# Patient Record
Sex: Female | Born: 1970 | Race: White | Hispanic: No | Marital: Married | Smoking: Never smoker
Health system: Southern US, Community
[De-identification: ages and names within clinical notes are randomized; demographics above are authoritative.]

## PROBLEM LIST (undated history)

## (undated) DIAGNOSIS — K589 Irritable bowel syndrome without diarrhea: Secondary | ICD-10-CM

## (undated) DIAGNOSIS — R519 Headache, unspecified: Secondary | ICD-10-CM

## (undated) DIAGNOSIS — F419 Anxiety disorder, unspecified: Secondary | ICD-10-CM

## (undated) DIAGNOSIS — I209 Angina pectoris, unspecified: Secondary | ICD-10-CM

## (undated) DIAGNOSIS — I10 Essential (primary) hypertension: Secondary | ICD-10-CM

## (undated) DIAGNOSIS — T7840XA Allergy, unspecified, initial encounter: Secondary | ICD-10-CM

## (undated) DIAGNOSIS — R51 Headache: Secondary | ICD-10-CM

## (undated) DIAGNOSIS — K648 Other hemorrhoids: Secondary | ICD-10-CM

## (undated) DIAGNOSIS — D239 Other benign neoplasm of skin, unspecified: Secondary | ICD-10-CM

## (undated) DIAGNOSIS — F909 Attention-deficit hyperactivity disorder, unspecified type: Secondary | ICD-10-CM

## (undated) DIAGNOSIS — Z8041 Family history of malignant neoplasm of ovary: Secondary | ICD-10-CM

## (undated) DIAGNOSIS — K219 Gastro-esophageal reflux disease without esophagitis: Secondary | ICD-10-CM

## (undated) HISTORY — DX: Headache, unspecified: R51.9

## (undated) HISTORY — PX: COLONOSCOPY: SHX174

## (undated) HISTORY — DX: Headache: R51

## (undated) HISTORY — DX: Other hemorrhoids: K64.8

## (undated) HISTORY — DX: Allergy, unspecified, initial encounter: T78.40XA

## (undated) HISTORY — DX: Family history of malignant neoplasm of ovary: Z80.41

## (undated) HISTORY — DX: Irritable bowel syndrome, unspecified: K58.9

## (undated) HISTORY — DX: Anxiety disorder, unspecified: F41.9

## (undated) HISTORY — PX: ABDOMINAL HYSTERECTOMY: SHX81

## (undated) HISTORY — PX: UPPER GASTROINTESTINAL ENDOSCOPY: SHX188

---

## 1998-07-16 ENCOUNTER — Other Ambulatory Visit: Admission: RE | Admit: 1998-07-16 | Discharge: 1998-07-16 | Payer: Self-pay | Admitting: Obstetrics and Gynecology

## 1998-07-16 ENCOUNTER — Ambulatory Visit (HOSPITAL_COMMUNITY): Admission: AD | Admit: 1998-07-16 | Discharge: 1998-07-16 | Payer: Self-pay | Admitting: Obstetrics and Gynecology

## 1998-07-16 ENCOUNTER — Encounter: Payer: Self-pay | Admitting: Obstetrics and Gynecology

## 1998-08-18 ENCOUNTER — Inpatient Hospital Stay (HOSPITAL_COMMUNITY): Admission: AD | Admit: 1998-08-18 | Discharge: 1998-08-18 | Payer: Self-pay | Admitting: Obstetrics and Gynecology

## 1998-08-19 ENCOUNTER — Ambulatory Visit (HOSPITAL_COMMUNITY): Admission: AD | Admit: 1998-08-19 | Discharge: 1998-08-19 | Payer: Self-pay | Admitting: *Deleted

## 1998-08-19 ENCOUNTER — Encounter: Payer: Self-pay | Admitting: *Deleted

## 2000-02-19 ENCOUNTER — Other Ambulatory Visit: Admission: RE | Admit: 2000-02-19 | Discharge: 2000-02-19 | Payer: Self-pay | Admitting: Family Medicine

## 2000-09-22 ENCOUNTER — Other Ambulatory Visit: Admission: RE | Admit: 2000-09-22 | Discharge: 2000-09-22 | Payer: Self-pay | Admitting: Gynecology

## 2001-01-19 ENCOUNTER — Inpatient Hospital Stay (HOSPITAL_COMMUNITY): Admission: RE | Admit: 2001-01-19 | Discharge: 2001-01-21 | Payer: Self-pay | Admitting: Gynecology

## 2004-05-08 ENCOUNTER — Ambulatory Visit (HOSPITAL_BASED_OUTPATIENT_CLINIC_OR_DEPARTMENT_OTHER): Admission: RE | Admit: 2004-05-08 | Discharge: 2004-05-08 | Payer: Self-pay | Admitting: Orthopedic Surgery

## 2004-05-08 HISTORY — PX: OTHER SURGICAL HISTORY: SHX169

## 2004-06-28 ENCOUNTER — Inpatient Hospital Stay: Payer: Self-pay | Admitting: General Surgery

## 2004-07-15 ENCOUNTER — Ambulatory Visit: Payer: Self-pay | Admitting: General Surgery

## 2005-03-20 ENCOUNTER — Ambulatory Visit: Payer: Self-pay | Admitting: Urology

## 2009-04-04 ENCOUNTER — Ambulatory Visit: Payer: Self-pay | Admitting: Otolaryngology

## 2009-05-29 ENCOUNTER — Ambulatory Visit: Payer: Self-pay | Admitting: Internal Medicine

## 2009-09-25 ENCOUNTER — Ambulatory Visit: Payer: Self-pay | Admitting: Internal Medicine

## 2009-09-30 ENCOUNTER — Emergency Department: Payer: Self-pay | Admitting: Emergency Medicine

## 2010-05-26 ENCOUNTER — Emergency Department: Payer: Self-pay | Admitting: Internal Medicine

## 2010-11-25 ENCOUNTER — Emergency Department: Payer: Self-pay | Admitting: Emergency Medicine

## 2012-01-01 ENCOUNTER — Encounter (HOSPITAL_BASED_OUTPATIENT_CLINIC_OR_DEPARTMENT_OTHER): Payer: Self-pay | Admitting: *Deleted

## 2012-01-06 ENCOUNTER — Ambulatory Visit (HOSPITAL_BASED_OUTPATIENT_CLINIC_OR_DEPARTMENT_OTHER): Admission: RE | Admit: 2012-01-06 | Payer: BC Managed Care – PPO | Source: Ambulatory Visit | Admitting: Gynecology

## 2012-01-06 ENCOUNTER — Encounter (HOSPITAL_BASED_OUTPATIENT_CLINIC_OR_DEPARTMENT_OTHER): Admission: RE | Payer: Self-pay | Source: Ambulatory Visit

## 2012-01-06 HISTORY — DX: Other benign neoplasm of skin, unspecified: D23.9

## 2012-01-06 SURGERY — VULVAR LESION
Anesthesia: Moderate Sedation

## 2013-06-28 ENCOUNTER — Ambulatory Visit: Payer: Self-pay | Admitting: Family Medicine

## 2014-12-18 ENCOUNTER — Other Ambulatory Visit: Payer: Self-pay

## 2014-12-18 DIAGNOSIS — Z1231 Encounter for screening mammogram for malignant neoplasm of breast: Secondary | ICD-10-CM

## 2015-01-03 ENCOUNTER — Ambulatory Visit: Payer: Self-pay | Attending: Family Medicine

## 2015-12-09 ENCOUNTER — Encounter: Payer: Self-pay | Admitting: Physician Assistant

## 2015-12-23 ENCOUNTER — Encounter: Payer: Self-pay | Admitting: Physician Assistant

## 2015-12-23 ENCOUNTER — Encounter (INDEPENDENT_AMBULATORY_CARE_PROVIDER_SITE_OTHER): Payer: Self-pay

## 2015-12-23 ENCOUNTER — Ambulatory Visit (INDEPENDENT_AMBULATORY_CARE_PROVIDER_SITE_OTHER): Payer: BLUE CROSS/BLUE SHIELD | Admitting: Physician Assistant

## 2015-12-23 VITALS — BP 106/70 | HR 76 | Ht 66.0 in | Wt 191.6 lb

## 2015-12-23 DIAGNOSIS — R1011 Right upper quadrant pain: Secondary | ICD-10-CM | POA: Diagnosis not present

## 2015-12-23 DIAGNOSIS — R1013 Epigastric pain: Secondary | ICD-10-CM | POA: Diagnosis not present

## 2015-12-23 DIAGNOSIS — K5900 Constipation, unspecified: Secondary | ICD-10-CM | POA: Diagnosis not present

## 2015-12-23 DIAGNOSIS — Z1211 Encounter for screening for malignant neoplasm of colon: Secondary | ICD-10-CM | POA: Diagnosis not present

## 2015-12-23 DIAGNOSIS — Z1212 Encounter for screening for malignant neoplasm of rectum: Secondary | ICD-10-CM

## 2015-12-23 NOTE — Progress Notes (Addendum)
Chief Complaint: Right upper quadrant abdominal pain, dyspepsia and constipation  HPI:  Mrs. Woodliff is a 45 year old Caucasian female with a past medical history of IBS and anxiety who was referred to me by Lavonne Chick, MD for a complaint of right upper quadrant abdominal pain, dyspepsia and constipation. The patient has never been seen in our clinic before.  Review of records shows a colonoscopy completed on 06/11/2010 by Dr. Phylis Bougie for screening and right lower quadrant abdominal pain. Impression at that time was of diverticulosis in the mid sigmoid colon and otherwise normal colonoscopy. Repeat was recommended in 5 years for surveillance. Patient also had an EGD at that time, independent review of report and images shows a medium-sized hiatal hernia, gastritis and a normal nasopharynx. Pathology revealed mild reactive gastropathy. Review of previous notes shows that the patient had an ultrasound on 2/12 which revealed a liver cyst but no gallstones. She also had a CT scan at that time which showed some constipation. The patient was diagnosed with irritable bowel syndrome after colonoscopy and EGD were unrevealing.  Today, the patient tells me that for the past 6-7 months she has had a dull right upper quadrant pain which is at a constant 1-2 /10, this worsens about 30 minutes to an hour after eating and will increase to an 8/10. This will last for an hour to an hour and a half and then slowly go away on its own. She has tried heating pads and other medicines during times of pain and "nothing helps". The patient believes this is increased in frequency over the past couple of months. Over this time, the patient has also been experiencing morning nausea which results in several episodes of vomiting. This has become more frequent as well, at least 4-5 days out of the week. This seems to be worse when the patient has not had a bowel movement in a few days. She does tell me that she suffered from  IBS-D in the past and currently this has changed towards constipation over this same amount of time. Patient tells me that if she has not had a bowel movement a few days she will definitely wake up "nauseated and vomit". The patient also tells me that she "always feels that there is something in her throat", though she denies dysphagia symptoms or anything ever "getting stuck". Patient does have some heartburn 1-2 days a week but denies reflux. She also describes some increase in belching over the past few months.  Patient tells me that she has tried to increase fiber in her diet using 2 doses of Metamucil a day and one dose of MiraLAX a day, she has been doing this for the past 3-4 weeks. She believes this "has helped some", but currently she has not had a bowel movement since Friday and has been doing all of these things. She also tried a colonic 3 weeks ago which made her feel somewhat better for a day and a half. She does believe her nausea and vomiting are related to constipation. Patient tells me that she was on Bentyl remotely a while ago, which helped. She did stop this 2 years ago. Patient also uses a daily probiotic, "true biotic", she has been on this for years.  Patient denies fever, chills, blood in her stool, melena, change in diet, recent travel, increase in stress, recent antibiotics, weight loss, fatigue, anorexia or use of NSAIDs.    Past Medical History:  Diagnosis Date  . Anxiety   .  Hidradenoma VULVAR  . IBS (irritable bowel syndrome)     Past Surgical History:  Procedure Laterality Date  . ABDOMINAL HYSTERECTOMY  01-19-2001  DR LOMAX   W/ LEFT SALPINGO-OOPHORECTOMY AND EXCISION ENDOMETRIOSIS  . CESAREAN SECTION    . REMOVAL OF SIDE PLATE, DISTAL FIBULA, RIGHT ANKLE  05-08-2004    Current Outpatient Prescriptions  Medication Sig Dispense Refill  . estradiol (ESTRACE) 0.5 MG tablet Take 0.5 mg by mouth daily.    . SUMAtriptan (IMITREX) 50 MG tablet Take 50 mg by mouth  every 2 (two) hours as needed for migraine. May repeat in 2 hours if headache persists or recurs.     No current facility-administered medications for this visit.     Allergies as of 12/23/2015 - Review Complete 12/23/2015  Allergen Reaction Noted  . Sulfa antibiotics Hives and Itching 12/23/2015    Family History  Problem Relation Age of Onset  . Ovarian cancer Mother   . Uterine cancer Mother   . Diabetes Father   . Kidney disease Paternal Grandmother   . Esophageal cancer Neg Hx   . Colon cancer Neg Hx   . Stomach cancer Neg Hx   . Pancreatic cancer Neg Hx   . Liver disease Neg Hx     Social History   Social History  . Marital status: Married    Spouse name: N/A  . Number of children: N/A  . Years of education: N/A   Occupational History  . Not on file.   Social History Main Topics  . Smoking status: Never Smoker  . Smokeless tobacco: Never Used  . Alcohol use No  . Drug use: No  . Sexual activity: Not on file   Other Topics Concern  . Not on file   Social History Narrative  . No narrative on file    Review of Systems:     Constitutional: No weight loss, fever, chills, weakness or fatigue HEENT: Eyes: No change in vision               Ears, Nose, Throat:  No change in hearing or congestion Skin: No rash or itching Cardiovascular: No chest pain, chest pressure or palpitations   Respiratory: No SOB or cough Gastrointestinal: See HPI and otherwise negative Genitourinary: No dysuria or change in urinary frequency Neurological: No headache, dizziness or syncope Musculoskeletal: No new muscle or joint pain Hematologic: No bleeding or bruising Psychiatric: Positive history of anxiety No history of depression     Physical Exam:  Vital signs: Ht 5\' 6"  (1.676 m)   Wt 191 lb 9.6 oz (86.9 kg)   BMI 30.93 kg/m   General:   Pleasant Caucasian female appears to be in NAD, Well developed, Well nourished, alert and cooperative Head:  Normocephalic and  atraumatic. Eyes:   PEERL, EOMI. No icterus. Conjunctiva pink. Ears:  Normal auditory acuity. Neck:  Supple Throat: Oral cavity and pharynx without inflammation, swelling or lesion. Teeth in good condition. Lungs: Respirations even and unlabored. Lungs clear to auscultation bilaterally.   No wheezes, crackles, or rhonchi.  Heart: Normal S1, S2. No MRG. Regular rate and rhythm. No peripheral edema, cyanosis or pallor.  Abdomen:  Soft, nondistended, moderate tenderness in the right upper quadrant with some involuntary guarding No rebound. Decreased bowel sounds in all 4 quadrants No appreciable masses or hepatomegaly. Rectal:  Not performed.  Msk:  Symmetrical without gross deformities. Peripheral pulses intact.  Extremities:  Without edema, no deformity or joint abnormality. Normal ROM. Neurologic:  Alert and  oriented x4;  grossly normal neurologically. CN II-XII intact.  Skin:   Dry and intact without significant lesions or rashes. Psychiatric: Oriented to person, place and time. Demonstrates good judgement and reason without abnormal affect or behaviors.  Recent labs by PCP, we do not have a record at time of patient's visit.  Assessment: 1. Right upper quadrant abdominal pain: For the past 6-7 months, worse 30 minutes to an hour after eating, nothing relieves this pain, ultrasound in 2012 unrevealing, CT around that time showed no stones, no history of HIDA scan, EGD 2012 revealed gastritis; consider gallbladder etiology versus constipation versus gastritis versus other 2. Dyspepsia: Patient with nausea and vomiting "most mornings", worse when she is constipated; question relation to constipation versus gastritis versus gallbladder etiology versus IBS/functional dyspepsia 3. Constipation: Recent change for the patient over the past 6-7 months, previously she had diarrhea, no help from 1 dose of MiraLAX qd and addition of fiber supplement; consider IBS C versus other 4. Screening colonoscopy:  Last colonoscopy records from 2012 with diverticulosis and otherwise normal, repeat was recommended in 5 years, we will proceed at this time  Plan: 1. At this time discussed that her symptoms of nausea and vomiting could be related to her recent change to constipation. Recommend she increase fiber in her diet to 25-35 g per day with the use of fiber supplement such as Metamucil, Citrucel or Benefiber. Also recommend she increase water intake to at least 6-8 8 ounce glasses of water per day. She can also increase exercise. 2. We did discuss that she should use MiraLAX at least twice daily, she can use this up to 4 times a day. She should titrate this to regular formed but soft stools. She verbalized understanding. 3. Did provide the patient with information regarding a low FODMAP diet if she would like to try this in the future. We discussed this in detail. 4. Recommend the patient switch probiotics at this time to [place]. She should continue this for at least 2 months. 5. Ordered right upper quadrant ultrasound for further evaluation of gallbladder etiology. If this is normal/negative would recommend a HIDA scan for further evaluation. 6. Ordered colonoscopy for screening purposes, per patient's last colonoscopy report she was due for repeat in 5 years. Also with change in bowel habits recently, this would be good timing. Discussed risks, benefits, limitations and alternatives and the patient agrees to proceed. This was scheduled with Dr. Carlean Purl as the patient is new to our clinic. 7. In the future if ultrasound unrevealing and colonoscopy normal, could consider OTC anti-secretory medication as the patient does have a history of gastritis via EGD in 2012. 8. Patient to follow in clinic 2-3 weeks after upcoming colonoscopy and ultrasound evaluation. This can be with myself or Dr. Carlean Purl. 9. Requested recent labs from PCP.  Thank you for your kind referral, it was a pleasure to see Mrs. Cata this  morning. Please let us know if any of any further help or assistance.  Ellouise Newer, PA-C [facility] 12/23/2015, 9:02 AM  Cc: Lavonne Chick, MD   _______________________________________________________________________________________________ Addendum: Received patient's recent labs drawn 11/11/15 including a CBC with a hemoglobin low at 10.3, MCV low at 73 and otherwise normal. CMP showed a low calcium at 8.3 and was otherwise normal. Lipid panel showed a high LDL at 131 and was otherwise normal. TSH was normal. Hemoglobin A1c was normal. JLL 12/24/15 0855

## 2015-12-23 NOTE — Patient Instructions (Addendum)
You have been scheduled for a colonoscopy. Please follow written instructions given to you at your visit today.  Please pick up your prep supplies at the pharmacy within the next 1-3 days. If you use inhalers (even only as needed), please bring them with you on the day of your procedure. Your physician has requested that you go to www.startemmi.com and enter the access code given to you at your visit today. This web site gives a general overview about your procedure. However, you should still follow specific instructions given to you by our office regarding your preparation for the procedure.  You have been scheduled for an abdominal ultrasound at [facility] (1st floor of hospital) on 12/27/15 at 11:30 am. Please arrive 15 minutes prior to your appointment for registration. Make certain not to have anything to eat or drink 6 hours prior to your appointment. Should you need to reschedule your appointment, please contact radiology at [phone]. This test typically takes about 30 minutes to perform.  We have given you a  Handout on a low FODMAP diet.   We have given you a coupon for [employer].   Please follow up with Ellouise Newer, PA-C or Dr. Carlean Purl after your colonoscopy.

## 2015-12-24 ENCOUNTER — Encounter: Payer: Self-pay | Admitting: Internal Medicine

## 2015-12-24 NOTE — Progress Notes (Signed)
Agree w/ Nancy Freeman's management 

## 2015-12-27 ENCOUNTER — Ambulatory Visit (HOSPITAL_COMMUNITY)
Admission: RE | Admit: 2015-12-27 | Discharge: 2015-12-27 | Disposition: A | Discharge status: Home / Self Care | Payer: BLUE CROSS/BLUE SHIELD | Source: Ambulatory Visit | Attending: Physician Assistant | Admitting: Physician Assistant

## 2015-12-27 DIAGNOSIS — R1011 Right upper quadrant pain: Secondary | ICD-10-CM | POA: Diagnosis present

## 2015-12-30 ENCOUNTER — Ambulatory Visit (AMBULATORY_SURGERY_CENTER): Payer: BLUE CROSS/BLUE SHIELD | Admitting: Internal Medicine

## 2015-12-30 ENCOUNTER — Encounter: Payer: Self-pay | Admitting: Internal Medicine

## 2015-12-30 VITALS — BP 132/77 | HR 63 | Temp 98.7°F | Resp 18 | Ht 66.0 in | Wt 191.0 lb

## 2015-12-30 DIAGNOSIS — Z1212 Encounter for screening for malignant neoplasm of rectum: Secondary | ICD-10-CM

## 2015-12-30 DIAGNOSIS — Z8041 Family history of malignant neoplasm of ovary: Secondary | ICD-10-CM

## 2015-12-30 DIAGNOSIS — K635 Polyp of colon: Secondary | ICD-10-CM | POA: Diagnosis not present

## 2015-12-30 DIAGNOSIS — Z1211 Encounter for screening for malignant neoplasm of colon: Secondary | ICD-10-CM | POA: Diagnosis present

## 2015-12-30 DIAGNOSIS — Z8049 Family history of malignant neoplasm of other genital organs: Secondary | ICD-10-CM

## 2015-12-30 DIAGNOSIS — D125 Benign neoplasm of sigmoid colon: Secondary | ICD-10-CM

## 2015-12-30 MED ORDER — SODIUM CHLORIDE 0.9 % IV SOLN
500.0000 mL | INTRAVENOUS | Status: DC
Start: 2015-12-30 — End: 2016-05-01

## 2015-12-30 NOTE — Progress Notes (Signed)
A/ox3, pleased with MAC, report to RN 

## 2015-12-30 NOTE — Op Note (Addendum)
[facility] Patient Name: Nancy Freeman Procedure Date: 12/30/2015 7:54 AM MRN: [id number] Endoscopist: Gatha Mayer , MD Age: 45 Referring MD:  Date of Birth: [birth year] Gender: Female Account #: [id number] Procedure:                Colonoscopy Indications:              Family history of endometrial cancer in a                            first-degree relative, Family history of ovarian                            cancer in a first-degree relative Medicines:                Propofol per Anesthesia, Monitored Anesthesia Care Procedure:                Pre-Anesthesia Assessment:                           - Prior to the procedure, a History and Physical                            was performed, and patient medications and                            allergies were reviewed. The patient's tolerance of                            previous anesthesia was also reviewed. The risks                            and benefits of the procedure and the sedation                            options and risks were discussed with the patient.                            All questions were answered, and informed consent                            was obtained. Prior Anticoagulants: The patient has                            taken no previous anticoagulant or antiplatelet                            agents. ASA Grade Assessment: II - A patient with                            mild systemic disease. After reviewing the risks                            and benefits, the patient was deemed in  satisfactory condition to undergo the procedure.                           After obtaining informed consent, the colonoscope                            was passed under direct vision. Throughout the                            procedure, the patient's blood pressure, pulse, and                            oxygen saturations were monitored continuously. The                            Model  CF-HQ190L [id number]) scope was introduced                            through the anus and advanced to the the terminal                            ileum, with identification of the appendiceal                            orifice and IC valve. The colonoscopy was performed                            without difficulty. The patient tolerated the                            procedure well. The quality of the bowel                            preparation was excellent. The bowel preparation                            used was Miralax. The terminal ileum, the                            appendiceal orifice and the rectum were                            photographed. Scope In: 8:06:13 AM Scope Out: 8:25:31 AM Scope Withdrawal Time: 0 hours 12 minutes 53 seconds  Total Procedure Duration: 0 hours 19 minutes 18 seconds  Findings:                 The perianal and digital rectal examinations were                            normal.                           Three sessile polyps were found in the sigmoid  colon and distal sigmoid colon. The polyps were 3                            to 4 mm in size. These polyps were removed with a                            cold snare. Resection and retrieval were complete.                            Verification of patient identification for the                            specimen was done. Estimated blood loss was minimal.                           The terminal ileum appeared normal.                           The exam was otherwise without abnormality on                            direct and retroflexion views. Complications:            No immediate complications. Estimated Blood Loss:     Estimated blood loss was minimal. Impression:               - Three 3 to 4 mm polyps in the sigmoid colon and                            in the distal sigmoid colon, removed with a cold                            snare. Resected and retrieved.                            - The examined portion of the ileum was normal.                           - The examination was otherwise normal on direct                            and retroflexion views. Recommendation:           - Patient has a contact number available for                            emergencies. The signs and symptoms of potential                            delayed complications were discussed with the                            patient. Return to normal activities tomorrow.  Written discharge instructions were provided to the                            patient.                           - Resume previous diet.                           - Continue present medications.                           - Await pathology results.                           - Repeat colonoscopy is recommended. The                            colonoscopy date will be determined after pathology                            results from today's exam become available for                            review.                           -                           WILL SCHEDULE HIDA SCAN WITH EJECTION FRACTION TO                            EVALUATE RUQ PAIN - OFFICE WILL ARRANGE THIS Gatha Mayer, MD 12/30/2015 8:36:48 AM This report has been signed electronically.

## 2015-12-30 NOTE — Progress Notes (Signed)
[place] Dr. Carlean Purl in to speak with pt. And aware of pt. Pain and Nausea and informed pt. And husband that he was going to have office to set up [place] Scan to check her gallbladder for her symptoms she is having,they verbalized understanding.

## 2015-12-30 NOTE — Patient Instructions (Addendum)
I found and removed 3 small polyps that have nothing to do with symptoms.  Push the fiber, use MiraLax and you will be set up for a HIDA scan (radiology test) to check for how well your gallbladder functions.  I appreciate the opportunity to care for you. Gatha Mayer, MD, FACG       YOU HAD AN ENDOSCOPIC PROCEDURE TODAY AT [facility]:   Refer to the procedure report that was given to you for any specific questions about what was found during the examination.  If the procedure report does not answer your questions, please call your gastroenterologist to clarify.  If you requested that your care partner not be given the details of your procedure findings, then the procedure report has been included in a sealed envelope for you to review at your convenience later.  YOU SHOULD EXPECT: Some feelings of bloating in the abdomen. Passage of more gas than usual.  Walking can help get rid of the air that was put into your GI tract during the procedure and reduce the bloating. If you had a lower endoscopy (such as a colonoscopy or flexible sigmoidoscopy) you may notice spotting of blood in your stool or on the toilet paper. If you underwent a bowel prep for your procedure, you may not have a normal bowel movement for a few days.  Please Note:  You might notice some irritation and congestion in your nose or some drainage.  This is from the oxygen used during your procedure.  There is no need for concern and it should clear up in a day or so.  SYMPTOMS TO REPORT IMMEDIATELY:   Following lower endoscopy (colonoscopy or flexible sigmoidoscopy):  Excessive amounts of blood in the stool  Significant tenderness or worsening of abdominal pains  Swelling of the abdomen that is new, acute  Fever of 100F or higher   For urgent or emergent issues, a gastroenterologist can be reached at any hour by calling [phone].   DIET:  We do recommend a small meal at first, but then  you may proceed to your regular diet.  Drink plenty of fluids but you should avoid alcoholic beverages for 24 hours.  ACTIVITY:  You should plan to take it easy for the rest of today and you should NOT DRIVE or use heavy machinery until tomorrow (because of the sedation medicines used during the test).    FOLLOW UP: Our staff will call the number listed on your records the next business day following your procedure to check on you and address any questions or concerns that you may have regarding the information given to you following your procedure. If we do not reach you, we will leave a message.  However, if you are feeling well and you are not experiencing any problems, there is no need to return our call.  We will assume that you have returned to your regular daily activities without incident.  If any biopsies were taken you will be contacted by phone or by letter within the next 1-3 weeks.  Please call us at [phone] if you have not heard about the biopsies in 3 weeks.    SIGNATURES/CONFIDENTIALITY: You and/or your care partner have signed paperwork which will be entered into your electronic medical record.  These signatures attest to the fact that that the information above on your After Visit Summary has been reviewed and is understood.  Full responsibility of the confidentiality of this discharge information lies  with you and/or your care-partner.    Resume medications. Information given on polyps.

## 2015-12-31 ENCOUNTER — Telehealth: Payer: Self-pay

## 2015-12-31 ENCOUNTER — Other Ambulatory Visit: Payer: Self-pay

## 2015-12-31 DIAGNOSIS — R1011 Right upper quadrant pain: Secondary | ICD-10-CM

## 2015-12-31 NOTE — Progress Notes (Signed)
Patient is scheduled HIDA at [facility] on 01/13/16 7:15 arrival for 7:30 am appt.  She is advised to be NPO after midnight.

## 2015-12-31 NOTE — Telephone Encounter (Signed)
  Follow up Call-  Call back number 12/30/2015  Post procedure Call Back phone  # [phone]   Permission to leave phone message Yes  Some recent data might be hidden    Patient was called for follow up after her procedure on 12/30/2015. No answer at the number given for follow up phone call. A message was left on the answering machine.

## 2015-12-31 NOTE — Telephone Encounter (Signed)
  Follow up Call-  Call back number 12/30/2015  Post procedure Call Back phone  # [phone]   Permission to leave phone message Yes  Some recent data might be hidden    Patient was called for follow up after her procedure on 12/30/2015. No answer at the number given for follow up phone call. A message was left on the answering machine.

## 2016-01-03 NOTE — Progress Notes (Signed)
Distal sigmoid polyps were hyperplastic so nect colonoscopy 10 yrs 2027  Place recall  Will notify by My Chart

## 2016-01-10 ENCOUNTER — Other Ambulatory Visit: Payer: Self-pay | Admitting: Family Medicine

## 2016-01-10 DIAGNOSIS — Z1231 Encounter for screening mammogram for malignant neoplasm of breast: Secondary | ICD-10-CM

## 2016-01-13 ENCOUNTER — Encounter (HOSPITAL_COMMUNITY): Payer: Self-pay | Admitting: Radiology

## 2016-01-13 ENCOUNTER — Ambulatory Visit (HOSPITAL_COMMUNITY)
Admission: RE | Admit: 2016-01-13 | Discharge: 2016-01-13 | Disposition: A | Discharge status: Home / Self Care | Payer: BLUE CROSS/BLUE SHIELD | Source: Ambulatory Visit | Attending: Internal Medicine | Admitting: Internal Medicine

## 2016-01-13 DIAGNOSIS — R1011 Right upper quadrant pain: Secondary | ICD-10-CM | POA: Insufficient documentation

## 2016-01-13 MED ORDER — TECHNETIUM TC 99M MEBROFENIN IV KIT
5.5000 | PACK | Freq: Once | INTRAVENOUS | Status: AC | PRN
  Administered 2016-01-13: 5.5 via INTRAVENOUS

## 2016-01-13 NOTE — Progress Notes (Signed)
Normal HIDA and Gallbladder EF My Chart note to patient

## 2016-02-21 ENCOUNTER — Ambulatory Visit: Payer: BLUE CROSS/BLUE SHIELD

## 2016-02-24 HISTORY — PX: HEMORRHOID BANDING: SHX5850

## 2016-02-25 ENCOUNTER — Ambulatory Visit
Admission: RE | Admit: 2016-02-25 | Discharge: 2016-02-25 | Disposition: A | Discharge status: Home / Self Care | Payer: BLUE CROSS/BLUE SHIELD | Source: Ambulatory Visit | Attending: Family Medicine | Admitting: Family Medicine

## 2016-02-25 DIAGNOSIS — Z1231 Encounter for screening mammogram for malignant neoplasm of breast: Secondary | ICD-10-CM | POA: Insufficient documentation

## 2016-04-20 ENCOUNTER — Encounter: Payer: Self-pay | Admitting: Physician Assistant

## 2016-05-01 ENCOUNTER — Encounter: Payer: Self-pay | Admitting: Internal Medicine

## 2016-05-01 ENCOUNTER — Ambulatory Visit (INDEPENDENT_AMBULATORY_CARE_PROVIDER_SITE_OTHER): Payer: BLUE CROSS/BLUE SHIELD | Admitting: Internal Medicine

## 2016-05-01 VITALS — BP 110/84 | HR 72 | Ht 66.0 in | Wt 189.4 lb

## 2016-05-01 DIAGNOSIS — K589 Irritable bowel syndrome without diarrhea: Secondary | ICD-10-CM | POA: Diagnosis not present

## 2016-05-01 DIAGNOSIS — K641 Second degree hemorrhoids: Secondary | ICD-10-CM | POA: Diagnosis not present

## 2016-05-01 NOTE — Patient Instructions (Signed)
HEMORRHOID BANDING PROCEDURE    FOLLOW-UP CARE   1. The procedure you have had should have been relatively painless since the banding of the area involved does not have nerve endings and there is no pain sensation.  The rubber band cuts off the blood supply to the hemorrhoid and the band may fall off as soon as 48 hours after the banding (the band may occasionally be seen in the toilet bowl following a bowel movement). You may notice a temporary feeling of fullness in the rectum which should respond adequately to plain Tylenol or Motrin.  2. Following the banding, avoid strenuous exercise that evening and resume full activity the next day.  A sitz bath (soaking in a warm tub) or bidet is soothing, and can be useful for cleansing the area after bowel movements.     3. To avoid constipation, take two tablespoons of natural wheat bran, natural oat bran, flax, Benefiber or any over the counter fiber supplement and increase your water intake to 7-8 glasses daily.    4. Unless you have been prescribed anorectal medication, do not put anything inside your rectum for two weeks: No suppositories, enemas, fingers, etc.  5. Occasionally, you may have more bleeding than usual after the banding procedure.  This is often from the untreated hemorrhoids rather than the treated one.  Don't be concerned if there is a tablespoon or so of blood.  If there is more blood than this, lie flat with your bottom higher than your head and apply an ice pack to the area. If the bleeding does not stop within a half an hour or if you feel faint, call our office at [phone] or go to the emergency room.  6. Problems are not common; however, if there is a substantial amount of bleeding, severe pain, chills, fever or difficulty passing urine (very rare) or other problems, you should call us at [phone] or report to the nearest emergency room.  7. Do not stay seated continuously for more than 2-3 hours for a day or two  after the procedure.  Tighten your buttock muscles 10-15 times every two hours and take 10-15 deep breaths every 1-2 hours.  Do not spend more than a few minutes on the toilet if you cannot empty your bowel; instead re-visit the toilet at a later time.    Continue FODMAP diet.   Today we are giving you a month's supply of VSL #3 , take one a day.   We will see you back for additional banding on    I appreciate the opportunity to care for you. Silvano Rusk, MD, [facility]

## 2016-05-01 NOTE — Progress Notes (Signed)
   Nancy Freeman 46 y.o. 1970/09/04 [id number]  Assessment & Plan:   Encounter Diagnoses  Name Primary?  . Prolapsed internal hemorrhoids, grade 2 Yes  . Irritable bowel syndrome    Banding begun RTC 2 weeks or so more banding VSL # 3 1/day and continue FODMAPS  I appreciate the opportunity to care for this patient.  ZM:CEYEMV, Terance Hart, MD   Subjective:   Chief Complaint: rectal bleeding  HPI Hx hemorrhoids and is having increase in rectal bleeding and some mucous dc No rectal pain Has IBS and bloating and altered bowel habits better on FODMAPS Asking about starting VSL #3 again as has used in past 12/2015 colonoscopy distal hyperplastic polyp  Medications, allergies, past medical history, past surgical history, family history and social history are reviewed and updated in the EMR.  Review of Systems As above  Objective:   Physical Exam BP 110/84   Pulse 72   Ht 5\' 6"  (1.676 m)   Wt 189 lb 6 oz (85.9 kg)   BMI 30.57 kg/m   Female staff present  Rectal - NL anoderm, nontender no mass   Anoscopy was performed with the patient in the left lateral decubitus position while a chaperone was present and revealed Gr 2 internal hemorrhoids all 3 positions w/ moderate inflammation  PROCEDURE NOTE: The patient presents with symptomatic grade 2  hemorrhoids, requesting rubber band ligation of his/her hemorrhoidal disease.  All risks, benefits and alternative forms of therapy were described and informed consent was obtained.   The anorectum was pre-medicated with 0.125% NTG and 5% lidocaine The decision was made to band the RA internal hemorrhoid, and the [place] was used to perform band ligation without complication.  Digital anorectal examination was then performed to assure proper positioning of the band, and to adjust the banded tissue as required.  The patient was discharged home without pain or other issues.  Dietary and behavioral  recommendations were given and along with follow-up instructions.     The following adjunctive treatments were recommended:  VSL #3, continue FODMAPS  The patient will return in about 2 weeks for  follow-up and possible additional banding as required. No complications were encountered and the patient tolerated the procedure well.

## 2016-05-02 ENCOUNTER — Encounter: Payer: Self-pay | Admitting: Internal Medicine

## 2016-05-19 ENCOUNTER — Encounter: Payer: Self-pay | Admitting: Internal Medicine

## 2016-05-19 ENCOUNTER — Ambulatory Visit (INDEPENDENT_AMBULATORY_CARE_PROVIDER_SITE_OTHER): Payer: BLUE CROSS/BLUE SHIELD | Admitting: Internal Medicine

## 2016-05-19 DIAGNOSIS — K641 Second degree hemorrhoids: Secondary | ICD-10-CM

## 2016-05-19 NOTE — Patient Instructions (Addendum)
HEMORRHOID BANDING PROCEDURE    FOLLOW-UP CARE   1. The procedure you have had should have been relatively painless since the banding of the area involved does not have nerve endings and there is no pain sensation.  The rubber band cuts off the blood supply to the hemorrhoid and the band may fall off as soon as 48 hours after the banding (the band may occasionally be seen in the toilet bowl following a bowel movement). You may notice a temporary feeling of fullness in the rectum which should respond adequately to plain Tylenol or Motrin.  2. Following the banding, avoid strenuous exercise that evening and resume full activity the next day.  A sitz bath (soaking in a warm tub) or bidet is soothing, and can be useful for cleansing the area after bowel movements.     3. To avoid constipation, take two tablespoons of natural wheat bran, natural oat bran, flax, Benefiber or any over the counter fiber supplement and increase your water intake to 7-8 glasses daily.    4. Unless you have been prescribed anorectal medication, do not put anything inside your rectum for two weeks: No suppositories, enemas, fingers, etc.  5. Occasionally, you may have more bleeding than usual after the banding procedure.  This is often from the untreated hemorrhoids rather than the treated one.  Don't be concerned if there is a tablespoon or so of blood.  If there is more blood than this, lie flat with your bottom higher than your head and apply an ice pack to the area. If the bleeding does not stop within a half an hour or if you feel faint, call our office at [phone] or go to the emergency room.  6. Problems are not common; however, if there is a substantial amount of bleeding, severe pain, chills, fever or difficulty passing urine (very rare) or other problems, you should call us at [phone] or report to the nearest emergency room.  7. Do not stay seated continuously for more than 2-3 hours for a day or two  after the procedure.  Tighten your buttock muscles 10-15 times every two hours and take 10-15 deep breaths every 1-2 hours.  Do not spend more than a few minutes on the toilet if you cannot empty your bowel; instead re-visit the toilet at a later time.    Give it 2 months and see how your doing.  Call us back if any more problems.     I appreciate the opportunity to care for you. Silvano Rusk, MD, [facility]

## 2016-05-19 NOTE — Assessment & Plan Note (Signed)
Right posterior and left lateral banded return as needed continue FODMAPS  VSL #3 for IBS

## 2016-05-19 NOTE — Progress Notes (Signed)
    PROCEDURE NOTE: The patient presents with symptomatic grade 2 hemorrhoids, requesting rubber band ligation of his/her hemorrhoidal disease.  All risks, benefits and alternative forms of therapy were described and informed consent was obtained.   The anorectum was pre-medicated with 0.125% nitroglycerin and 5% lidocaine topical The decision was made to band the RP and LL internal hemorrhoid, and the [place] was used to perform band ligation without complication.  Digital anorectal examination was then performed to assure proper positioning of the band, and to adjust the banded tissue as required.  The patient was discharged home without pain or other issues.  Dietary and behavioral recommendations were given and along with follow-up instructions.     The following adjunctive treatments were recommended:  Continue VSL #3 and FODMAPS diets  The patient will return prn for  follow-up and possible additional banding as required. No complications were encountered and the patient tolerated the procedure well.  I appreciate the opportunity to care for this patient. CC: Lavonne Chick, MD

## 2016-06-30 ENCOUNTER — Encounter: Payer: Self-pay | Admitting: Neurology

## 2016-06-30 ENCOUNTER — Ambulatory Visit (INDEPENDENT_AMBULATORY_CARE_PROVIDER_SITE_OTHER): Payer: BLUE CROSS/BLUE SHIELD | Admitting: Neurology

## 2016-06-30 VITALS — BP 128/80 | HR 78 | Resp 16 | Ht 66.0 in | Wt 188.0 lb

## 2016-06-30 DIAGNOSIS — G43119 Migraine with aura, intractable, without status migrainosus: Secondary | ICD-10-CM | POA: Diagnosis not present

## 2016-06-30 DIAGNOSIS — R0683 Snoring: Secondary | ICD-10-CM | POA: Diagnosis not present

## 2016-06-30 DIAGNOSIS — R400 Somnolence: Secondary | ICD-10-CM

## 2016-06-30 DIAGNOSIS — G43109 Migraine with aura, not intractable, without status migrainosus: Secondary | ICD-10-CM

## 2016-06-30 DIAGNOSIS — R419 Unspecified symptoms and signs involving cognitive functions and awareness: Secondary | ICD-10-CM | POA: Diagnosis not present

## 2016-06-30 DIAGNOSIS — R51 Headache: Secondary | ICD-10-CM

## 2016-06-30 DIAGNOSIS — E669 Obesity, unspecified: Secondary | ICD-10-CM | POA: Diagnosis not present

## 2016-06-30 DIAGNOSIS — R519 Headache, unspecified: Secondary | ICD-10-CM

## 2016-06-30 MED ORDER — ONDANSETRON 4 MG PO TBDP: 4.0000 mg | ORAL_TABLET | Freq: Three times a day (TID) | ORAL | 0 refills | Status: DC | PRN

## 2016-06-30 NOTE — Progress Notes (Signed)
Subjective:    Patient ID: Nancy Freeman is a 46 y.o. female.  HPI     Nancy Age, MD, PhD [facility] [address], [address] [place], [place] [address]  Dear Dr. Kerin Ransom,   I saw your patient, Nancy Freeman, upon your kind request in my neurologic clinic today for initial consultation of her recurrent headaches and concern for visual field defect. The patient is uaccompanied today. As you know, Nancy Freeman is a 46 year old right-handed woman with an underlying medical history of irritable bowel syndrome, anxiety, allergies, and obesity, who reports a migraine episode on 06/23/2016 associated with decrease in vision and also intermittent tunnel vision in the recent few weeks or months. She has a prior history of migraine headaches without aura. I reviewed your eye examination note from 06/24/2016. Overall eye exam was unremarkable, she was started on Imitrex as needed, which has been helpful. She currently feels at baseline with respect her physician and also headache. She has a history of being sensitive to light. This is an ongoing issue, not currently worse. Her migraine history dates back to her early 36s. In 1996 she started Imitrex injections which were very helpful. She has a strong family history of migraines in her mother who passed away about 38 years ago, middle brother who has migraines and her only daughter also has migraines. She has an older brother who has a history of A. fib. Of note, in September 2017 she started weight loss medication, Contrave, and while she has been able to lose about 22 pounds in the past 6-7 months, she has noted an increase in her migraine frequency. She has up to 2-3 migraine headaches per week at this time which is a significant increase in her migraines, she cannot pinpoint any other triggers or contributors. In the past, she has looked into food triggers and kept a diary for that. She has never been on  headache prevention as her headache frequency was not high enough. She may have had a brain scan in the distant past. She did have a head CT in 2011 which was negative. Visual auras have been new to her headache history, she has had visual field restrictions while driving, not associated with a headache but when she had visual field loss recently she did end up having a headache afterwards. Her visual field impairment lasted for approximately a day and a half she estimates. She has woken up with headaches as well. She is fearful of waking up with headaches in the middle of the night or early morning currently and has even gone to sleep after taking Aleve to prevent morning headaches. Of note, she snores, she does not always wake up rested, she has daytime sleepiness, sometimes takes a nap on weekends. She has reduced her caffeine intake and does not drink daily soda or caffeine. She is a nonsmoker and does not typically drink any alcohol. She is married and lives with her husband, they have 1 daughter. She works for [employer]. She has woken herself up with a sense of gasping or choking. She does not have night to night nocturia. Bedtime is around 10:30 and wake up time usually between 5:30 and 6:30 AM. Of note, her migraine is associated with photophobia, nausea and vomiting. She currently does not have any nausea medication available, in the past, she used Phenergan suppository. She feels that the Imitrex is effective but has taken a second pill recently as well.  Her  Past Medical History Is Significant For: Past Medical History:  Diagnosis Date  . Allergy   . Anxiety   . Headache   . Hidradenoma VULVAR  . IBS (irritable bowel syndrome)   . Internal hemorrhoid     Her Past Surgical History Is Significant For: Past Surgical History:  Procedure Laterality Date  . ABDOMINAL HYSTERECTOMY  01-19-2001  DR LOMAX   W/ LEFT SALPINGO-OOPHORECTOMY AND EXCISION ENDOMETRIOSIS  . CESAREAN SECTION    .  COLONOSCOPY    . HEMORRHOID BANDING  2018  . REMOVAL OF SIDE PLATE, DISTAL FIBULA, RIGHT ANKLE  05-08-2004  . UPPER GASTROINTESTINAL ENDOSCOPY      Her Family History Is Significant For: Family History  Problem Relation Freeman of Onset  . Ovarian cancer Mother   . Uterine cancer Mother   . Breast cancer Mother 21  . Diabetes Father   . Kidney disease Paternal Grandmother   . Breast cancer Maternal Aunt 60  . Esophageal cancer Neg Hx   . Colon cancer Neg Hx   . Stomach cancer Neg Hx   . Pancreatic cancer Neg Hx   . Liver disease Neg Hx   . Rectal cancer Neg Hx     Her Social History Is Significant For: Social History   Social History  . Marital status: Married    Spouse name: N/A  . Number of children: 1  . Years of education: BS   Occupational History  . [employer]     she recruits campers   Social History Main Topics  . Smoking status: Never Smoker  . Smokeless tobacco: Never Used  . Alcohol use No  . Drug use: No  . Sexual activity: Not Asked   Other Topics Concern  . 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 Sulfa Antibiotics Hives and Itching  :   Her Current Medications Are:  Outpatient Encounter Prescriptions as of 06/30/2016  Medication Sig  . cetirizine (ZYRTEC) 10 MG tablet Take 10 mg by mouth daily.  Marland Kitchen estradiol (ESTRACE) 0.5 MG tablet Take 0.5 mg by mouth daily.  . Multiple Vitamin (MULTIVITAMIN) capsule Take 1 capsule by mouth daily.  . Naltrexone-Bupropion HCl ER (CONTRAVE) 8-90 MG TB12 Take by mouth 2 (two) times daily.  . SUMAtriptan (IMITREX) 50 MG tablet Take 50 mg by mouth every 2 (two) hours as needed for migraine. May repeat in 2 hours if headache persists or recurs.  . [DISCONTINUED] Probiotic Product (VSL#3 PO) Take 1 capsule by mouth daily.   No facility-administered encounter medications on file as  of 06/30/2016.   :  Review of Systems:  Out of a complete 14 point review of systems, all are reviewed and negative with the exception of these symptoms as listed below:  Review of Systems  Neurological:       Patient reports that she has had migraines for years. Recently had a migraine that caused her to lose vision in L eye. States that her migraines are increasing in frequency.     Objective:  Neurologic Exam  Physical Exam Physical Examination:   Vitals:   06/30/16 1453  BP: 128/80  Pulse: 78  Resp: 16   General Examination: The patient is a very pleasant 46 y.o. female in no acute distress. She appears well-developed and well-nourished and well groomed.  HEENT: Normocephalic, atraumatic, pupils are equal, round and reactive to light and accommodation. Funduscopic exam is normal with sharp disc margins noted. Extraocular tracking is good without limitation to gaze excursion or nystagmus noted. Normal smooth pursuit is noted. Hearing is grossly intact. Face is symmetric with normal facial animation and normal facial sensation. Speech is clear with no dysarthria noted. There is no hypophonia. There is no lip, neck/head, jaw or voice tremor. Neck is supple with full range of passive and active motion. There are no carotid bruits on auscultation. Oropharynx exam reveals: mild mouth dryness, good dental hygiene and moderate airway crowding, due to smaller airway entry, tonsillar size of about 2+ bilaterally, uvula larger as well. Mallampati is class II. Neck circumference is 14-1/4 inches. Tongue is central, palate elevates symmetrically.  Chest: Clear to auscultation without wheezing, rhonchi or crackles noted.  Heart: S1+S2+0, regular and normal without murmurs, rubs or gallops noted.   Abdomen: Soft, non-tender and non-distended with normal bowel sounds appreciated on auscultation.  Extremities: There is no pitting edema in the distal lower extremities bilaterally. Pedal pulses are  intact.  Skin: Warm and dry without trophic changes noted.  Musculoskeletal: exam reveals no obvious joint deformities, tenderness or joint swelling or erythema.   Neurologically:  Mental status: The patient is awake, alert and oriented in all 4 spheres. Her immediate and remote memory, attention, language skills and fund of knowledge are appropriate. There is no evidence of aphasia, agnosia, apraxia or anomia. Speech is clear with normal prosody and enunciation. Thought process is linear. Mood is normal and affect is normal.  Cranial nerves II - XII are as described above under HEENT exam. In addition: shoulder shrug is normal with equal shoulder height noted. Motor exam: Normal bulk, strength and tone is noted. There is no drift, tremor or rebound. Romberg is negative. Reflexes are 2+ throughout. Babinski: Toes are flexor bilaterally. Fine motor skills and coordination: intact with normal finger taps, normal hand movements, normal rapid alternating patting, normal foot taps and normal foot agility.  Cerebellar testing: No dysmetria or intention tremor on finger to nose testing. Heel to shin is unremarkable bilaterally. There is no truncal or gait ataxia.  Sensory exam: intact to light touch, vibration, temperature sense in the upper and lower extremities.  Gait, station and balance: She stands easily. No veering to one side is noted. No leaning to one side is noted. Posture is Freeman-appropriate and stance is narrow based. Gait shows normal stride length and normal pace. No problems turning are noted. Tandem walk is unremarkable.             Assessment and Plan:    In summary, Nancy Freeman is a very pleasant 46 y.o.-year old female with an underlying medical history of irritable bowel syndrome, anxiety, allergies, and obesity, who presents for initial consultation of her migraine headaches. She has a long-standing history of migraine headaches since her early 87s, a strong family history of  migraines as well. She has in the recent past experience any significant increase in her headache frequency and also visual auras as well as auras without an associated headache. She has correlated the increase in her headache frequency with a new weight loss medication she started about 6 or 7 months ago. She is encouraged to talk to her primary care physician about potentially tapering off of the Contrave. She has a nonfocal exam, which is reassuring. Nevertheless, we do not have an MRI on file and given the increase in  headache frequency and change in the fact that she has had visual auras, I would like to proceed with a brain MRI without contrast. Of note, her history and physical exam are also concerning for underlying obstructive sleep apnea which could be a contributor to morning headaches and worsening migraines as well.  We talked about headache management including abortive treatment and preventative treatment quite a bit today. She is advised that we will wait out until she is able to come off of the weight loss medication. In the interim, we will do MRI testing and sleep study testing which I ordered today. Furthermore, I will prescribe Zofran generic for as needed use for nausea and vomiting. She has a prescription available for Imitrex 50 g strength for abortive treatment which has been helpful, we may increase the dose down the Road to 100 mg strength pills. For headache prevention, we may utilize topiramate down the [place]. This may help her with her weight management as well. Of note, she has noted some sluggishness in her thinking and short-term memory issues. We have to be mindful of this when we start topiramate. Nevertheless, we will wait things out a little longer at this time until she has a chance to discuss with her primary care physician the possibility of coming off of her weight loss medication. We will see her back after her testing is completed. I answered all her questions today and she  was in agreement. Thank you very much for allowing me to participate in the care of this nice patient. If I can be of any further assistance to you please do not hesitate to call me at [phone].  Sincerely,   Nancy Age, MD, PhD

## 2016-06-30 NOTE — Patient Instructions (Signed)
When we talk about headache management, we usually break it up in abortive treatment, meaning acute headache treatment versus headache prevention, which requires taking medication on a daily basis. There are many medications available for acute management as well as for headache prevention. Not everything works the same way for everybody and sometimes we go through a period of trial and her. Unfortunately, no medication is without potential side effects. Some people require only as needed medication and some patients when they suffer from frequent headaches and very debilitating headaches require both acute treatment and preventative medication.  Let's see, how things go after you taper the Contrave - talk to Dr. Carlene Coria office about it.   I will give you a prescription for nausea medication.   We may consider a medication called Topamax for headache prevention.   You can continue with the Imitrex as needed for acute migraine attacks.   Please remember, common headache triggers are: sleep deprivation, dehydration, overheating, stress, hypoglycemia or skipping meals and blood sugar fluctuations, excessive pain medications or excessive alcohol use or caffeine withdrawal. Some people have food triggers such as aged cheese, orange juice or chocolate, especially dark chocolate, or MSG (monosodium glutamate). Try to avoid these headache triggers as much possible. It may be helpful to keep a headache diary to figure out what makes your headaches worse or brings them on and what alleviates them. Some people report headache onset after exercise but studies have shown that regular exercise may actually prevent headaches from coming. If you have exercise-induced headaches, please make sure that you drink plenty of fluid before and after exercising and that you do not over do it and do not overheat.  Based on your symptoms and your exam I believe you are at risk for obstructive sleep apnea or OSA, and I think we  should proceed with a sleep study to determine whether you do or do not have OSA and how severe it is. If you have more than mild OSA, I want you to consider treatment with CPAP. Please remember, the risks and ramifications of moderate to severe obstructive sleep apnea or OSA are: Cardiovascular disease, including congestive heart failure, stroke, difficult to control hypertension, arrhythmias, and even type 2 diabetes has been linked to untreated OSA. Sleep apnea causes disruption of sleep and sleep deprivation in most cases, which, in turn, can cause recurrent headaches, problems with memory, mood, concentration, focus, and vigilance. Most people with untreated sleep apnea report excessive daytime sleepiness, which can affect their ability to drive. Please do not drive if you feel sleepy.   I will likely see you back after your sleep study to go over the test results and where to go from there. We will call you after your sleep study to advise about the results (most likely, you will hear from Beverlee Nims, my nurse) and to set up an appointment at the time, as necessary.    Our sleep lab administrative assistant, Arrie Aran will meet with you or call you to schedule your sleep study. If you don't hear back from her by next week please feel free to call her at [phone]. This is her direct line and please leave a message with your phone number to call back if you get the voicemail box. She will call back as soon as possible.

## 2016-07-07 ENCOUNTER — Telehealth: Payer: Self-pay | Admitting: Neurology

## 2016-07-07 DIAGNOSIS — E669 Obesity, unspecified: Secondary | ICD-10-CM

## 2016-07-07 DIAGNOSIS — R0683 Snoring: Secondary | ICD-10-CM

## 2016-07-07 DIAGNOSIS — G43109 Migraine with aura, not intractable, without status migrainosus: Secondary | ICD-10-CM

## 2016-07-07 DIAGNOSIS — R419 Unspecified symptoms and signs involving cognitive functions and awareness: Secondary | ICD-10-CM

## 2016-07-07 DIAGNOSIS — R51 Headache: Secondary | ICD-10-CM

## 2016-07-07 DIAGNOSIS — R519 Headache, unspecified: Secondary | ICD-10-CM

## 2016-07-07 DIAGNOSIS — R400 Somnolence: Secondary | ICD-10-CM

## 2016-07-07 NOTE — Telephone Encounter (Signed)
[employer] denied split sleep study but approved HST.  Can I get an order for HST?

## 2016-07-08 ENCOUNTER — Ambulatory Visit (INDEPENDENT_AMBULATORY_CARE_PROVIDER_SITE_OTHER): Payer: BLUE CROSS/BLUE SHIELD

## 2016-07-08 ENCOUNTER — Encounter: Payer: Self-pay | Admitting: Cardiology

## 2016-07-08 ENCOUNTER — Ambulatory Visit (INDEPENDENT_AMBULATORY_CARE_PROVIDER_SITE_OTHER): Payer: BLUE CROSS/BLUE SHIELD | Admitting: Cardiology

## 2016-07-08 VITALS — BP 110/80 | HR 62 | Ht 66.0 in | Wt 191.2 lb

## 2016-07-08 DIAGNOSIS — E669 Obesity, unspecified: Secondary | ICD-10-CM

## 2016-07-08 DIAGNOSIS — G43119 Migraine with aura, intractable, without status migrainosus: Secondary | ICD-10-CM | POA: Diagnosis not present

## 2016-07-08 DIAGNOSIS — R419 Unspecified symptoms and signs involving cognitive functions and awareness: Secondary | ICD-10-CM | POA: Diagnosis not present

## 2016-07-08 DIAGNOSIS — R0683 Snoring: Secondary | ICD-10-CM | POA: Diagnosis not present

## 2016-07-08 DIAGNOSIS — R55 Syncope and collapse: Secondary | ICD-10-CM

## 2016-07-08 DIAGNOSIS — R400 Somnolence: Secondary | ICD-10-CM

## 2016-07-08 DIAGNOSIS — R51 Headache: Secondary | ICD-10-CM

## 2016-07-08 DIAGNOSIS — I491 Atrial premature depolarization: Secondary | ICD-10-CM

## 2016-07-08 DIAGNOSIS — R002 Palpitations: Secondary | ICD-10-CM

## 2016-07-08 DIAGNOSIS — R519 Headache, unspecified: Secondary | ICD-10-CM

## 2016-07-08 DIAGNOSIS — G43109 Migraine with aura, not intractable, without status migrainosus: Secondary | ICD-10-CM

## 2016-07-08 NOTE — Progress Notes (Signed)
Cardiology Office Note:    Date:  07/08/2016   ID:  Nancy Freeman, DOB 06/03/1970, MRN [id number]  PCP:  Lavonne Chick, MD  Cardiologist:  Candee Furbish, MD   Referring MD: Lavonne Chick, MD     History of Present Illness:    Nancy Freeman is a 46 y.o. female here for evaluation of palpitations at the request of Dr. Tobin Chad.  In review of prior notes, she was having irregular heartbeat. She had a description of near syncope while driving. Blurry vision, weakness intense pressure had. Feels drained, to 3 times a week. Has lorazepam for panic attacks but this doesn't seem to help. After she lays down and goes to sleep he goes away. She had a stress test done in May 2016 in [facility] that was reassuring. This does not feel like her typical panic attacks. She also has migraines and has just recently seen neurology. In her early 85s she had several syncopal episodes in the Holter monitor noted irregular heartbeat. 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  She has recalls that her blood pressure had spiked, ears resting.  Lab work: White count 6 hemoglobin 10.3 hematocrit 33.1, platelets 229, MCV 73-possible iron deficiency anemia, creatinine 0.8, potassium 4.1, albumin 3.8 total cholesterol 193, LDL 131, HDL 44, TSH 3.1, hemoglobin A1c 5.6  Echo report from 12/22/2007 showed normal EF, normal valvular function.  I reviewed her consultation from Dr. Alycia Rossetti from 11/2007 for palpitations. She noted the palpitations mostly when lying down. No chest pain. No heart failure symptoms. Lab work was normal at that time. EKG at that time showed T-wave inversion inferiorly otherwise normal with no QT interval prolongation. Echo was normal, no LVH. She was diagnosed with PACs and conservative therapy was warranted. She placed on metoprolol PID as this may help with both migraine and PACs as well.  Beats come and go for years. She cut back on caffeine and  stimulant medication and still notices her palpitations periodically especially in the evening hours when relaxing. Occasional pounds after skipped. No continuous fast heartbeats however. Her brother has atrial fibrillation.  She also describes to me episodes of vasovagal type reaction with nausea, sweats, tunnel vision, tinnitus, near syncope as described above is well.  Past Medical History:  Diagnosis Date  . Allergy   . Anxiety   . Headache   . Hidradenoma VULVAR  . IBS (irritable bowel syndrome)   . Internal hemorrhoid     Past Surgical History:  Procedure Laterality Date  . ABDOMINAL HYSTERECTOMY  01-19-2001  DR LOMAX   W/ LEFT SALPINGO-OOPHORECTOMY AND EXCISION ENDOMETRIOSIS  . CESAREAN SECTION    . COLONOSCOPY    . HEMORRHOID BANDING  2018  . REMOVAL OF SIDE PLATE, DISTAL FIBULA, RIGHT ANKLE  05-08-2004  . UPPER GASTROINTESTINAL ENDOSCOPY      Current Medications: Current Meds  Medication Sig  . cetirizine (ZYRTEC) 10 MG tablet Take 10 mg by mouth daily.  Marland Kitchen estradiol (ESTRACE) 0.5 MG tablet Take 0.5 mg by mouth daily.  . Multiple Vitamin (MULTIVITAMIN) capsule Take 1 capsule by mouth daily.  . Naltrexone-Bupropion HCl ER (CONTRAVE) 8-90 MG TB12 Take by mouth 2 (two) times daily.  . ondansetron (ZOFRAN-ODT) 4 MG disintegrating tablet Take 1 tablet (4 mg total) by mouth every 8 (eight) hours as needed for nausea or vomiting.  . SUMAtriptan (IMITREX) 50 MG tablet Take 50 mg by mouth every 2 (two) hours as needed for migraine. May  repeat in 2 hours if headache persists or recurs.     Allergies:   Sulfa antibiotics   Social History   Social History  . Marital status: Married    Spouse name: N/A  . Number of children: 1  . Years of education: BS   Occupational History  . [employer]     she recruits campers   Social History Main Topics  . Smoking status: Never Smoker  . Smokeless tobacco: Never Used  . Alcohol use No  . Drug use: No  . Sexual activity:  Not Asked   Other Topics Concern  . 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 There is no history of Esophageal cancer, Colon cancer, Stomach cancer, Pancreatic cancer, Liver disease, or Rectal cancer.   ROS:   Please see the history of present illness.     All other systems reviewed and are negative.  EKGs/Labs/Other Studies Reviewed:    The following studies were today: Prior lab work, office work, echo  EKG:  EKG is ordered today.  The ekg ordered today demonstrates 07/08/16-sinus rhythm 62 with no other abnormalities.  Recent Labs: No results found for requested labs within last 8760 hours.   Recent Lipid Panel No results found for: CHOL, TRIG, HDL, CHOLHDL, VLDL, LDLCALC, LDLDIRECT  Physical Exam:    VS:  BP 110/80 (BP Location: Left Arm, Patient Position: Sitting, Cuff Size: Large)   Pulse 62   Ht 5\' 6"  (1.676 m)   Wt 191 lb 3.2 oz (86.7 kg)   SpO2 97%   BMI 30.86 kg/m     Wt Readings from Last 3 Encounters:  07/08/16 191 lb 3.2 oz (86.7 kg)  06/30/16 188 lb (85.3 kg)  05/19/16 192 lb 6 oz (87.3 kg)     GEN:  Well nourished, well developed in no acute distress HEENT: Normal NECK: No JVD; No carotid bruits LYMPHATICS: No lymphadenopathy CARDIAC: RRR, no murmurs, rubs, gallops RESPIRATORY:  Clear to auscultation without rales, wheezing or rhonchi  ABDOMEN: Soft, non-tender, non-distended MUSCULOSKELETAL:  No edema; No deformity  SKIN: Warm and dry NEUROLOGIC:  Alert and oriented x 3 PSYCHIATRIC:  Normal affect   ASSESSMENT:    1. Vasovagal response   2. Palpitations   3. PAC (premature atrial contraction)    PLAN:    In order of  problems listed above:  Vasovagal reaction  - Classic type symptoms such as nausea, feeling of out of body, dizziness, diaphoresis.  - Discussed measures such as laying down which he already does at times, salt liberalization as well as fluid liberalization.  - This is a benign condition but can be worrisome and could lead to harm if she falls or injures herself.  Palpitations/PACs  - Fairly long-standing history. Usually occur at night when relaxing. Fairly classic symptoms for PACs or PVCs. Prior echocardiogram was reassuring.  She will let us know if.she is having further worsening symptoms.   Medication Adjustments/Labs and Tests Ordered: Current medicines are reviewed at length with the patient today.  Concerns regarding medicines are outlined above. Labs and tests ordered and medication changes are outlined in the patient instructions below:  Patient Instructions  Medication Instructions:  Your physician recommends that you continue on your current medications as directed. Please refer to the Current Medication list given to you today.   Labwork: None Ordered   Testing/Procedures: None Ordered   Follow-Up: Your physician recommends that you schedule a follow-up appointment in: as needed with Dr. Marlou Porch   If you need a refill on your cardiac medications before your next appointment, please call your pharmacy.   Thank you for choosing [facility]! Christen Bame, RN [phone]     Vasovagal Syncope, Adult Syncope, which is commonly known as fainting or passing out, is a temporary loss of consciousness. It occurs when the blood flow to the brain is reduced. Vasovagal syncope, also called neurocardiogenic syncope, is a fainting spell that happens when blood flow to the brain is reduced because of a sudden drop in heart rate and blood pressure. Vasovagal syncope is usually harmless. However, you can get injured if you fall during a fainting spell. What are the  causes? This condition is caused by a drop in heart rate and blood pressure, usually in response to a trigger. Many things and situations can trigger an episode, including:  Pain.  Fear.  The sight of blood. This may occur during medical procedures, such as when blood is being drawn from a vein.  Common activities, such as coughing, swallowing, stretching, or going to the bathroom.  Emotional stress.  Being in a confined space.  Prolonged standing, especially in a warm environment.  Lack of sleep or rest.  Not eating for a long time.  Not drinking enough liquids.  Recent illness.  Drinking alcohol.  Taking drugs that affect blood pressure, such as marijuana, cocaine, opiates, or inhalants. What are the signs or symptoms? Before a fainting episode, you may:  Feel dizzy or light-headed.  Become pale.  Sense that you are going to faint.  Feel like the room is spinning.  Only see directly ahead (tunnel vision).  Feel sick to your stomach (nauseous).  See spots.  Slowly lose vision.  Hear ringing in your ears.  Have a headache.  Feel warm and sweaty.  Feel a sensation of pins and needles. During the fainting spell, you may twitch or make jerky movements. Fainting spells usually last no longer than a few minutes before you wake up. If you get up too quickly before your body can recover, you may faint again. How is this diagnosed? This condition is diagnosed based on your symptoms, your medical history, and a physical exam. Tests may be done to rule out other causes of fainting. Tests may include:  Blood tests.  Heart tests, such as an electrocardiogram (ECG), echocardiogram, or electrophysiology study.  A test to check your response to changes in position (tilt table test). How is this treated? Usually, treatment is not needed for this condition. Your health care provider may suggest ways to help prevent fainting episodes. These may include:  Drinking  additional fluids if you are exposed to a trigger.  Sitting or lying down if you notice signs that an episode is coming. If your fainting spells continue, your health care provider may recommend that you:  Take medicines to prevent fainting or to help reduce further episodes of fainting.  Do certain exercises.  Wear compression stockings.  Have surgery to place a pacemaker in your body (rare). Follow these instructions at home:  Learn to identify the signs that an episode is coming.  Sit or lie down at the first  sign of a fainting spell. If you sit down, put your head down between your legs. If you lie down, swing your legs up in the air to increase blood flow to the brain.  Avoid hot tubs and saunas.  Avoid standing for a long time. If you have to stand for a long time, try:  Crossing your legs.  Flexing and stretching your leg muscles.  Squatting.  Moving your legs.  Bending over.  Drink enough fluid to keep your urine clear or pale yellow.  Make changes to your diet that your health care provider recommends. You may be told to:  Avoid caffeine.  Eat more salt.  Take over-the-counter and prescription medicines only as told by your health care provider. Contact a health care provider if:  You continue to have fainting spells despite treatment.  You faint more often despite treatment.  You lose consciousness for more than a few minutes.  You faint during or after exercising or after being startled.  You have twitching or jerky movements for longer than a few seconds during a fainting spell.  You have an episode of twitching or jerky movements without fainting. Get help right away if:  A fainting spell leads to an injury or bleeding.  You have new symptoms that occur with the fainting spells, such as:  Shortness of breath.  Chest pain.  Irregular heartbeat.  You twitch or make jerky movements for more than 5 minutes.  You twitch or make jerky  movements during more than one fainting spell. This information is not intended to replace advice given to you by your health care provider. Make sure you discuss any questions you have with your health care provider. Document Released: 01/27/2012 Document Revised: 07/24/2015 Document Reviewed: 12/08/2014 Elsevier Interactive Patient Education  2017 [place].     Signed, Candee Furbish, MD  07/08/2016 2:01 PM    [facility]

## 2016-07-08 NOTE — Telephone Encounter (Signed)
Order is in.

## 2016-07-08 NOTE — Progress Notes (Signed)
Please call and advise the patient that the recent scan we did was within normal limits. We did a brain MRI wo contrast, which showed normal findings. In particular, there were no acute findings, such as a stroke, or mass or blood products. No further action is required on this test at this time. Please remind patient to keep any upcoming appointments or tests and to call us with any interim questions, concerns, problems or updates. Thanks,  Star Age, MD, PhD

## 2016-07-08 NOTE — Patient Instructions (Signed)
Medication Instructions:  Your physician recommends that you continue on your current medications as directed. Please refer to the Current Medication list given to you today.   Labwork: None Ordered   Testing/Procedures: None Ordered   Follow-Up: Your physician recommends that you schedule a follow-up appointment in: as needed with Dr. Marlou Porch   If you need a refill on your cardiac medications before your next appointment, please call your pharmacy.   Thank you for choosing [facility]! Christen Bame, RN [phone]     Vasovagal Syncope, Adult Syncope, which is commonly known as fainting or passing out, is a temporary loss of consciousness. It occurs when the blood flow to the brain is reduced. Vasovagal syncope, also called neurocardiogenic syncope, is a fainting spell that happens when blood flow to the brain is reduced because of a sudden drop in heart rate and blood pressure. Vasovagal syncope is usually harmless. However, you can get injured if you fall during a fainting spell. What are the causes? This condition is caused by a drop in heart rate and blood pressure, usually in response to a trigger. Many things and situations can trigger an episode, including:  Pain.  Fear.  The sight of blood. This may occur during medical procedures, such as when blood is being drawn from a vein.  Common activities, such as coughing, swallowing, stretching, or going to the bathroom.  Emotional stress.  Being in a confined space.  Prolonged standing, especially in a warm environment.  Lack of sleep or rest.  Not eating for a long time.  Not drinking enough liquids.  Recent illness.  Drinking alcohol.  Taking drugs that affect blood pressure, such as marijuana, cocaine, opiates, or inhalants. What are the signs or symptoms? Before a fainting episode, you may:  Feel dizzy or light-headed.  Become pale.  Sense that you are going to faint.  Feel like the room  is spinning.  Only see directly ahead (tunnel vision).  Feel sick to your stomach (nauseous).  See spots.  Slowly lose vision.  Hear ringing in your ears.  Have a headache.  Feel warm and sweaty.  Feel a sensation of pins and needles. During the fainting spell, you may twitch or make jerky movements. Fainting spells usually last no longer than a few minutes before you wake up. If you get up too quickly before your body can recover, you may faint again. How is this diagnosed? This condition is diagnosed based on your symptoms, your medical history, and a physical exam. Tests may be done to rule out other causes of fainting. Tests may include:  Blood tests.  Heart tests, such as an electrocardiogram (ECG), echocardiogram, or electrophysiology study.  A test to check your response to changes in position (tilt table test). How is this treated? Usually, treatment is not needed for this condition. Your health care provider may suggest ways to help prevent fainting episodes. These may include:  Drinking additional fluids if you are exposed to a trigger.  Sitting or lying down if you notice signs that an episode is coming. If your fainting spells continue, your health care provider may recommend that you:  Take medicines to prevent fainting or to help reduce further episodes of fainting.  Do certain exercises.  Wear compression stockings.  Have surgery to place a pacemaker in your body (rare). Follow these instructions at home:  Learn to identify the signs that an episode is coming.  Sit or lie down at the first sign of a fainting  spell. If you sit down, put your head down between your legs. If you lie down, swing your legs up in the air to increase blood flow to the brain.  Avoid hot tubs and saunas.  Avoid standing for a long time. If you have to stand for a long time, try:  Crossing your legs.  Flexing and stretching your leg muscles.  Squatting.  Moving your  legs.  Bending over.  Drink enough fluid to keep your urine clear or pale yellow.  Make changes to your diet that your health care provider recommends. You may be told to:  Avoid caffeine.  Eat more salt.  Take over-the-counter and prescription medicines only as told by your health care provider. Contact a health care provider if:  You continue to have fainting spells despite treatment.  You faint more often despite treatment.  You lose consciousness for more than a few minutes.  You faint during or after exercising or after being startled.  You have twitching or jerky movements for longer than a few seconds during a fainting spell.  You have an episode of twitching or jerky movements without fainting. Get help right away if:  A fainting spell leads to an injury or bleeding.  You have new symptoms that occur with the fainting spells, such as:  Shortness of breath.  Chest pain.  Irregular heartbeat.  You twitch or make jerky movements for more than 5 minutes.  You twitch or make jerky movements during more than one fainting spell. This information is not intended to replace advice given to you by your health care provider. Make sure you discuss any questions you have with your health care provider. Document Released: 01/27/2012 Document Revised: 07/24/2015 Document Reviewed: 12/08/2014 Elsevier Interactive Patient Education  2017 [employer].

## 2016-07-09 ENCOUNTER — Telehealth: Payer: Self-pay

## 2016-07-09 NOTE — Telephone Encounter (Signed)
-----   Message from Star Age, MD sent at 07/08/2016  4:46 PM EDT ----- Please call and advise the patient that the recent scan we did was within normal limits. We did a brain MRI wo contrast, which showed normal findings. In particular, there were no acute findings, such as a stroke, or mass or blood products. No further action is required on this test at this time. Please remind patient to keep any upcoming appointments or tests and to call us with any interim questions, concerns, problems or updates. Thanks,  Star Age, MD, PhD

## 2016-07-09 NOTE — Telephone Encounter (Signed)
I spoke to patient and she is aware of results and recommendations. She is aware that sleep lab will call soon to schedule her sleep study.

## 2016-08-03 ENCOUNTER — Other Ambulatory Visit: Payer: Self-pay | Admitting: Neurology

## 2016-08-05 ENCOUNTER — Ambulatory Visit (INDEPENDENT_AMBULATORY_CARE_PROVIDER_SITE_OTHER): Payer: BLUE CROSS/BLUE SHIELD | Admitting: Neurology

## 2016-08-05 DIAGNOSIS — G4710 Hypersomnia, unspecified: Secondary | ICD-10-CM | POA: Diagnosis not present

## 2016-08-13 NOTE — Progress Notes (Signed)
Patient referred by Dr. Kerin Ransom, her optometrist, seen by me on 06/30/16, HST on 08/06/16.   Please call and notify the patient that the recent home sleep test did not show any significant obstructive sleep apnea. Patient can follow up her PCP, or if needed for migraine FU, we can see her back. Can see Jinny Blossom or Hoyle Sauer too, if needed for her migraine FU.    Once you have spoken to patient, you can close this encounter.   Thanks,  Star Age, MD, PhD [facility])

## 2016-08-13 NOTE — Procedures (Signed)
[facility] @[facility] [place], [place] [address] NAME: Reggie Welge DOB:02-23-1971 MEDICAL RECORD [id number] DOS: 08/06/16 REFERRING PHYSICIAN: Phineas Douglas, OD STUDY PERFORMED: HST HISTORY: 46 year old right-handed woman with an underlying medical history of irritable bowel syndrome, anxiety, allergies, migraines and obesity, who reports snoring, daytime sleepiness. BMI of 30.3.   STUDY RESULTS: Total Recording Time: 7h 13 min Total Apnea/Hypopnea Index (AHI): 1.0/hour Average Oxygen Saturation: 93% Lowest Oxygen Saturation: 83% with time below 88% of 0 min. Average Heart Rate: 64 IMPRESSION: Hypersomnia, unspec. RECOMMENDATION: This home sleep test does not demonstrate any significant obstructive or central sleep disordered breathing. Other causes of the patient's symptoms, including circadian rhythm disturbances, an underlying mood disorder, medication effect and/or an underlying medical problem cannot be ruled out based on this test. Clinical correlation is recommended. The patient and her referring provider will be notified of the test results; a follow up appointment will be made as necessary.  The patient should be cautioned not to drive, work at heights, or operate dangerous or heavy equipment when tired or sleepy. Review and reiteration of good sleep hygiene measures should be pursued with any patient.   I certify that I have reviewed the raw data recording prior to the issuance of this report in accordance with the standards of the American Academy of Sleep Medicine (AASM). Star Age, MD, PhD Diplomat, ABPN (Neurology and Sleep)

## 2016-08-17 ENCOUNTER — Telehealth: Payer: Self-pay

## 2016-08-17 NOTE — Telephone Encounter (Signed)
I called pt. I advised her that Dr. Rexene Alberts reviewed her HST and it did now show any significant obstructive sleep apnea. I advised her that Dr. Rexene Alberts recommends a follow up with her PCP, or with [facility] for her migraines. Pt wants to see a provider this week to discuss her migraines and perhaps a daily preventative such as topamax. Since Dr. Rexene Alberts is out of the office, an appt was made with pt to see Jinny Blossom, NP on 08/19/2016 at 11:00am. Pt verbalized understanding of results and appt date and time. Pt had no questions at this time but was encouraged to call back if questions arise.

## 2016-08-17 NOTE — Telephone Encounter (Signed)
-----   Message from Star Age, MD sent at 08/13/2016  6:08 PM EDT ----- Patient referred by Dr. Kerin Ransom, her optometrist, seen by me on 06/30/16, HST on 08/06/16.   Please call and notify the patient that the recent home sleep test did not show any significant obstructive sleep apnea. Patient can follow up her PCP, or if needed for migraine FU, we can see her back. Can see Jinny Blossom or Hoyle Sauer too, if needed for her migraine FU.    Once you have spoken to patient, you can close this encounter.   Thanks,  Star Age, MD, PhD [facility])

## 2016-08-19 ENCOUNTER — Ambulatory Visit (INDEPENDENT_AMBULATORY_CARE_PROVIDER_SITE_OTHER): Payer: BLUE CROSS/BLUE SHIELD | Admitting: Adult Health

## 2016-08-19 ENCOUNTER — Encounter: Payer: Self-pay | Admitting: Adult Health

## 2016-08-19 VITALS — BP 134/92 | HR 55 | Ht 66.0 in | Wt 193.0 lb

## 2016-08-19 DIAGNOSIS — G43009 Migraine without aura, not intractable, without status migrainosus: Secondary | ICD-10-CM

## 2016-08-19 MED ORDER — NORTRIPTYLINE HCL 10 MG PO CAPS: 10.0000 mg | ORAL_CAPSULE | Freq: Every day | ORAL | 3 refills | Status: DC

## 2016-08-19 NOTE — Progress Notes (Signed)
PATIENT: Nancy Freeman DOB: [birth year]  REASON FOR VISIT: follow up HISTORY FROM: patient  HISTORY OF PRESENT ILLNESS: Nancy Freeman is a 46 year old female with a history of migraine headaches. She returns today for follow-up. The patient did have a sleep study that did any significant obstructive sleep apnea. The patient will like to discuss a preventative medicine for her migraines. She reports that she has approximately 3-5 headaches a month. They always occur behind the left eye. She states prior to the headache she will have "fuzzy vision" in the periphery. She reports photophobia but denies phonophobia. She states that she does have nausea for which Zofran has been beneficial. She states that when she uses Imitrex her headache typically resolves in 10 minutes. She returns today for an evaluation.  HISTORY 06/30/16: Nancy Freeman is a 46 year old right-handed woman with an underlying medical history of irritable bowel syndrome, anxiety, allergies, and obesity, who reports a migraine episode on 06/23/2016 associated with decrease in vision and also intermittent tunnel vision in the recent few weeks or months. She has a prior history of migraine headaches without aura. I reviewed your eye examination note from 06/24/2016. Overall eye exam was unremarkable, she was started on Imitrex as needed, which has been helpful. She currently feels at baseline with respect her physician and also headache. She has a history of being sensitive to light. This is an ongoing issue, not currently worse. Her migraine history dates back to her early 15s. In 1996 she started Imitrex injections which were very helpful. She has a strong family history of migraines in her mother who passed away about 67 years ago, middle brother who has migraines and her only daughter also has migraines. She has an older brother who has a history of A. fib. Of note, in September 2017 she started weight loss medication, Contrave, and  while she has been able to lose about 22 pounds in the past 6-7 months, she has noted an increase in her migraine frequency. She has up to 2-3 migraine headaches per week at this time which is a significant increase in her migraines, she cannot pinpoint any other triggers or contributors. In the past, she has looked into food triggers and kept a diary for that. She has never been on headache prevention as her headache frequency was not high enough. She may have had a brain scan in the distant past. She did have a head CT in 2011 which was negative. Visual auras have been new to her headache history, she has had visual field restrictions while driving, not associated with a headache but when she had visual field loss recently she did end up having a headache afterwards. Her visual field impairment lasted for approximately a day and a half she estimates. She has woken up with headaches as well. She is fearful of waking up with headaches in the middle of the night or early morning currently and has even gone to sleep after taking Aleve to prevent morning headaches. Of note, she snores, she does not always wake up rested, she has daytime sleepiness, sometimes takes a nap on weekends. She has reduced her caffeine intake and does not drink daily soda or caffeine. She is a nonsmoker and does not typically drink any alcohol. She is married and lives with her husband, they have 1 daughter. She works for [employer]. She has woken herself up with a sense of gasping or choking. She does not have night to night nocturia. Bedtime is  around 10:30 and wake up time usually between 5:30 and 6:30 AM. Of note, her migraine is associated with photophobia, nausea and vomiting. She currently does not have any nausea medication available, in the past, she used Phenergan suppository. She feels that the Imitrex is effective but has taken a second pill recently as well.  REVIEW OF SYSTEMS: Out of a complete 14 system review of  symptoms, the patient complains only of the following symptoms, and all other reviewed systems are negative.  Headache, bruise/bleed easily, environmental allergies, muscle cramps, runny nose  ALLERGIES: Allergies  Allergen Reactions  . Sulfa Antibiotics Hives and Itching    HOME MEDICATIONS: Outpatient Medications Prior to Visit  Medication Sig Dispense Refill  . cetirizine (ZYRTEC) 10 MG tablet Take 10 mg by mouth daily.    Marland Kitchen estradiol (ESTRACE) 0.5 MG tablet Take 0.5 mg by mouth daily.    . Multiple Vitamin (MULTIVITAMIN) capsule Take 1 capsule by mouth daily.    . ondansetron (ZOFRAN-ODT) 4 MG disintegrating tablet DISSOLVE 1 TABLET(4 MG) ON THE TONGUE EVERY 8 HOURS AS NEEDED FOR NAUSEA OR VOMITING 20 tablet 0  . SUMAtriptan (IMITREX) 50 MG tablet Take 50 mg by mouth every 2 (two) hours as needed for migraine. May repeat in 2 hours if headache persists or recurs.    . Naltrexone-Bupropion HCl ER (CONTRAVE) 8-90 MG TB12 Take by mouth 2 (two) times daily.     No facility-administered medications prior to visit.     PAST MEDICAL HISTORY: Past Medical History:  Diagnosis Date  . Allergy   . Anxiety   . Headache   . Hidradenoma VULVAR  . IBS (irritable bowel syndrome)   . Internal hemorrhoid     PAST SURGICAL HISTORY: Past Surgical History:  Procedure Laterality Date  . ABDOMINAL HYSTERECTOMY  01-19-2001  DR LOMAX   W/ LEFT SALPINGO-OOPHORECTOMY AND EXCISION ENDOMETRIOSIS  . CESAREAN SECTION    . COLONOSCOPY    . HEMORRHOID BANDING  2018  . REMOVAL OF SIDE PLATE, DISTAL FIBULA, RIGHT ANKLE  05-08-2004  . UPPER GASTROINTESTINAL ENDOSCOPY      FAMILY HISTORY: Family History  Problem Relation Age of Onset  . Ovarian cancer Mother   . Uterine cancer Mother   . Breast cancer Mother 51  . Diabetes Father   . Kidney disease Paternal Grandmother   . Breast cancer Maternal Aunt 60  . Esophageal cancer Neg Hx   . Colon cancer Neg Hx   . Stomach cancer Neg Hx   .  Pancreatic cancer Neg Hx   . Liver disease Neg Hx   . Rectal cancer Neg Hx     SOCIAL HISTORY: Social History   Social History  . Marital status: Married    Spouse name: N/A  . Number of children: 1  . Years of education: BS   Occupational History  . [employer]     she recruits campers   Social History Main Topics  . Smoking status: Never Smoker  . Smokeless tobacco: Never Used  . Alcohol use No  . Drug use: No  . Sexual activity: Not on file   Other Topics Concern  . 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  Generalized: Well developed, in no acute distress   Neurological examination  Mentation: Alert oriented to time, place, history taking. Follows all commands speech and language fluent Cranial nerve II-XII: Pupils were equal round reactive to light. Extraocular movements were full, visual field were full on confrontational test. Facial sensation and strength were normal. Uvula tongue midline. Head turning and shoulder shrug  were normal and symmetric. Motor: The motor testing reveals 5 over 5 strength of all 4 extremities. Good symmetric motor tone is noted throughout.  Sensory: Sensory testing is intact to soft touch on all 4 extremities. No evidence of extinction is noted.  Coordination: Cerebellar testing reveals good finger-nose-finger and heel-to-shin bilaterally.  Gait and station: Gait is normal. Tandem gait is normal. Romberg is negative. No drift is seen.  Reflexes: Deep tendon reflexes are symmetric and normal bilaterally.   DIAGNOSTIC DATA (LABS, IMAGING, TESTING) - I reviewed patient records, labs, notes, testing and imaging myself where available.     ASSESSMENT AND PLAN 46 y.o. year old  female  has a past medical history of Allergy; Anxiety; Headache; Hidradenoma (VULVAR); IBS (irritable bowel syndrome); and Internal hemorrhoid. here with:  1. Migraine headaches  The patient would like to start on a preventative medication. She will be started on nortriptyline 10 mg at bedtime. I have reviewed the side effects of this medication with the patient and provided her with a handout. She denies any cardiac history. She will continue using Imitrex for acute treatment of her headaches. She is advised that if her headache frequency worsens or she develops new symptoms she should let us know. She will follow-up in 3 months or sooner if needed.     Ward Givens, MSN, NP-C 08/19/2016, 11:15 AM [facility] [address], [place], [place] [address] [phone]

## 2016-08-19 NOTE — Patient Instructions (Signed)
Your Plan:  Continue Imitrex for acute treatment of migraine Start Nortriptyline 10 mg at bedtime.   Thank you for coming to see Nancy Freeman at [facility]. I hope we have been able to provide you high quality care today.  You may receive a patient satisfaction survey over the next few weeks. We would appreciate your feedback and comments so that we may continue to improve ourselves and the health of our patients.  To prevent or relieve headaches, try the following: Cool Compress. Lie down and place a cool compress on your head.  Avoid headache triggers. If certain foods or odors seem to have triggered your migraines in the past, avoid them. A headache diary might help you identify triggers.  Include physical activity in your daily routine. Try a daily walk or other moderate aerobic exercise.  Manage stress. Find healthy ways to cope with the stressors, such as delegating tasks on your to-do list.  Practice relaxation techniques. Try deep breathing, yoga, massage and visualization.  Eat regularly. Eating regularly scheduled meals and maintaining a healthy diet might help prevent headaches. Also, drink plenty of fluids.  Follow a regular sleep schedule. Sleep deprivation might contribute to headaches Consider biofeedback. With this mind-body technique, you learn to control certain bodily functions - such as muscle tension, heart rate and blood pressure - to prevent headaches or reduce headache pain.    Proceed to emergency room if you experience new or worsening symptoms or symptoms do not resolve,    if you have new neurologic symptoms or if headache is severe, or for any concerning symptom.   Provided education and documentation from American headache Society toolbox including articles on: chronic migraine medication overuse headache, chronic migraines, prevention of migraines, behavioral and other nonpharmacologic treatments for headache.   Nortriptyline capsules What is this  medicine? NORTRIPTYLINE (nor TRIP ti leen) is used to treat depression. This medicine may be used for other purposes; ask your health care provider or pharmacist if you have questions. COMMON BRAND NAME(S): Aventyl, Pamelor What should I tell my health care provider before I take this medicine? They need to know if you have any of these conditions: -an alcohol problem -bipolar disorder or schizophrenia -difficulty passing urine, prostate trouble -glaucoma -heart disease or recent heart attack -liver disease -over active thyroid -seizures -thoughts or plans of suicide or a previous suicide attempt or family history of suicide attempt -an unusual or allergic reaction to nortriptyline, other medicines, foods, dyes, or preservatives -pregnant or trying to get pregnant -breast-feeding How should I use this medicine? Take this medicine by mouth with a glass of water. Follow the directions on the prescription label. Take your doses at regular intervals. Do not take it more often than directed. Do not stop taking this medicine suddenly except upon the advice of your doctor. Stopping this medicine too quickly may cause serious side effects or your condition may worsen. A special MedGuide will be given to you by the pharmacist with each prescription and refill. Be sure to read this information carefully each time. Talk to your pediatrician regarding the use of this medicine in children. Special care may be needed. Overdosage: If you think you have taken too much of this medicine contact a poison control center or emergency room at once. NOTE: This medicine is only for you. Do not share this medicine with others. What if I miss a dose? If you miss a dose, take it as soon as you can. If it is almost time  for your next dose, take only that dose. Do not take double or extra doses. What may interact with this medicine? Do not take this medicine with any of the following medications: -arsenic  trioxide -certain medicines medicines for irregular heart beat -cisapride -halofantrine -linezolid -MAOIs like Carbex, Eldepryl, Marplan, Nardil, and Parnate -methylene blue (injected into a vein) -other medicines for mental depression -phenothiazines like perphenazine, thioridazine and chlorpromazine -pimozide -probucol -procarbazine -sparfloxacin -St. John's Wort -ziprasidone This medicine may also interact with any of the following medications: -atropine and related drugs like hyoscyamine, scopolamine, tolterodine and others -barbiturate medicines for inducing sleep or treating seizures, such as phenobarbital -cimetidine -medicines for diabetes -medicines for seizures like carbamazepine or phenytoin -reserpine -thyroid medicine This list may not describe all possible interactions. Give your health care provider a list of all the medicines, herbs, non-prescription drugs, or dietary supplements you use. Also tell them if you smoke, drink alcohol, or use illegal drugs. Some items may interact with your medicine. What should I watch for while using this medicine? Tell your doctor if your symptoms do not get better or if they get worse. Visit your doctor or health care professional for regular checks on your progress. Because it may take several weeks to see the full effects of this medicine, it is important to continue your treatment as prescribed by your doctor. Patients and their families should watch out for new or worsening thoughts of suicide or depression. Also watch out for sudden changes in feelings such as feeling anxious, agitated, panicky, irritable, hostile, aggressive, impulsive, severely restless, overly excited and hyperactive, or not being able to sleep. If this happens, especially at the beginning of treatment or after a change in dose, call your health care professional. Dennis Bast may get drowsy or dizzy. Do not drive, use machinery, or do anything that needs mental alertness  until you know how this medicine affects you. Do not stand or sit up quickly, especially if you are an older patient. This reduces the risk of dizzy or fainting spells. Alcohol may interfere with the effect of this medicine. Avoid alcoholic drinks. Do not treat yourself for coughs, colds, or allergies without asking your doctor or health care professional for advice. Some ingredients can increase possible side effects. Your mouth may get dry. Chewing sugarless gum or sucking hard candy, and drinking plenty of water may help. Contact your doctor if the problem does not go away or is severe. This medicine may cause dry eyes and blurred vision. If you wear contact lenses you may feel some discomfort. Lubricating drops may help. See your eye doctor if the problem does not go away or is severe. This medicine can cause constipation. Try to have a bowel movement at least every 2 to 3 days. If you do not have a bowel movement for 3 days, call your doctor or health care professional. This medicine can make you more sensitive to the sun. Keep out of the sun. If you cannot avoid being in the sun, wear protective clothing and use sunscreen. Do not use sun lamps or tanning beds/booths. What side effects may I notice from receiving this medicine? Side effects that you should report to your doctor or health care professional as soon as possible: -allergic reactions like skin rash, itching or hives, swelling of the face, lips, or tongue -anxious -breathing problems -changes in vision -confusion -elevated mood, decreased need for sleep, racing thoughts, impulsive behavior -eye pain -fast, irregular heartbeat -feeling faint or lightheaded, falls -feeling agitated,  angry, or irritable -fever with increased sweating -hallucination, loss of contact with reality -seizures -stiff muscles -suicidal thoughts or other mood changes -tingling, pain, or numbness in the feet or hands -trouble passing urine or change in  the amount of urine -trouble sleeping -unusually weak or tired -vomiting -yellowing of the eyes or skin Side effects that usually do not require medical attention (report to your doctor or health care professional if they continue or are bothersome): -change in sex drive or performance -change in appetite or weight -constipation -dizziness -dry mouth -nausea -tired -tremors -upset stomach This list may not describe all possible side effects. Call your doctor for medical advice about side effects. You may report side effects to FDA at 1-800-FDA-1088. Where should I keep my medicine? Keep out of the reach of children. Store at room temperature between 15 and 30 degrees C (59 and 86 degrees F). Keep container tightly closed. Throw away any unused medicine after the expiration date. NOTE: This sheet is a summary. It may not cover all possible information. If you have questions about this medicine, talk to your doctor, pharmacist, or health care provider.  2018 Elsevier/Gold Standard (2015-07-12 12:53:08)

## 2016-08-19 NOTE — Progress Notes (Signed)
I have read the note, and I agree with the clinical assessment and plan.  Shalissa Easterwood KEITH   

## 2016-09-04 ENCOUNTER — Telehealth: Payer: Self-pay | Admitting: Adult Health

## 2016-09-04 MED ORDER — NORTRIPTYLINE HCL 10 MG PO CAPS: 20.0000 mg | ORAL_CAPSULE | Freq: Every day | ORAL | 5 refills | Status: DC

## 2016-09-04 NOTE — Telephone Encounter (Signed)
LVM informing patient a new Rx has been sent to her pharmacy. Left number for any questions.

## 2016-09-04 NOTE — Telephone Encounter (Signed)
Patient called office in reference to nortriptyline (PAMELOR) 10 MG capsule patient states she has tolerated the medication fine and would like to see about increasing it to 20mg .  Please call

## 2016-09-04 NOTE — Addendum Note (Signed)
Addended by: Trudie Buckler on: 09/04/2016 10:11 AM   Modules accepted: Orders

## 2016-09-04 NOTE — Telephone Encounter (Signed)
Order sent for increase to 20 mg at bedtime.

## 2016-09-04 NOTE — Telephone Encounter (Signed)
Please let her know new prescription has been sent

## 2016-09-16 ENCOUNTER — Other Ambulatory Visit: Payer: Self-pay | Admitting: Neurology

## 2016-10-22 ENCOUNTER — Other Ambulatory Visit: Payer: Self-pay | Admitting: Neurology

## 2016-11-19 ENCOUNTER — Ambulatory Visit: Payer: BLUE CROSS/BLUE SHIELD | Admitting: Adult Health

## 2016-11-20 ENCOUNTER — Encounter: Payer: Self-pay | Admitting: Adult Health

## 2017-01-23 ENCOUNTER — Other Ambulatory Visit: Payer: Self-pay | Admitting: Adult Health

## 2017-05-11 ENCOUNTER — Encounter: Payer: Self-pay | Admitting: Emergency Medicine

## 2017-05-11 ENCOUNTER — Other Ambulatory Visit: Payer: Self-pay

## 2017-05-11 ENCOUNTER — Emergency Department: Payer: BLUE CROSS/BLUE SHIELD

## 2017-05-11 ENCOUNTER — Emergency Department
Admission: EM | Admit: 2017-05-11 | Discharge: 2017-05-11 | Disposition: A | Discharge status: Home / Self Care | Payer: BLUE CROSS/BLUE SHIELD | Attending: Student in an Organized Health Care Education/Training Program | Admitting: Student in an Organized Health Care Education/Training Program

## 2017-05-11 DIAGNOSIS — Z79899 Other long term (current) drug therapy: Secondary | ICD-10-CM | POA: Insufficient documentation

## 2017-05-11 DIAGNOSIS — R1011 Right upper quadrant pain: Secondary | ICD-10-CM

## 2017-05-11 LAB — CBC
HCT: 41.9 % (ref 35.0–47.0)
Hemoglobin: 14.1 g/dL (ref 12.0–16.0)
MCH: 29.7 pg (ref 26.0–34.0)
MCHC: 33.6 g/dL (ref 32.0–36.0)
MCV: 88.4 fL (ref 80.0–100.0)
PLATELETS: 190 10*3/uL (ref 150–440)
RBC: 4.75 MIL/uL (ref 3.80–5.20)
RDW: 13.6 % (ref 11.5–14.5)
WBC: 5.1 10*3/uL (ref 3.6–11.0)

## 2017-05-11 LAB — URINALYSIS, COMPLETE (UACMP) WITH MICROSCOPIC
BILIRUBIN URINE: NEGATIVE
Glucose, UA: NEGATIVE mg/dL
Hgb urine dipstick: NEGATIVE
Ketones, ur: NEGATIVE mg/dL
Leukocytes, UA: NEGATIVE
Nitrite: NEGATIVE
PH: 5 (ref 5.0–8.0)
Protein, ur: NEGATIVE mg/dL
SPECIFIC GRAVITY, URINE: 1.025 (ref 1.005–1.030)

## 2017-05-11 LAB — COMPREHENSIVE METABOLIC PANEL
ALBUMIN: 3.9 g/dL (ref 3.5–5.0)
ALT: 20 U/L (ref 14–54)
AST: 23 U/L (ref 15–41)
Alkaline Phosphatase: 45 U/L (ref 38–126)
Anion gap: 8 (ref 5–15)
BILIRUBIN TOTAL: 0.8 mg/dL (ref 0.3–1.2)
BUN: 13 mg/dL (ref 6–20)
CO2: 26 mmol/L (ref 22–32)
CREATININE: 1.13 mg/dL — AB (ref 0.44–1.00)
Calcium: 8.9 mg/dL (ref 8.9–10.3)
Chloride: 107 mmol/L (ref 101–111)
GFR calc Af Amer: >60 mL/min (ref >60)
GFR, EST NON AFRICAN AMERICAN: 57 mL/min — AB (ref >60)
GLUCOSE: 105 mg/dL — AB (ref 65–99)
POTASSIUM: 3.8 mmol/L (ref 3.5–5.1)
Sodium: 141 mmol/L (ref 135–145)
TOTAL PROTEIN: 7.2 g/dL (ref 6.5–8.1)

## 2017-05-11 LAB — LIPASE, BLOOD: Lipase: 40 U/L (ref 11–51)

## 2017-05-11 MED ORDER — DICYCLOMINE HCL 10 MG PO CAPS
10.0000 mg | ORAL_CAPSULE | Freq: Once | ORAL | Status: AC
  Administered 2017-05-11: 10 mg via ORAL
  Filled 2017-05-11: qty 1

## 2017-05-11 MED ORDER — HYDROCODONE-ACETAMINOPHEN 5-325 MG PO TABS
1.0000 | ORAL_TABLET | Freq: Once | ORAL | Status: AC
  Administered 2017-05-11: 1 via ORAL
  Filled 2017-05-11: qty 1

## 2017-05-11 MED ORDER — MORPHINE SULFATE (PF) 4 MG/ML IV SOLN
4.0000 mg | INTRAVENOUS | Status: DC | PRN
  Administered 2017-05-11: 4 mg via INTRAVENOUS
  Filled 2017-05-11: qty 1

## 2017-05-11 MED ORDER — HYDROCODONE-ACETAMINOPHEN 5-325 MG PO TABS: 1.0000 | ORAL_TABLET | ORAL | 0 refills | Status: DC | PRN

## 2017-05-11 MED ORDER — DICYCLOMINE HCL 10 MG PO CAPS: 10.0000 mg | ORAL_CAPSULE | Freq: Three times a day (TID) | ORAL | 0 refills | Status: DC | PRN

## 2017-05-11 MED ORDER — PROMETHAZINE HCL 12.5 MG PO TABS: 12.5000 mg | ORAL_TABLET | Freq: Four times a day (QID) | ORAL | 0 refills | Status: DC | PRN

## 2017-05-11 MED ORDER — PROMETHAZINE HCL 25 MG/ML IJ SOLN
12.5000 mg | Freq: Four times a day (QID) | INTRAMUSCULAR | Status: DC | PRN
  Administered 2017-05-11: 12.5 mg via INTRAVENOUS
  Filled 2017-05-11: qty 1

## 2017-05-11 NOTE — ED Provider Notes (Signed)
[facility] Emergency Department Provider Note    None    (approximate)  I have reviewed the triage vital signs and the nursing notes.   HISTORY  Chief Complaint Abdominal Pain    HPI Nancy Freeman is a 47 y.o. female with a history of anxiety as well as IBS presents with her husband from home with chief complaint of right upper quadrant abdominal pain that waxes and wanes and is a pressure-like sensation since yesterday evening.  There is no trauma.  Is worse with laying on the right side as well as eating.  Pain gets worse after eating roughly 30 minutes after.  Has some nausea but has not had any vomiting.  No recent fevers but does endorse chills.  No shortness of breath.  Has had pain similar in the past but not this severe.  Had negative HIDA scan 2 years ago.  Past Medical History:  Diagnosis Date  . Allergy   . Anxiety   . Headache   . Hidradenoma VULVAR  . IBS (irritable bowel syndrome)   . Internal hemorrhoid    Family History  Problem Relation Age of Onset  . Ovarian cancer Mother   . Uterine cancer Mother   . Breast cancer Mother 40  . Diabetes Father   . Kidney disease Paternal Grandmother   . Breast cancer Maternal Aunt 60  . Esophageal cancer Neg Hx   . Colon cancer Neg Hx   . Stomach cancer Neg Hx   . Pancreatic cancer Neg Hx   . Liver disease Neg Hx   . Rectal cancer Neg Hx    Past Surgical History:  Procedure Laterality Date  . ABDOMINAL HYSTERECTOMY  01-19-2001  DR LOMAX   W/ LEFT SALPINGO-OOPHORECTOMY AND EXCISION ENDOMETRIOSIS  . CESAREAN SECTION    . COLONOSCOPY    . HEMORRHOID BANDING  2018  . REMOVAL OF SIDE PLATE, DISTAL FIBULA, RIGHT ANKLE  05-08-2004  . UPPER GASTROINTESTINAL ENDOSCOPY     Patient Active Problem List   Diagnosis Date Noted  . Prolapsed internal hemorrhoids, grade 2 05/19/2016      Prior to Admission medications   Medication Sig Start Date End Date Taking? Authorizing Provider    cetirizine (ZYRTEC) 10 MG tablet Take 10 mg by mouth daily.    [provider]  dicyclomine (BENTYL) 10 MG capsule Take 1 capsule (10 mg total) by mouth 3 (three) times daily as needed for up to 14 days for spasms. 05/11/17 05/25/17  Merlyn Lot, MD  estradiol (ESTRACE) 0.5 MG tablet Take 0.5 mg by mouth daily.    [provider]  HYDROcodone-acetaminophen (NORCO) 5-325 MG tablet Take 1 tablet by mouth every 4 (four) hours as needed for moderate pain. 05/11/17   Merlyn Lot, MD  Multiple Vitamin (MULTIVITAMIN) capsule Take 1 capsule by mouth daily.    [provider]  nortriptyline (PAMELOR) 10 MG capsule Take 2 capsules (20 mg total) by mouth at bedtime. 09/04/16   Ward Givens, NP  ondansetron (ZOFRAN-ODT) 4 MG disintegrating tablet DISSOLVE 1 TABLET(4 MG) ON THE TONGUE EVERY 8 HOURS AS NEEDED FOR NAUSEA OR VOMITING 10/22/16   Star Age, MD  promethazine (PHENERGAN) 12.5 MG tablet Take 1 tablet (12.5 mg total) by mouth every 6 (six) hours as needed for nausea or vomiting. 05/11/17   Merlyn Lot, MD  SUMAtriptan (IMITREX) 50 MG tablet Take 50 mg by mouth every 2 (two) hours as needed for migraine. May repeat in 2 hours  if headache persists or recurs.    [provider]    Allergies Sulfa antibiotics    Social History Social History   Tobacco Use  . Smoking status: Never Smoker  . Smokeless tobacco: Never Used  Substance Use Topics  . Alcohol use: No  . Drug use: No    Review of Systems Patient denies headaches, rhinorrhea, blurry vision, numbness, shortness of breath, chest pain, edema, cough, abdominal pain, nausea, vomiting, diarrhea, dysuria, fevers, rashes or hallucinations unless otherwise stated above in HPI. ____________________________________________   PHYSICAL EXAM:  VITAL SIGNS: Vitals:   05/11/17 0925 05/11/17 1419  BP: 131/84 (!) 141/87  Pulse: 65 63  Resp: 18 18  Temp: 98.7 F (37.1 C)   SpO2: 100% 100%     Constitutional: Alert and oriented. Well appearing and in no acute distress. Eyes: Conjunctivae are normal.  Head: Atraumatic. Nose: No congestion/rhinnorhea. Mouth/Throat: Mucous membranes are moist.   Neck: No stridor. Painless ROM.  Cardiovascular: Normal rate, regular rhythm. Grossly normal heart sounds.  Good peripheral circulation. Respiratory: Normal respiratory effort.  No retractions. Lungs CTAB. Gastrointestinal: Soft with mild epigastric and ruq ttp. No distention. No abdominal bruits. No CVA tenderness. Genitourinary:  Musculoskeletal: No lower extremity tenderness nor edema.  No joint effusions. Neurologic:  Normal speech and language. No gross focal neurologic deficits are appreciated. No facial droop Skin:  Skin is warm, dry and intact. No rash noted. Psychiatric: Mood and affect are normal. Speech and behavior are normal.  ____________________________________________   LABS (all labs ordered are listed, but only abnormal results are displayed)  Results for orders placed or performed during the hospital encounter of 05/11/17 (from the past 24 hour(s))  Lipase, blood     Status: None   Collection Time: 05/11/17  9:26 AM  Result Value Ref Range   Lipase 40 11 - 51 U/L  Comprehensive metabolic panel     Status: Abnormal   Collection Time: 05/11/17  9:26 AM  Result Value Ref Range   Sodium 141 135 - 145 mmol/L   Potassium 3.8 3.5 - 5.1 mmol/L   Chloride 107 101 - 111 mmol/L   CO2 26 22 - 32 mmol/L   Glucose, Bld 105 (H) 65 - 99 mg/dL   BUN 13 6 - 20 mg/dL   Creatinine, Ser 1.13 (H) 0.44 - 1.00 mg/dL   Calcium 8.9 8.9 - 10.3 mg/dL   Total Protein 7.2 6.5 - 8.1 g/dL   Albumin 3.9 3.5 - 5.0 g/dL   AST 23 15 - 41 U/L   ALT 20 14 - 54 U/L   Alkaline Phosphatase 45 38 - 126 U/L   Total Bilirubin 0.8 0.3 - 1.2 mg/dL   GFR calc non Af Amer 57 (L) >60 mL/min   GFR calc Af Amer >60 >60 mL/min   Anion gap 8 5 - 15  CBC     Status: None   Collection Time: 05/11/17   9:26 AM  Result Value Ref Range   WBC 5.1 3.6 - 11.0 K/uL   RBC 4.75 3.80 - 5.20 MIL/uL   Hemoglobin 14.1 12.0 - 16.0 g/dL   HCT 41.9 35.0 - 47.0 %   MCV 88.4 80.0 - 100.0 fL   MCH 29.7 26.0 - 34.0 pg   MCHC 33.6 32.0 - 36.0 g/dL   RDW 13.6 11.5 - 14.5 %   Platelets 190 150 - 440 K/uL  Urinalysis, Complete w Microscopic     Status: Abnormal   Collection Time: 05/11/17  9:26 AM  Result Value Ref Range   Color, Urine YELLOW (A) YELLOW   APPearance HAZY (A) CLEAR   Specific Gravity, Urine 1.025 1.005 - 1.030   pH 5.0 5.0 - 8.0   Glucose, UA NEGATIVE NEGATIVE mg/dL   Hgb urine dipstick NEGATIVE NEGATIVE   Bilirubin Urine NEGATIVE NEGATIVE   Ketones, ur NEGATIVE NEGATIVE mg/dL   Protein, ur NEGATIVE NEGATIVE mg/dL   Nitrite NEGATIVE NEGATIVE   Leukocytes, UA NEGATIVE NEGATIVE   RBC / HPF 0-5 0 - 5 RBC/hpf   WBC, UA 0-5 0 - 5 WBC/hpf   Bacteria, UA RARE (A) NONE SEEN   Squamous Epithelial / LPF 0-5 (A) NONE SEEN   Mucus PRESENT    ____________________________________________  EKG My review and personal interpretation at Time: 9:24   Indication: chest pain  Rate: 70  Rhythm: sinus Axis: normal Other: no stemi, nonspecific st abn ____________________________________________  RADIOLOGY  I personally reviewed all radiographic images ordered to evaluate for the above acute complaints and reviewed radiology reports and findings.  These findings were personally discussed with the patient.  Please see medical record for radiology report.  ____________________________________________   PROCEDURES  Procedure(s) performed:  Procedures    Critical Care performed: no ____________________________________________   INITIAL IMPRESSION / ASSESSMENT AND PLAN / ED COURSE  Pertinent labs & imaging results that were available during my care of the patient were reviewed by me and considered in my medical decision making (see chart for details).  DDX: Biliary colic, IBS,  cholecystitis, pneumonia, obstruction, muscular skeletal strain, shingles  Keeli Charmaine Placido is a 47 y.o. who presents to the ED with symptoms as described above.  Blood work and radiographs as well as ultrasound ordered for her presentation.  Ultrasound shows no acute abnormality.  Most likely biliary colic.  Possible IBS.  Not clinically consistent with ACS or PE.  Not clinically consistent with dissection.  Do not feel that CT imaging clinically indicated at this time.  At this point do believe patient stable and appropriate for outpatient follow-up.     As part of my medical decision making, I reviewed the following data within the [place] notes reviewed and incorporated, Labs reviewed, notes from prior ED visits.   ____________________________________________   FINAL CLINICAL IMPRESSION(S) / ED DIAGNOSES  Final diagnoses:  RUQ abdominal pain      NEW MEDICATIONS STARTED DURING THIS VISIT:  Discharge Medication List as of 05/11/2017  1:52 PM    START taking these medications   Details  dicyclomine (BENTYL) 10 MG capsule Take 1 capsule (10 mg total) by mouth 3 (three) times daily as needed for up to 14 days for spasms., Starting Tue 05/11/2017, Until Tue 05/25/2017, Print    HYDROcodone-acetaminophen (NORCO) 5-325 MG tablet Take 1 tablet by mouth every 4 (four) hours as needed for moderate pain., Starting Tue 05/11/2017, Print    promethazine (PHENERGAN) 12.5 MG tablet Take 1 tablet (12.5 mg total) by mouth every 6 (six) hours as needed for nausea or vomiting., Starting Tue 05/11/2017, Print         Note:  This document was prepared using Dragon voice recognition software and may include unintentional dictation errors.    Merlyn Lot, MD 05/11/17 2039

## 2017-05-11 NOTE — Discharge Instructions (Signed)

## 2017-05-11 NOTE — ED Triage Notes (Signed)
Pt to ED via POV c/o RUQ abd pain for the past week. Pt states that the pain is worse since last night. Pt states that the pain radiates into her back. Pt has had nausea but no vomiting. Pt in NAD at this time.

## 2017-05-12 ENCOUNTER — Ambulatory Visit: Payer: BLUE CROSS/BLUE SHIELD | Admitting: Gastroenterology

## 2017-05-12 ENCOUNTER — Encounter: Payer: Self-pay | Admitting: Gastroenterology

## 2017-05-12 VITALS — BP 120/87 | HR 77 | Ht 66.0 in | Wt 214.0 lb

## 2017-05-12 DIAGNOSIS — R1011 Right upper quadrant pain: Secondary | ICD-10-CM

## 2017-05-12 NOTE — Progress Notes (Signed)
Jonathon Bellows MD, MRCP(U.K) [address]  [place]  [place], [place] [address]  Main: [phone]  Fax: [phone]   Gastroenterology Consultation  Referring Provider:     Lavonne Chick, MD Primary Care Physician:  Lavonne Chick, MD Primary Gastroenterologist:  Dr. Jonathon Bellows  Reason for Consultation:     Abdominal pain         HPI:   Nancy Freeman is a 47 y.o. y/o female referred for consultation & management  by Dr. Tobin Chad, Terance Hart, MD.    She is here today as she was referred from the ER for a liver cyst . She has previously been seen by [facility] Dr Carlean Purl for IBS, banding of internal hemorrhoids 04/2016 . Last colonoscopy in 2017 . She was recently seen at the ER on 05/11/17 for RUQ pain of 1 day duration . Felt to be secondary to biliary colic or IBS . Benign cyst seen on RUQ USG done yesterday which is stable compared to CT scan from 2012. In addition normal gall bladder seen with no gall stones. CMP from yesterday showed normal transaminases and bilirubin. Hb 14.1 grams.    Today has none to minimal pain.    Abdominal pain: Onset: Started a day before ER visit , it was worse when she eats- ate soup for supper, woke her up in the night  Site :RUQ  Radiation: To the bacl  Severity :7-8/10  Petra Kuba of pain: felt like she was kicked  Aggravating factors: Laying on the right side, coughing , eating did not make it worse  Relieving factors :nothing - gradually got better on its own  Weight loss: none NSAID use: Alleve- took 2 a few nights back  PPI use :tums- for heartburn  Gall bladder surgery: still intact  Frequency of bowel movements: once ot twoce a day  Change in bowel movements: none  Relief with bowel movements: yes - in general for her IBS pain- RUQ pain did not get better with a bowel movement  Gas/Bloating/Abdominal distension: yes - was more distended when she had the pain .   Powerade zero did drink more of it -  2 big bottles prior to the  onset of the pain `, chewing gum - 8-10 sticks. Mentos .       Past Medical History:  Diagnosis Date  . Allergy   . Anxiety   . Headache   . Hidradenoma VULVAR  . IBS (irritable bowel syndrome)   . Internal hemorrhoid     Past Surgical History:  Procedure Laterality Date  . ABDOMINAL HYSTERECTOMY  01-19-2001  DR LOMAX   W/ LEFT SALPINGO-OOPHORECTOMY AND EXCISION ENDOMETRIOSIS  . CESAREAN SECTION    . COLONOSCOPY    . HEMORRHOID BANDING  2018  . REMOVAL OF SIDE PLATE, DISTAL FIBULA, RIGHT ANKLE  05-08-2004  . UPPER GASTROINTESTINAL ENDOSCOPY      Prior to Admission medications   Medication Sig Start Date End Date Taking? Authorizing Provider  dicyclomine (BENTYL) 10 MG capsule Take 1 capsule (10 mg total) by mouth 3 (three) times daily as needed for up to 14 days for spasms. 05/11/17 05/25/17 Yes Merlyn Lot, MD  fluticasone [facility]) 50 MCG/ACT nasal spray Place into both nostrils daily.   Yes [provider]  Multiple Vitamin (MULTIVITAMIN) capsule Take 1 capsule by mouth daily.   Yes [provider]  promethazine (PHENERGAN) 12.5 MG tablet Take 1 tablet (12.5 mg total) by mouth every 6 (six) hours as  needed for nausea or vomiting. 05/11/17  Yes Merlyn Lot, MD  SUMAtriptan (IMITREX) 50 MG tablet Take 50 mg by mouth every 2 (two) hours as needed for migraine. May repeat in 2 hours if headache persists or recurs.   Yes [provider]  cetirizine (ZYRTEC) 10 MG tablet Take 10 mg by mouth daily.    [provider]  HYDROcodone-acetaminophen (NORCO) 5-325 MG tablet Take 1 tablet by mouth every 4 (four) hours as needed for moderate pain. Patient not taking: Reported on 05/12/2017 05/11/17   Merlyn Lot, MD    Family History  Problem Relation Age of Onset  . Ovarian cancer Mother   . Uterine cancer Mother   . Breast cancer Mother 35  . Diabetes Father   . Kidney disease Paternal Grandmother   . Breast cancer Maternal Aunt 60    . Esophageal cancer Neg Hx   . Colon cancer Neg Hx   . Stomach cancer Neg Hx   . Pancreatic cancer Neg Hx   . Liver disease Neg Hx   . Rectal cancer Neg Hx      Social History   Tobacco Use  . Smoking status: Never Smoker  . Smokeless tobacco: Never Used  Substance Use Topics  . Alcohol use: No  . Drug use: No    Allergies as of 05/12/2017 - Review Complete 05/12/2017  Allergen Reaction Noted  . Sulfa antibiotics Hives and Itching 12/23/2015    Review of Systems:    All systems reviewed and negative except where noted in HPI.   Physical Exam:  BP 120/87 (BP Location: Left Arm, Patient Position: Sitting, Cuff Size: Large)   Pulse 77   Ht 5\' 6"  (1.676 m)   Wt 214 lb (97.1 kg)   BMI 34.54 kg/m  No LMP recorded. Patient has had a hysterectomy. Psych:  Alert and cooperative. Normal mood and affect. General:   Alert,  Well-developed, well-nourished, pleasant and cooperative in NAD Head:  Normocephalic and atraumatic. Eyes:  Sclera clear, no icterus.   Conjunctiva pink. Ears:  Normal auditory acuity. Nose:  No deformity, discharge, or lesions. Mouth:  No deformity or lesions,oropharynx pink & moist. Neck:  Supple; no masses or thyromegaly. Lungs:  Respirations even and unlabored.  Clear throughout to auscultation.   No wheezes, crackles, or rhonchi. No acute distress. Heart:  Regular rate and rhythm; no murmurs, clicks, rubs, or gallops. Abdomen:  Normal bowel sounds.  No bruits.  Soft, non-tender and non-distended without masses, hepatosplenomegaly or hernias noted.  No guarding or rebound tenderness.    Msk:  Symmetrical without gross deformities. Good, equal movement & strength bilaterally. Pulses:  Normal pulses noted. Extremities:  No clubbing or edema.  No cyanosis. Neurologic:  Alert and oriented x3;  grossly normal neurologically. Skin:  Intact without significant lesions or rashes. No jaundice. Lymph Nodes:  No significant cervical adenopathy. Psych:  Alert and  cooperative. Normal mood and affect.  Imaging Studies: US Abdomen Limited Ruq  Result Date: 05/11/2017 CLINICAL DATA:  Right upper quadrant pain. EXAM: ULTRASOUND ABDOMEN LIMITED RIGHT UPPER QUADRANT COMPARISON:  CT 05/26/2010. FINDINGS: Gallbladder: No gallstones or wall thickening visualized. No sonographic Murphy sign noted by sonographer. Common bile duct: Diameter: 1 mm Liver: Stable thinly septated cyst noted the right lobe liver consistent benign cyst. Within normal limits in parenchymal echogenicity. Portal vein is patent on color Doppler imaging with normal direction of blood flow towards the liver. IMPRESSION: 1.  No acute abnormalities.  No gallstones or biliary  distention. 2. Stable thinly septated cyst right lobe of the liver consistent with benign cyst. No interim change from CT 05/26/2010. Electronically Signed   By: Marcello Moores  Register   On: 05/11/2017 13:34    Assessment and Plan:   Crystie Kelissa Merlin is a 47 y.o. y/o female has been referred for RUQ pain which seems secondary to excess gas which in turn related to large qty of artificial sweeteners in her diet . Liver cyst is benign and needs no further evaluation since stable.    Plan  1. Stop all artificial sugars, chewing gums, powerade 2. Trial of LOW FODMAP diet - if no better at next visit will consider testing for celiac disease..  Follow up PRN  Dr Jonathon Bellows MD,MRCP(U.K)

## 2017-05-19 ENCOUNTER — Other Ambulatory Visit: Payer: Self-pay | Admitting: Family Medicine

## 2017-05-19 DIAGNOSIS — Z1231 Encounter for screening mammogram for malignant neoplasm of breast: Secondary | ICD-10-CM

## 2017-06-15 ENCOUNTER — Ambulatory Visit
Admission: RE | Admit: 2017-06-15 | Discharge: 2017-06-15 | Disposition: A | Discharge status: Home / Self Care | Payer: BLUE CROSS/BLUE SHIELD | Source: Ambulatory Visit | Attending: Family Medicine | Admitting: Family Medicine

## 2017-06-15 DIAGNOSIS — Z1231 Encounter for screening mammogram for malignant neoplasm of breast: Secondary | ICD-10-CM | POA: Diagnosis present

## 2017-06-22 ENCOUNTER — Other Ambulatory Visit: Payer: Self-pay | Admitting: Family Medicine

## 2017-06-22 DIAGNOSIS — R928 Other abnormal and inconclusive findings on diagnostic imaging of breast: Secondary | ICD-10-CM

## 2017-06-22 DIAGNOSIS — N6489 Other specified disorders of breast: Secondary | ICD-10-CM

## 2017-07-06 ENCOUNTER — Ambulatory Visit
Admission: RE | Admit: 2017-07-06 | Discharge: 2017-07-06 | Disposition: A | Discharge status: Home / Self Care | Payer: BLUE CROSS/BLUE SHIELD | Source: Ambulatory Visit | Attending: Family Medicine | Admitting: Family Medicine

## 2017-07-06 DIAGNOSIS — R928 Other abnormal and inconclusive findings on diagnostic imaging of breast: Secondary | ICD-10-CM

## 2017-07-06 DIAGNOSIS — N6489 Other specified disorders of breast: Secondary | ICD-10-CM | POA: Diagnosis present

## 2017-07-06 DIAGNOSIS — Z803 Family history of malignant neoplasm of breast: Secondary | ICD-10-CM | POA: Insufficient documentation

## 2017-07-07 ENCOUNTER — Other Ambulatory Visit: Payer: Self-pay | Admitting: Family Medicine

## 2017-07-07 DIAGNOSIS — R928 Other abnormal and inconclusive findings on diagnostic imaging of breast: Secondary | ICD-10-CM

## 2017-07-07 DIAGNOSIS — N6489 Other specified disorders of breast: Secondary | ICD-10-CM

## 2017-07-29 ENCOUNTER — Emergency Department
Admission: EM | Admit: 2017-07-29 | Discharge: 2017-07-29 | Disposition: A | Discharge status: Home / Self Care | Payer: BLUE CROSS/BLUE SHIELD | Attending: Emergency Medicine | Admitting: Emergency Medicine

## 2017-07-29 ENCOUNTER — Encounter: Payer: Self-pay | Admitting: Emergency Medicine

## 2017-07-29 ENCOUNTER — Emergency Department: Payer: BLUE CROSS/BLUE SHIELD

## 2017-07-29 DIAGNOSIS — R42 Dizziness and giddiness: Secondary | ICD-10-CM | POA: Diagnosis present

## 2017-07-29 DIAGNOSIS — Z79899 Other long term (current) drug therapy: Secondary | ICD-10-CM | POA: Diagnosis not present

## 2017-07-29 DIAGNOSIS — R0789 Other chest pain: Secondary | ICD-10-CM | POA: Insufficient documentation

## 2017-07-29 LAB — CBC
HEMATOCRIT: 38.1 % (ref 35.0–47.0)
Hemoglobin: 13.5 g/dL (ref 12.0–16.0)
MCH: 30.1 pg (ref 26.0–34.0)
MCHC: 35.4 g/dL (ref 32.0–36.0)
MCV: 85.1 fL (ref 80.0–100.0)
Platelets: 189 10*3/uL (ref 150–440)
RBC: 4.48 MIL/uL (ref 3.80–5.20)
RDW: 12.9 % (ref 11.5–14.5)
WBC: 5.6 10*3/uL (ref 3.6–11.0)

## 2017-07-29 LAB — BASIC METABOLIC PANEL
ANION GAP: 12 (ref 5–15)
BUN: 9 mg/dL (ref 6–20)
CALCIUM: 8.4 mg/dL — AB (ref 8.9–10.3)
CHLORIDE: 108 mmol/L (ref 101–111)
CO2: 22 mmol/L (ref 22–32)
Creatinine, Ser: 0.9 mg/dL (ref 0.44–1.00)
GFR calc non Af Amer: >60 mL/min (ref >60)
GLUCOSE: 94 mg/dL (ref 65–99)
Potassium: 3 mmol/L — ABNORMAL LOW (ref 3.5–5.1)
Sodium: 142 mmol/L (ref 135–145)

## 2017-07-29 LAB — TROPONIN I: Troponin I: <0.03 ng/mL (ref <0.03)

## 2017-07-29 MED ORDER — POTASSIUM CHLORIDE CRYS ER 20 MEQ PO TBCR
40.0000 meq | EXTENDED_RELEASE_TABLET | Freq: Once | ORAL | Status: AC
  Administered 2017-07-29: 40 meq via ORAL
  Filled 2017-07-29: qty 2

## 2017-07-29 NOTE — ED Triage Notes (Signed)
Pt arrived via ems with complaints of left sided chest pain. Pt was given 324mg  of asa and 1 sublingual NTG which has alleviated her chest pain. PT in NAD

## 2017-07-29 NOTE — ED Provider Notes (Addendum)
[facility] Emergency Department Provider Note  ____________________________________________   I have reviewed the triage vital signs and the nursing notes. Where available I have reviewed prior notes and, if possible and indicated, outside hospital notes.    HISTORY  Chief Complaint Chest Pain    HPI Nancy Freeman is a 47 y.o. female who presents today complaining of a feeling of lightheadedness and chest discomfort.  Patient has had this off and on several times a month for years she had a negative stress test for this in 2015 she was told that this is orthostatic hypotension as well as anxiety.  Patient states that this morning she had a normal bowel feeling lightheaded and having chest discomfort.  She states that it lasted for about an hour or so.  She denies any focal numbness or weakness, no pleuritic pain.  The chest pain is a feeling like "I am wearing a bra that is too tight".  She has no personal family history of PE or DVT no recent travel not on any exogenous estrogens no leg swelling, no recent surgeries.  Patient states that this is the exact same sensation she has had multiple times but she called 911 today because it seemed to last longer than normal.  Pain is completely gone at this time as it always is she states after a brief period.  Nothing made it better at that she can think of nothing made it worse no other alleviating or aggravating symptoms, she did have nitroglycerin prior to coming in from EMS.  EMS EKG and vital signs are reassuring Does state that these events bring up panic attacks and she states she was feeling somewhat anxious  Past Medical History:  Diagnosis Date  . Allergy   . Anxiety   . Headache   . Hidradenoma VULVAR  . IBS (irritable bowel syndrome)   . Internal hemorrhoid     Patient Active Problem List   Diagnosis Date Noted  . Prolapsed internal hemorrhoids, grade 2 05/19/2016    Past Surgical History:   Procedure Laterality Date  . ABDOMINAL HYSTERECTOMY  01-19-2001  DR LOMAX   W/ LEFT SALPINGO-OOPHORECTOMY AND EXCISION ENDOMETRIOSIS  . CESAREAN SECTION    . COLONOSCOPY    . HEMORRHOID BANDING  2018  . REMOVAL OF SIDE PLATE, DISTAL FIBULA, RIGHT ANKLE  05-08-2004  . UPPER GASTROINTESTINAL ENDOSCOPY      Prior to Admission medications   Medication Sig Start Date End Date Taking? Authorizing Provider  cetirizine (ZYRTEC) 10 MG tablet Take 10 mg by mouth daily.    [provider]  dicyclomine (BENTYL) 10 MG capsule Take 1 capsule (10 mg total) by mouth 3 (three) times daily as needed for up to 14 days for spasms. 05/11/17 05/25/17  Merlyn Lot, MD  fluticasone (FLONASE) 50 MCG/ACT nasal spray Place into both nostrils daily.    [provider]  HYDROcodone-acetaminophen (NORCO) 5-325 MG tablet Take 1 tablet by mouth every 4 (four) hours as needed for moderate pain. Patient not taking: Reported on 05/12/2017 05/11/17   Merlyn Lot, MD  Multiple Vitamin (MULTIVITAMIN) capsule Take 1 capsule by mouth daily.    [provider]  promethazine (PHENERGAN) 12.5 MG tablet Take 1 tablet (12.5 mg total) by mouth every 6 (six) hours as needed for nausea or vomiting. 05/11/17   Merlyn Lot, MD  SUMAtriptan (IMITREX) 50 MG tablet Take 50 mg by mouth every 2 (two) hours as needed for migraine. May repeat in 2 hours  if headache persists or recurs.    [provider]    Allergies Sulfa antibiotics  Family History  Problem Relation Age of Onset  . Ovarian cancer Mother   . Uterine cancer Mother   . Breast cancer Mother 80  . Diabetes Father   . Kidney disease Paternal Grandmother   . Breast cancer Maternal Aunt 60  . Esophageal cancer Neg Hx   . Colon cancer Neg Hx   . Stomach cancer Neg Hx   . Pancreatic cancer Neg Hx   . Liver disease Neg Hx   . Rectal cancer Neg Hx     Social History Social History   Tobacco Use  . Smoking status: Never  Smoker  . Smokeless tobacco: Never Used  Substance Use Topics  . Alcohol use: No  . Drug use: No    Review of Systems Constitutional: No fever/chills Eyes: No visual changes. ENT: No sore throat. No stiff neck no neck pain Cardiovascular: See HPI Respiratory: Denies shortness of breath. Gastrointestinal:   no vomiting.  No diarrhea.  No constipation. Genitourinary: Negative for dysuria. Musculoskeletal: Negative lower extremity swelling Skin: Negative for rash. Neurological: Negative for severe headaches, focal weakness or numbness.   ____________________________________________   PHYSICAL EXAM:  VITAL SIGNS: ED Triage Vitals  Enc Vitals Group     BP 07/29/17 1507 (!) 158/100     Pulse Rate 07/29/17 1507 79     Resp 07/29/17 1507 15     Temp 07/29/17 1507 97.8 F (36.6 C)     Temp Source 07/29/17 1507 Oral     SpO2 07/29/17 1507 97 %     Weight 07/29/17 1508 216 lb (98 kg)     Height 07/29/17 1508 5\' 6"  (1.676 m)     Head Circumference --      Peak Flow --      Pain Score 07/29/17 1508 0     Pain Loc --      Pain Edu? --      Excl. in [place]? --     Constitutional: Alert and oriented. Well appearing and in no acute distress. Eyes: Conjunctivae are normal Head: Atraumatic HEENT: No congestion/rhinnorhea. Mucous membranes are moist.  Oropharynx non-erythematous Neck:   Nontender with no meningismus, no masses, no stridor Cardiovascular: Normal rate, regular rhythm. Grossly normal heart sounds.  Good peripheral circulation. Chest: Some minimal tenderness to palpation the pectoralis muscle when I touch area she states "that is the pain right there" also elicitable with ranging of the arm, no crepitus no flail chest no lesions noted not red not hot to touch. Respiratory: Normal respiratory effort.  No retractions. Lungs CTAB. Abdominal: Soft and nontender. No distention. No guarding no rebound Back:  There is no focal tenderness or step off.  there is no midline  tenderness there are no lesions noted. there is no CVA tenderness Musculoskeletal: No lower extremity tenderness, no upper extremity tenderness. No joint effusions, no DVT signs strong distal pulses no edema Neurologic:  Normal speech and language. No gross focal neurologic deficits are appreciated.  Skin:  Skin is warm, dry and intact. No rash noted. Psychiatric: Mood and affect are anxious. Speech and behavior are normal.  ____________________________________________   LABS (all labs ordered are listed, but only abnormal results are displayed)  Labs Reviewed  BASIC METABOLIC PANEL - Abnormal; Notable for the following components:      Result Value   Potassium 3.0 (*)    Calcium 8.4 (*)  All other components within normal limits  CBC  TROPONIN I    Pertinent labs  results that were available during my care of the patient were reviewed by me and considered in my medical decision making (see chart for details). ____________________________________________  EKG  I personally interpreted any EKGs ordered by me or triage Normal sinus rhythm rate 75 bpm no acute ST elevation or depression borderline LAD, no acute ischemic changes ____________________________________________  RADIOLOGY  Pertinent labs & imaging results that were available during my care of the patient were reviewed by me and considered in my medical decision making (see chart for details). If possible, patient and/or family made aware of any abnormal findings.  Dg Chest 2 View  Result Date: 07/29/2017 CLINICAL DATA:  Hypertensive, dizziness. EXAM: CHEST - 2 VIEW COMPARISON:  Chest radiograph Jun 27, 2014 FINDINGS: Cardiomediastinal silhouette is normal. No pleural effusions or focal consolidations. Trachea projects midline and there is no pneumothorax. Soft tissue planes and included osseous structures are non-suspicious. Early degenerative changes of the thoracic spine. IMPRESSION: Negative. Electronically Signed    By: Elon Alas M.D.   On: 07/29/2017 15:48   ____________________________________________    PROCEDURES  Procedure(s) performed: None  Procedures  Critical Care performed: None  ____________________________________________   INITIAL IMPRESSION / ASSESSMENT AND PLAN / ED COURSE  Pertinent labs & imaging results that were available during my care of the patient were reviewed by me and considered in my medical decision making (see chart for details).  Patient with very atypical chest discomfort which is chronic and recurrent, she has been seen and evaluated by cardiology and told that this is not a Cardiologic pathology.  Low suspicion for PE given the fact that she has a several times a week for several years.  EKG is reassuring, initial troponin is negative we will do 2 sets of enzymes and if those are reassuring is my hope that we can get her safely home with outpatient follow-up for this chronic issue  ----------------------------------------- 5:46 PM on 07/29/2017 -----------------------------------------  Still in no acute distress breathing comfortably for repeat troponin, agrees with management discharge if troponin negative.  Work-up is unremarkable here.  At this time, there does not appear to be clinical evidence to support the diagnosis of pulmonary embolus, dissection, myocarditis, endocarditis, pericarditis, pericardial tamponade, acute coronary syndrome, pneumothorax, pneumonia, or any other acute intrathoracic pathology that will require admission or acute intervention. Nor is there evidence of any significant intra-abdominal pathology causing this discomfort.  ----------------------------------------- 6:55 PM on 07/29/2017 -----------------------------------------  Mains pain-free unless I touch her chest wall, discussed with Dr. Laurelyn Sickle of cardiology he agrees with management, we discussed the patient's finding, and risk factors etc.  He can see the patient  tomorrow morning at 10 AM he does not feel admission is warranted and patient is very strongly not in favor being admitted as well.  This was discussed and offered.  Given chronic recurrent nature of her pain, negative work-up with 2 troponins EKG reassuring and no real risk factors for PE with no evidence of PE on exam or history, we will discharge the patient with close outpatient follow-up and return precautions given and understood.   ____________________________________________   FINAL CLINICAL IMPRESSION(S) / ED DIAGNOSES  Final diagnoses:  None      This chart was dictated using voice recognition software.  Despite best efforts to proofread,  errors can occur which can change meaning.      Schuyler Amor, MD 07/29/17 [phone]  Schuyler Amor, MD 07/29/17 1747    Schuyler Amor, MD 07/29/17 [phone]

## 2017-07-29 NOTE — Discharge Instructions (Addendum)
Return to the emergency room for any new or worrisome symptoms including chest pain, severe headache, numbness, weakness shortness of breath, change in her chronic chest pain or any other concerns.  Follow closely with cardiology tomorrow morning at 10 AM without fail.

## 2017-08-09 ENCOUNTER — Emergency Department (HOSPITAL_COMMUNITY)
Admission: EM | Admit: 2017-08-09 | Discharge: 2017-08-10 | Disposition: A | Discharge status: Home / Self Care | Payer: BLUE CROSS/BLUE SHIELD | Attending: Emergency Medicine | Admitting: Emergency Medicine

## 2017-08-09 ENCOUNTER — Encounter (HOSPITAL_COMMUNITY): Payer: Self-pay | Admitting: Emergency Medicine

## 2017-08-09 ENCOUNTER — Other Ambulatory Visit: Payer: Self-pay

## 2017-08-09 ENCOUNTER — Emergency Department (HOSPITAL_COMMUNITY): Payer: BLUE CROSS/BLUE SHIELD

## 2017-08-09 DIAGNOSIS — R2241 Localized swelling, mass and lump, right lower limb: Secondary | ICD-10-CM | POA: Insufficient documentation

## 2017-08-09 DIAGNOSIS — M79604 Pain in right leg: Secondary | ICD-10-CM | POA: Diagnosis not present

## 2017-08-09 DIAGNOSIS — Z79899 Other long term (current) drug therapy: Secondary | ICD-10-CM | POA: Insufficient documentation

## 2017-08-09 LAB — BASIC METABOLIC PANEL
Anion gap: 9 (ref 5–15)
BUN: 10 mg/dL (ref 6–20)
CALCIUM: 9 mg/dL (ref 8.9–10.3)
CO2: 26 mmol/L (ref 22–32)
CREATININE: 1.06 mg/dL — AB (ref 0.44–1.00)
Chloride: 107 mmol/L (ref 101–111)
GFR calc non Af Amer: >60 mL/min (ref >60)
GLUCOSE: 104 mg/dL — AB (ref 65–99)
Potassium: 3.3 mmol/L — ABNORMAL LOW (ref 3.5–5.1)
Sodium: 142 mmol/L (ref 135–145)

## 2017-08-09 LAB — CBC
HCT: 41.3 % (ref 36.0–46.0)
Hemoglobin: 13.5 g/dL (ref 12.0–15.0)
MCH: 28.9 pg (ref 26.0–34.0)
MCHC: 32.7 g/dL (ref 30.0–36.0)
MCV: 88.4 fL (ref 78.0–100.0)
PLATELETS: 194 10*3/uL (ref 150–400)
RBC: 4.67 MIL/uL (ref 3.87–5.11)
RDW: 12 % (ref 11.5–15.5)
WBC: 6.4 10*3/uL (ref 4.0–10.5)

## 2017-08-09 LAB — I-STAT BETA HCG BLOOD, ED (MC, WL, AP ONLY): I-stat hCG, quantitative: <5 m[IU]/mL (ref <5)

## 2017-08-09 LAB — I-STAT TROPONIN, ED: TROPONIN I, POC: 0 ng/mL (ref 0.00–0.08)

## 2017-08-09 NOTE — ED Triage Notes (Addendum)
Patient with shortness of breath and dizziness.  She states that the dizziness has been going on for the last few days, off and on.  She states that she had an echo on Friday and stress test.  Patient also scheduled for CT angio this week.  She was seen last a few weeks ago at [facility] ED for chest pain.  She is having chest pain off and on.  She states that she is having some swelling and pain when she walks on right leg.  Swelling noted to right foot and lower leg.  No pain with palpation.

## 2017-08-10 LAB — D-DIMER, QUANTITATIVE: D-Dimer, Quant: 0.38 ug/mL-FEU (ref 0.00–0.50)

## 2017-08-10 NOTE — ED Notes (Signed)
Discharge instructions discussed with Pt. Pt verbalized understanding. Pt stable and ambulatory.    

## 2017-08-10 NOTE — ED Provider Notes (Signed)
[place] EMERGENCY DEPARTMENT Provider Note   CSN: [id number] Arrival date & time: 08/09/17  2230     History   Chief Complaint Chief Complaint  Patient presents with  . Shortness of Breath  . Dizziness    HPI Nancy Freeman is a 47 y.o. female.  Presents to the ER primarily for evaluation of swelling of her right leg.  Patient reports that she was at church earlier tonight when she noticed that her right pant leg felt tight on her leg.  She denies any injury.  She is not expensing any chest pain or shortness of breath.  Patient has, however, recently had ongoing issues with chest pain and dizziness.  She is currently undergoing outpatient work-up for this.     Past Medical History:  Diagnosis Date  . Allergy   . Anxiety   . Headache   . Hidradenoma VULVAR  . IBS (irritable bowel syndrome)   . Internal hemorrhoid     Patient Active Problem List   Diagnosis Date Noted  . Prolapsed internal hemorrhoids, grade 2 05/19/2016    Past Surgical History:  Procedure Laterality Date  . ABDOMINAL HYSTERECTOMY  01-19-2001  DR LOMAX   W/ LEFT SALPINGO-OOPHORECTOMY AND EXCISION ENDOMETRIOSIS  . CESAREAN SECTION    . COLONOSCOPY    . HEMORRHOID BANDING  2018  . REMOVAL OF SIDE PLATE, DISTAL FIBULA, RIGHT ANKLE  05-08-2004  . UPPER GASTROINTESTINAL ENDOSCOPY       OB History   None      Home Medications    Prior to Admission medications   Medication Sig Start Date End Date Taking? Authorizing Provider  fluticasone (FLONASE) 50 MCG/ACT nasal spray Place into both nostrils daily.   Yes [provider]  Multiple Vitamin (MULTIVITAMIN) capsule Take 1 capsule by mouth daily.   Yes [provider]  pantoprazole (PROTONIX) 40 MG tablet Take 40 mg by mouth daily. 08/06/17  Yes [provider]  SUMAtriptan (IMITREX) 50 MG tablet Take 50 mg by mouth every 2 (two) hours as needed for migraine. May repeat in 2 hours if headache  persists or recurs.   Yes [provider]  dicyclomine (BENTYL) 10 MG capsule Take 1 capsule (10 mg total) by mouth 3 (three) times daily as needed for up to 14 days for spasms. Patient not taking: Reported on 08/10/2017 05/11/17 08/10/25  Merlyn Lot, MD  HYDROcodone-acetaminophen [facility]) 5-325 MG tablet Take 1 tablet by mouth every 4 (four) hours as needed for moderate pain. Patient not taking: Reported on 05/12/2017 05/11/17   Merlyn Lot, MD  promethazine (PHENERGAN) 12.5 MG tablet Take 1 tablet (12.5 mg total) by mouth every 6 (six) hours as needed for nausea or vomiting. Patient not taking: Reported on 08/10/2017 05/11/17   Merlyn Lot, MD    Family History Family History  Problem Relation Age of Onset  . Ovarian cancer Mother   . Uterine cancer Mother   . Breast cancer Mother 70  . Diabetes Father   . Kidney disease Paternal Grandmother   . Breast cancer Maternal Aunt 60  . Esophageal cancer Neg Hx   . Colon cancer Neg Hx   . Stomach cancer Neg Hx   . Pancreatic cancer Neg Hx   . Liver disease Neg Hx   . Rectal cancer Neg Hx     Social History Social History   Tobacco Use  . Smoking status: Never Smoker  . Smokeless tobacco: Never Used  Substance Use Topics  .  Alcohol use: No  . Drug use: No     Allergies   Sulfa antibiotics   Review of Systems Review of Systems  Cardiovascular: Positive for leg swelling.  Musculoskeletal: Positive for myalgias.  All other systems reviewed and are negative.    Physical Exam Updated Vital Signs BP 125/75   Pulse 96   Temp 98.3 F (36.8 C) (Oral)   Resp (!) 30   SpO2 96%   Physical Exam  Constitutional: She is oriented to person, place, and time. She appears well-developed and well-nourished. No distress.  HENT:  Head: Normocephalic and atraumatic.  Right Ear: Hearing normal.  Left Ear: Hearing normal.  Nose: Nose normal.  Mouth/Throat: Oropharynx is clear and moist and mucous membranes are  normal.  Eyes: Pupils are equal, round, and reactive to light. Conjunctivae and EOM are normal.  Neck: Normal range of motion. Neck supple.  Cardiovascular: Regular rhythm, S1 normal and S2 normal. Exam reveals no gallop and no friction rub.  No murmur heard. Pulmonary/Chest: Effort normal and breath sounds normal. No respiratory distress. She exhibits no tenderness.  Abdominal: Soft. Normal appearance and bowel sounds are normal. There is no hepatosplenomegaly. There is no tenderness. There is no rebound, no guarding, no tenderness at McBurney's point and negative Murphy's sign. No hernia.  Musculoskeletal: Normal range of motion.  Neurological: She is alert and oriented to person, place, and time. She has normal strength. No cranial nerve deficit or sensory deficit. Coordination normal. GCS eye subscore is 4. GCS verbal subscore is 5. GCS motor subscore is 6.  Skin: Skin is warm, dry and intact. No rash noted. No cyanosis.  Psychiatric: She has a normal mood and affect. Her speech is normal and behavior is normal. Thought content normal.  Nursing note and vitals reviewed.    ED Treatments / Results  Labs (all labs ordered are listed, but only abnormal results are displayed) Labs Reviewed  BASIC METABOLIC PANEL - Abnormal; Notable for the following components:      Result Value   Potassium 3.3 (*)    Glucose, Bld 104 (*)    Creatinine, Ser 1.06 (*)    All other components within normal limits  CBC  D-DIMER, QUANTITATIVE (NOT AT [facility])  I-STAT TROPONIN, ED  I-STAT BETA HCG BLOOD, ED ([facility], [facility], [facility] ONLY)    EKG EKG Interpretation  Date/Time:  Monday August 09 2017 22:37:51 EDT Ventricular Rate:  70 PR Interval:  148 QRS Duration: 86 QT Interval:  384 QTC Calculation: 414 R Axis:   -9 Text Interpretation:  Normal sinus rhythm Cannot rule out Anterior infarct , age undetermined Abnormal ECG When compared with ECG of 07/29/2017, No significant change was found Confirmed by Delora Fuel  ([id number]) on 08/10/2017 12:56:43 AM   Radiology Dg Chest 2 View  Result Date: 08/09/2017 CLINICAL DATA:  Dyspnea and dizziness EXAM: CHEST - 2 VIEW COMPARISON:  None. FINDINGS: The heart size and mediastinal contours are within normal limits. Both lungs are clear. The visualized skeletal structures are unremarkable. IMPRESSION: No active cardiopulmonary disease. Electronically Signed   By: Ashley Royalty M.D.   On: 08/09/2017 23:01    Procedures Procedures (including critical care time)  Medications Ordered in ED Medications - No data to display   Initial Impression / Assessment and Plan / ED Course  I have reviewed the triage vital signs and the nursing notes.  Pertinent labs & imaging results that were available during my care of the patient were reviewed by me  and considered in my medical decision making (see chart for details).     Patient reports swelling of the right leg.  Do not appreciate any significant circumferential increase in the right leg compared to the left.  There are no venous cords, Homans sign negative.  Patient has 2+ dorsalis pedal pulse.  No erythema, induration or lymphangitis noted.  No joint pain or swelling.  Work-up is entirely normal.  This included a normal d-dimer.  Examination does not suggest DVT and with normal d-dimer, does not require venous duplex.  Patient reassured, she can continue outpatient follow-up with her cardiologist for the ongoing issues of chest pain, but she has not had any chest pain today or in the ER so did not require further work-up today.  Final Clinical Impressions(s) / ED Diagnoses   Final diagnoses:  Leg pain, anterior, right    ED Discharge Orders    None       Melvinia Ashby, Gwenyth Allegra, MD 08/10/17 [phone]

## 2017-09-22 ENCOUNTER — Ambulatory Visit
Admission: RE | Admit: 2017-09-22 | Discharge: 2017-09-22 | Disposition: A | Discharge status: Home / Self Care | Payer: BLUE CROSS/BLUE SHIELD | Source: Ambulatory Visit | Attending: Family Medicine | Admitting: Family Medicine

## 2017-09-22 DIAGNOSIS — N6489 Other specified disorders of breast: Secondary | ICD-10-CM | POA: Diagnosis present

## 2017-09-22 DIAGNOSIS — R928 Other abnormal and inconclusive findings on diagnostic imaging of breast: Secondary | ICD-10-CM

## 2017-09-22 HISTORY — PX: BREAST BIOPSY: SHX20

## 2017-09-23 LAB — SURGICAL PATHOLOGY

## 2019-05-18 ENCOUNTER — Ambulatory Visit: Payer: BLUE CROSS/BLUE SHIELD

## 2019-07-05 ENCOUNTER — Other Ambulatory Visit: Payer: Self-pay | Admitting: Nurse Practitioner

## 2019-07-05 DIAGNOSIS — Z1231 Encounter for screening mammogram for malignant neoplasm of breast: Secondary | ICD-10-CM

## 2019-08-07 ENCOUNTER — Ambulatory Visit: Payer: Self-pay | Admitting: General Surgery

## 2019-08-07 NOTE — H&P (Signed)
The patient is a 49 year old female who presents with a complaint of Rectal bleeding.Patient presents to the office with continued rectal bleeding. This has been going on intermittently for approximately 3-4 years. She underwent a colonoscopy of the bowel or GI and rubber band ligation approximately 3 years ago. She continued to have bleeding. She followed up with Dr. Alan Ripper who attempted rubber band ligation once again. Despite multiple episodes of rubber band ligation, she was unable to have any long lasting control of her rectal bleeding. She states that the bleeding is intermittent in nature and does not necessarily happen with bowel movements. She denies any constipation or straining. Her bowel movements are regular. She is not on any anticoagulants.   Past Surgical History Geni Bers [place], RMA; 08/07/2019 1:45 PM) Breast Biopsy Right. Cesarean Section - 1 Colon Polyp Removal - Colonoscopy Foot Surgery Right. Hysterectomy (not due to cancer) - Partial Oral Surgery  Diagnostic Studies History Geni Bers [place], RMA; 08/07/2019 1:45 PM) Colonoscopy within last year Mammogram 1-3 years ago Pap Smear >5 years ago  Allergies Marguarite [place], RMA; 08/07/2019 1:45 PM) Sulfa Antibiotics Hives. Allergies Reconciled  Medication History [employer], RMA; 08/07/2019 1:46 PM) Fluticasone Propionate (50MCG/ACT Suspension, Nasal) Active. SUMAtriptan Succinate (50MG  Tablet, Oral) Active. Pantoprazole Sodium (20MG  Tablet DR, Oral) Active. Proctozone-HC (2.5% Cream, External) Active. Famotidine (40MG  Tablet, Oral) Active. Allegra Allergy (Oral) Specific strength unknown - Active. Vitamin D (Ergocalciferol) (1.25 MG(50000 UT) Capsule, Oral) Active. Vitamin B12 (Oral) Specific strength unknown - Active. Medications Reconciled  Social History Geni Bers [place], RMA; 08/07/2019 1:45 PM) Alcohol use Occasional alcohol use. Caffeine use Carbonated  beverages. No drug use Tobacco use Never smoker.  Family History Geni Bers [place], RMA; 08/07/2019 1:45 PM) Alcohol Abuse Brother. Arthritis Father. Bleeding disorder Mother. Cerebrovascular Accident Father. Colon Cancer Mother. Hypertension Brother, Father. Migraine Headache Brother, Daughter. Ovarian Cancer Mother. Rectal Cancer Mother.  Pregnancy / Birth History Geni Bers [place], RMA; 08/07/2019 1:45 PM) Age at menarche 14 years. Contraceptive History Oral contraceptives. Gravida 2 Irregular periods Length (months) of breastfeeding 7-12 Maternal age 43-25 Para 42  Other Problems Geni Bers [place], RMA; 08/07/2019 1:45 PM) Anxiety Disorder Bladder Problems Gastroesophageal Reflux Disease Hemorrhoids Migraine Headache Ulcerative Colitis     Review of Systems Geni Bers [place] RMA; 08/07/2019 1:45 PM) General Not Present- Appetite Loss, Chills, Fatigue, Fever, Night Sweats, Weight Gain and Weight Loss. Skin Not Present- Change in Wart/Mole, Dryness, Hives, Jaundice, New Lesions, Non-Healing Wounds, Rash and Ulcer. HEENT Present- Ringing in the Ears and Seasonal Allergies. Not Present- Earache, Hearing Loss, Hoarseness, Nose Bleed, Oral Ulcers, Sinus Pain, Sore Throat, Visual Disturbances, Wears glasses/contact lenses and Yellow Eyes. Respiratory Not Present- Bloody sputum, Chronic Cough, Difficulty Breathing, Snoring and Wheezing. Breast Not Present- Breast Mass, Breast Pain, Nipple Discharge and Skin Changes. Cardiovascular Present- Leg Cramps. Not Present- Chest Pain, Difficulty Breathing Lying Down, Palpitations, Rapid Heart Rate, Shortness of Breath and Swelling of Extremities. Gastrointestinal Present- Abdominal Pain, Bloody Stool, Hemorrhoids, Nausea, Rectal Pain and Vomiting. Not Present- Bloating, Change in Bowel Habits, Chronic diarrhea, Constipation, Difficulty Swallowing, Excessive gas, Gets full quickly at meals and  Indigestion. Female Genitourinary Present- Pelvic Pain and Urgency. Not Present- Frequency, Nocturia and Painful Urination. Musculoskeletal Present- Back Pain and Joint Pain. Not Present- Joint Stiffness, Muscle Pain, Muscle Weakness and Swelling of Extremities. Neurological Present- Headaches, Numbness and Tingling. Not Present- Decreased Memory, Fainting, Seizures, Tremor, Trouble walking and Weakness. Psychiatric Not Present- Anxiety, Bipolar, Change in Sleep Pattern, Depression, Fearful and Frequent crying. Endocrine Not Present-  Cold Intolerance, Excessive Hunger, Hair Changes, Heat Intolerance, Hot flashes and New Diabetes. Hematology Present- Easy Bruising. Not Present- Blood Thinners, Excessive bleeding, Gland problems, HIV and Persistent Infections.  Vitals Geni Bers [place] RMA; 08/07/2019 1:47 PM) 08/07/2019 1:46 PM Weight: 227.8 lb Height: 66in Body Surface Area: 2.11 m Body Mass Index: 36.77 kg/m  Temp.: 98.31F(Temporal)  Pulse: 96 (Regular)  P.OX: 96% (Room air) BP: 120/88(Sitting, Left Arm, Standard)        Physical Exam Leighton Ruff MD; 2/29/7989 1:57 PM)  General Mental Status-Alert. General Appearance-Cooperative.  Rectal Anorectal Exam External - normal external exam. Internal - normal sphincter tone. Note: Soft stool in rectal vault.   Results Leighton Ruff MD; 04/06/9415 2:04 PM) Procedures  Name Value Date Hemorrhoids Procedure Other: Procedure: Anoscopy....Marland KitchenMarland KitchenSurgeon: Marcello Moores....Marland KitchenMarland KitchenAfter the risks and benefits were explained, verbal consent was obtained for above procedure. A medical assistant chaperone was present thoroughout the entire procedure. ....Marland KitchenMarland KitchenAnesthesia: none....Marland KitchenMarland KitchenDiagnosis: rectal bleeding....Marland KitchenMarland KitchenFindings: Grade 2 right posterior internal hemorrhoid, noninflamed  Performed: 08/07/2019 2:02 PM    Assessment & Plan Leighton Ruff MD; 06/01/1446 2:04 PM)  PAINLESS RECTAL BLEEDING (K62.5) Impression:  49 year old female with intermittent rectal bleeding without constipation who presents to the office for evaluation of surgical treatment of internal hemorrhoids. She has underwent multiple episodes of rubber band ligation with no success in treating her rectal bleeding. On exam today, she does have a prolapsing grade 2 hemorrhoid in the right posterior position that most likely is the source of her bleeding. We discussed hemorrhoidectomy versus trans-hemorrhoidal dearterialization. We discussed the postoperative pain and recovery of both surgeries. She would like to proceed with trans-hemorrhoidal dearterialization. We did discuss the remote chance that she could continue to have bleeding after this procedure and would need further endoscopic evaluation for possible sources upstream.

## 2019-08-24 DIAGNOSIS — Z1371 Encounter for nonprocreative screening for genetic disease carrier status: Secondary | ICD-10-CM

## 2019-08-24 DIAGNOSIS — Z803 Family history of malignant neoplasm of breast: Secondary | ICD-10-CM

## 2019-08-24 HISTORY — DX: Family history of malignant neoplasm of breast: Z80.3

## 2019-08-24 HISTORY — DX: Encounter for nonprocreative screening for genetic disease carrier status: Z13.71

## 2019-08-24 IMAGING — CR DG CHEST 2V
2 series · 2 of 2 positions shown · non-contrast
Comparison: Chest radiograph June 27, 2014

CLINICAL DATA: Hypertensive, dizziness.

EXAM:
CHEST - 2 VIEW

[chest pa]
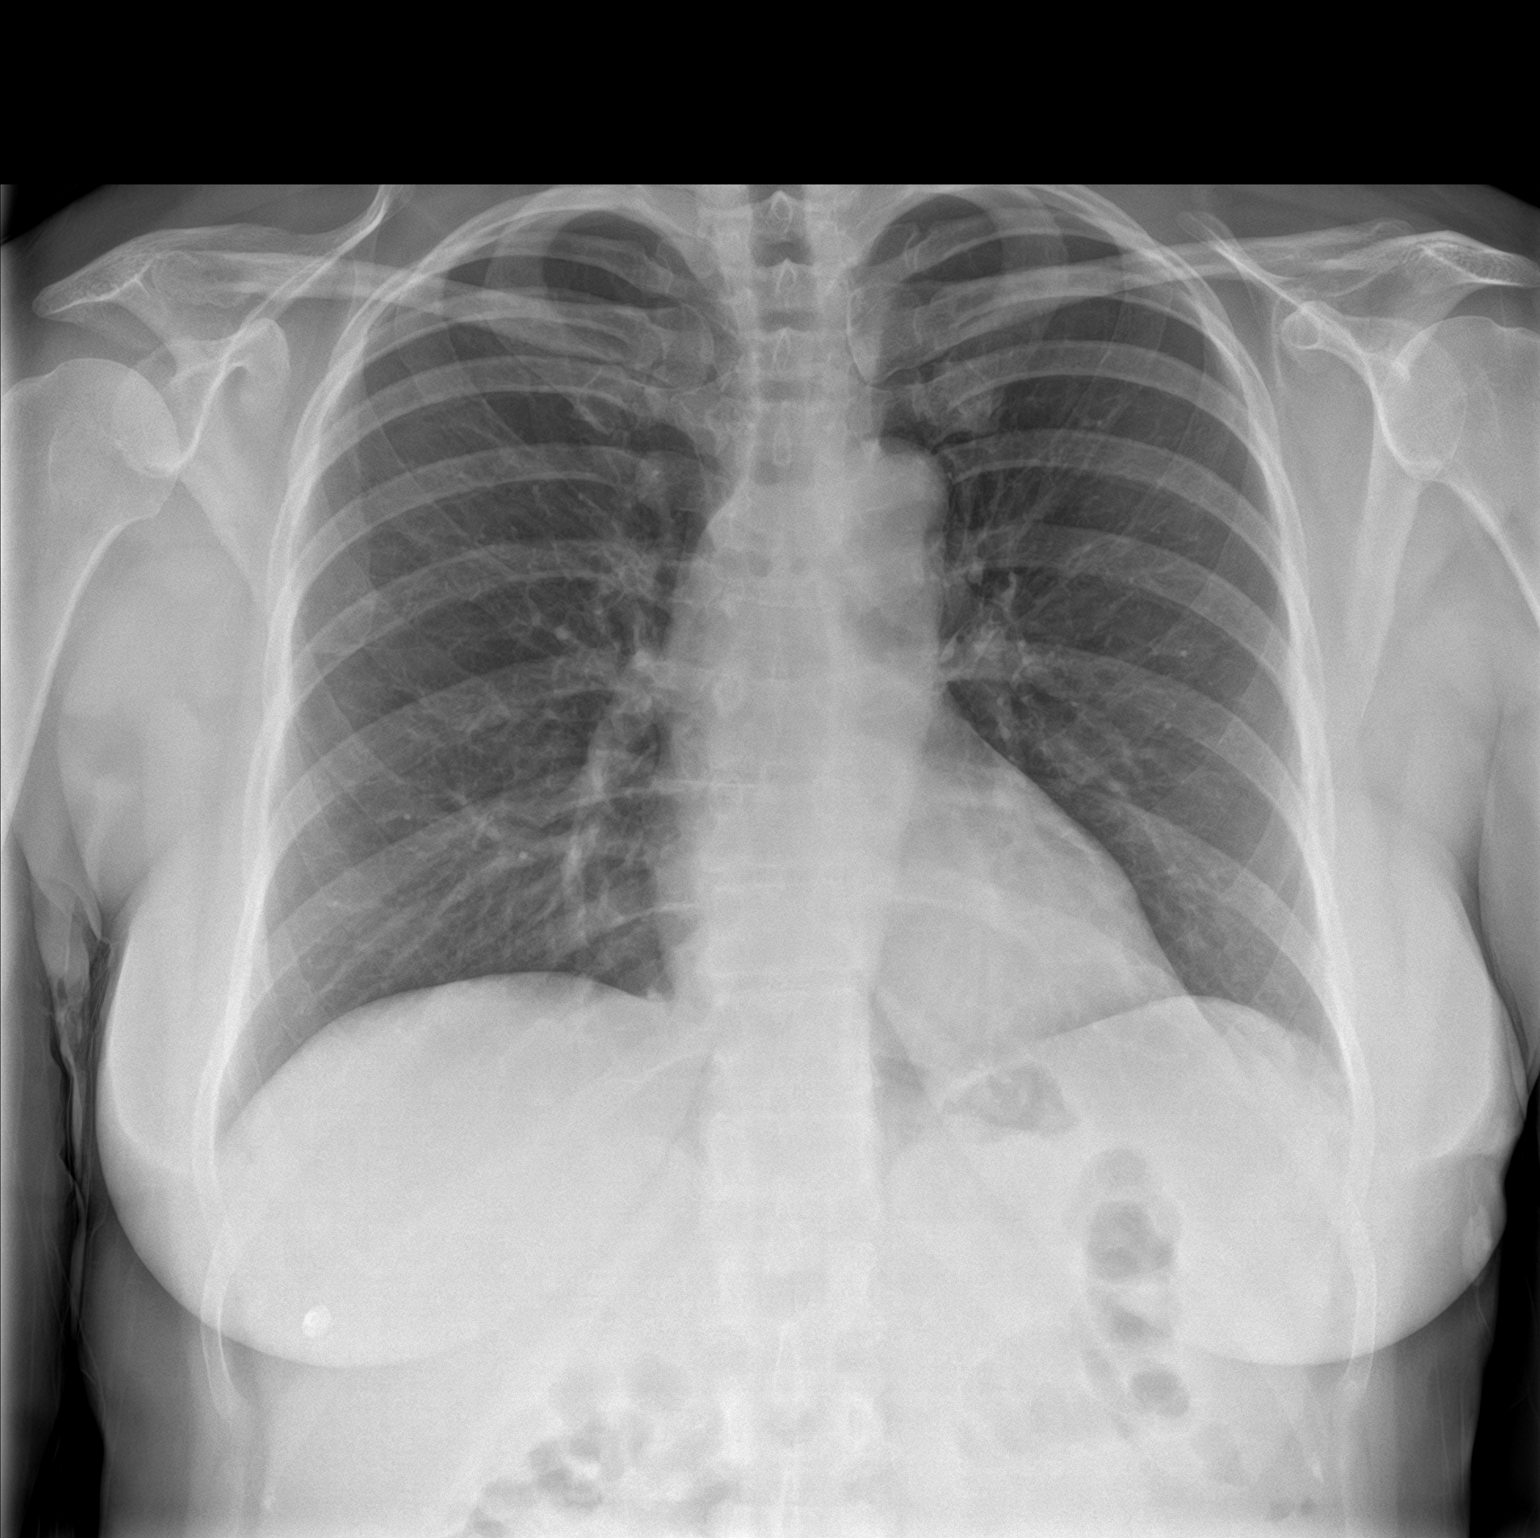

[chest lat]
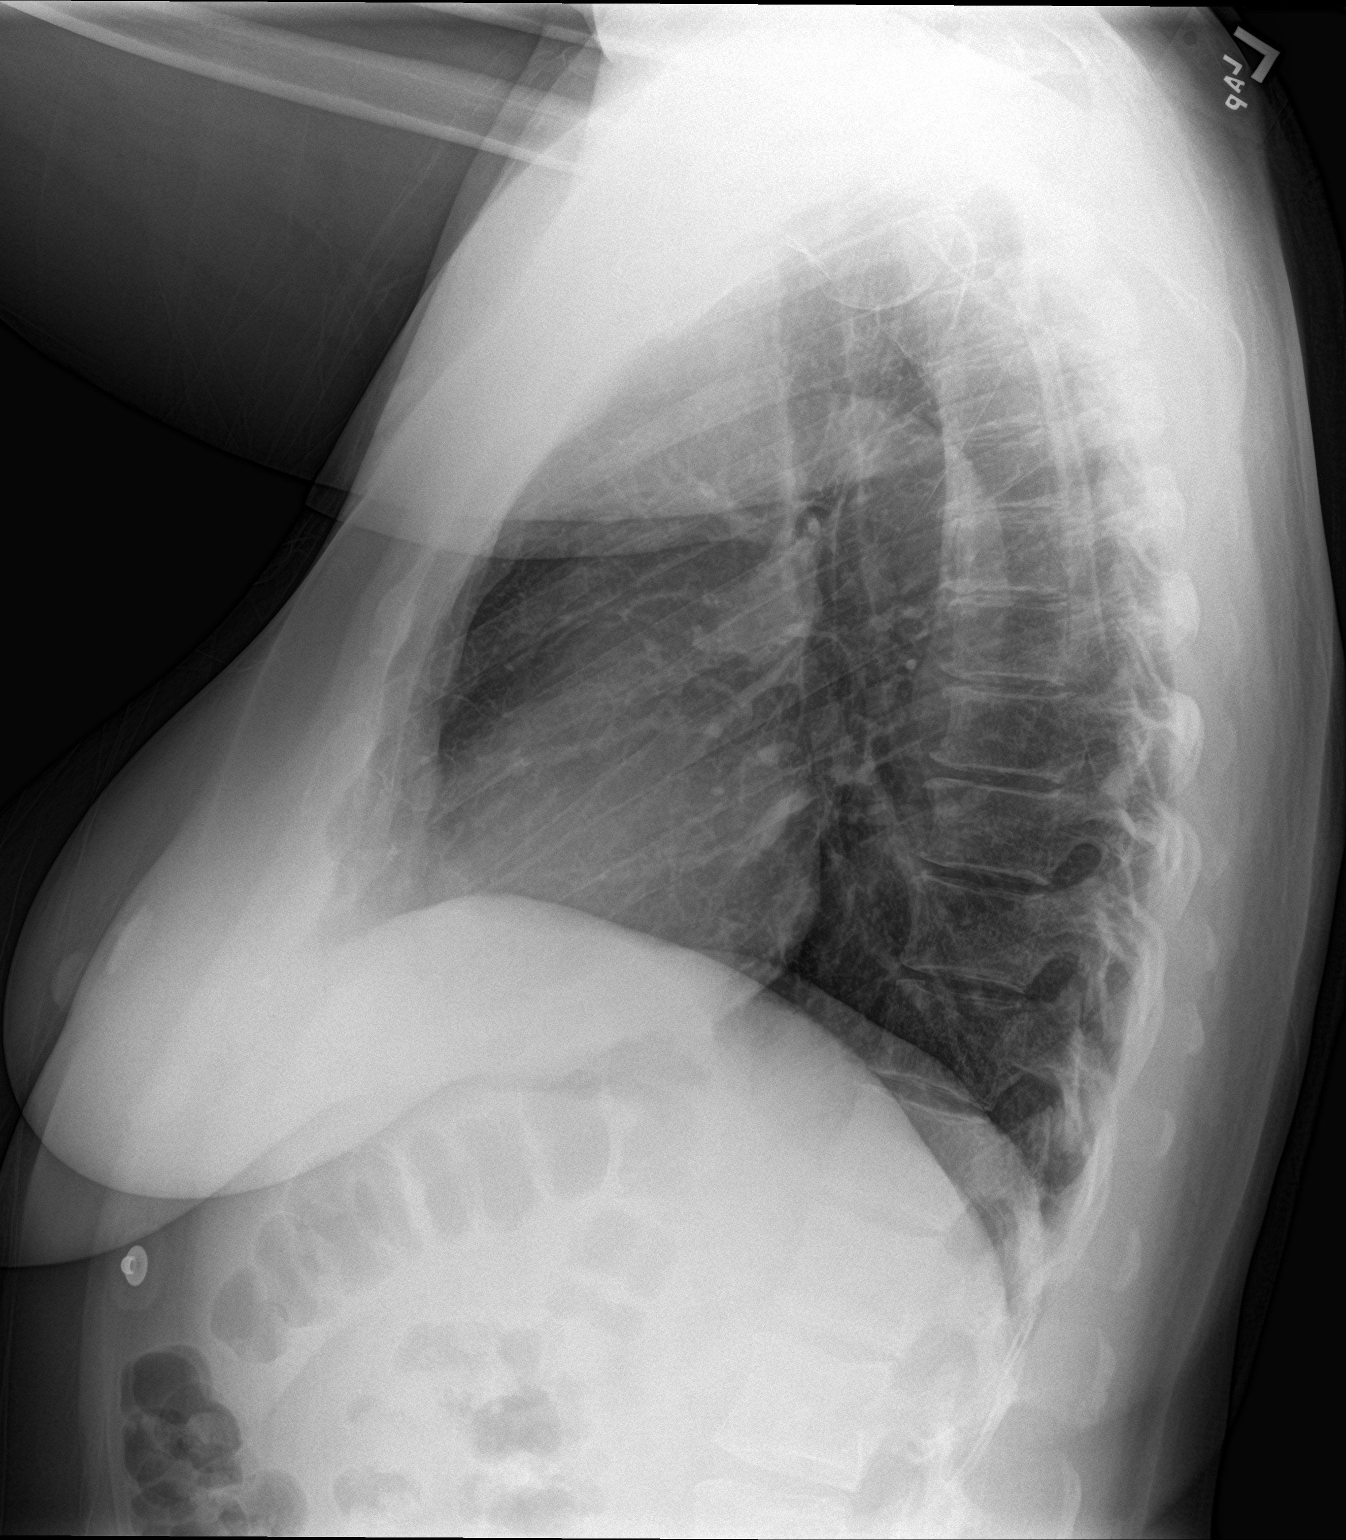

[2 of 2 positions shown; findings below may reference images not displayed]

FINDINGS: Cardiomediastinal silhouette is normal. No pleural effusions or
focal consolidations. Trachea projects midline and there is no
pneumothorax. Soft tissue planes and included osseous structures are
non-suspicious. Early degenerative changes of the thoracic spine.
IMPRESSION: Negative.

## 2019-09-05 ENCOUNTER — Emergency Department
Admission: EM | Admit: 2019-09-05 | Discharge: 2019-09-05 | Disposition: A | Discharge status: Home / Self Care | Payer: BC Managed Care – PPO | Attending: Emergency Medicine | Admitting: Emergency Medicine

## 2019-09-05 ENCOUNTER — Other Ambulatory Visit: Payer: Self-pay

## 2019-09-05 ENCOUNTER — Emergency Department: Payer: BC Managed Care – PPO

## 2019-09-05 DIAGNOSIS — R1011 Right upper quadrant pain: Secondary | ICD-10-CM | POA: Insufficient documentation

## 2019-09-05 DIAGNOSIS — Z90710 Acquired absence of both cervix and uterus: Secondary | ICD-10-CM | POA: Diagnosis not present

## 2019-09-05 DIAGNOSIS — R109 Unspecified abdominal pain: Secondary | ICD-10-CM

## 2019-09-05 LAB — URINALYSIS, COMPLETE (UACMP) WITH MICROSCOPIC
Bacteria, UA: NONE SEEN
Bilirubin Urine: NEGATIVE
Glucose, UA: NEGATIVE mg/dL
Hgb urine dipstick: NEGATIVE
Ketones, ur: NEGATIVE mg/dL
Leukocytes,Ua: NEGATIVE
Nitrite: NEGATIVE
Protein, ur: NEGATIVE mg/dL
Specific Gravity, Urine: 1.005 (ref 1.005–1.030)
WBC, UA: NONE SEEN WBC/hpf (ref 0–5)
pH: 5 (ref 5.0–8.0)

## 2019-09-05 LAB — COMPREHENSIVE METABOLIC PANEL
ALT: 13 U/L (ref 0–44)
AST: 15 U/L (ref 15–41)
Albumin: 4.1 g/dL (ref 3.5–5.0)
Alkaline Phosphatase: 58 U/L (ref 38–126)
Anion gap: 8 (ref 5–15)
BUN: 19 mg/dL (ref 6–20)
CO2: 26 mmol/L (ref 22–32)
Calcium: 9.1 mg/dL (ref 8.9–10.3)
Chloride: 105 mmol/L (ref 98–111)
Creatinine, Ser: 1.13 mg/dL — ABNORMAL HIGH (ref 0.44–1.00)
GFR calc Af Amer: >60 mL/min (ref >60)
GFR calc non Af Amer: 56 mL/min — ABNORMAL LOW (ref >60)
Glucose, Bld: 114 mg/dL — ABNORMAL HIGH (ref 70–99)
Potassium: 4 mmol/L (ref 3.5–5.1)
Sodium: 139 mmol/L (ref 135–145)
Total Bilirubin: 1 mg/dL (ref 0.3–1.2)
Total Protein: 7.3 g/dL (ref 6.5–8.1)

## 2019-09-05 LAB — CBC
HCT: 40 % (ref 36.0–46.0)
Hemoglobin: 13.6 g/dL (ref 12.0–15.0)
MCH: 28.6 pg (ref 26.0–34.0)
MCHC: 34 g/dL (ref 30.0–36.0)
MCV: 84 fL (ref 80.0–100.0)
Platelets: 218 10*3/uL (ref 150–400)
RBC: 4.76 MIL/uL (ref 3.87–5.11)
RDW: 12.3 % (ref 11.5–15.5)
WBC: 4.8 10*3/uL (ref 4.0–10.5)
nRBC: 0 % (ref 0.0–0.2)

## 2019-09-05 LAB — LIPASE, BLOOD: Lipase: 36 U/L (ref 11–51)

## 2019-09-05 MED ORDER — SODIUM CHLORIDE 0.9% FLUSH: 3.0000 mL | Freq: Once | INTRAVENOUS | Status: DC

## 2019-09-05 MED ORDER — ONDANSETRON HCL 4 MG/2ML IJ SOLN
4.0000 mg | Freq: Once | INTRAMUSCULAR | Status: AC
  Administered 2019-09-05: 4 mg via INTRAVENOUS
  Filled 2019-09-05: qty 2

## 2019-09-05 MED ORDER — MORPHINE SULFATE (PF) 4 MG/ML IV SOLN
4.0000 mg | Freq: Once | INTRAVENOUS | Status: AC
  Administered 2019-09-05: 4 mg via INTRAVENOUS
  Filled 2019-09-05: qty 1

## 2019-09-05 MED ORDER — TRAMADOL HCL 50 MG PO TABS: 50.0000 mg | ORAL_TABLET | Freq: Four times a day (QID) | ORAL | 0 refills | Status: DC | PRN

## 2019-09-05 MED ORDER — IOHEXOL 300 MG/ML  SOLN
100.0000 mL | Freq: Once | INTRAMUSCULAR | Status: AC | PRN
  Administered 2019-09-05: 100 mL via INTRAVENOUS
  Filled 2019-09-05: qty 100

## 2019-09-05 NOTE — ED Triage Notes (Signed)
Pt comes via POV from home with c/o RUQ pain that started 2 weeks ago.l pt states it has gotten worse since Wednesday. Pt states it come and goes.  Pt also states it radiates up her back. Pt states some nausea.

## 2019-09-05 NOTE — ED Provider Notes (Signed)
[facility] Emergency Department Provider Note   ____________________________________________    I have reviewed the triage vital signs and the nursing notes.   HISTORY  Chief Complaint RUQ     HPI Nancy Freeman is a 49 y.o. female with history as noted below who presents with complaints of right upper quadrant abdominal pain.  Patient reports pain has been ongoing for about 2 weeks, possibly related to p.o. intake.  Reports that she had an ultrasound with PCP which demonstrated a normal gallbladder.  Denies fevers or chills.  Denies a history of abdominal surgery besides a C-section.  No dysuria has not take anything today for this  Past Medical History:  Diagnosis Date  . Allergy   . Anxiety   . Headache   . Hidradenoma VULVAR  . IBS (irritable bowel syndrome)   . Internal hemorrhoid     Patient Active Problem List   Diagnosis Date Noted  . Prolapsed internal hemorrhoids, grade 2 05/19/2016    Past Surgical History:  Procedure Laterality Date  . ABDOMINAL HYSTERECTOMY  01-19-2001  DR LOMAX   W/ LEFT SALPINGO-OOPHORECTOMY AND EXCISION ENDOMETRIOSIS  . BREAST BIOPSY Left 09/22/2017   affirm bx, path pending  . CESAREAN SECTION    . COLONOSCOPY    . HEMORRHOID BANDING  2018  . REMOVAL OF SIDE PLATE, DISTAL FIBULA, RIGHT ANKLE  05-08-2004  . UPPER GASTROINTESTINAL ENDOSCOPY      Prior to Admission medications   Medication Sig Start Date End Date Taking? Authorizing Provider  albuterol (VENTOLIN HFA) 108 (90 Base) MCG/ACT inhaler Inhale 2 puffs into the lungs every 4 (four) hours as needed for wheezing or shortness of breath. 08/30/19   [provider]  SUMAtriptan (IMITREX) 50 MG tablet Take 50 mg by mouth every 2 (two) hours as needed for migraine. May repeat in 2 hours if headache persists or recurs.    [provider]  traMADol (ULTRAM) 50 MG tablet Take 1 tablet (50 mg total) by mouth every 6 (six) hours as  needed. 09/05/19 09/04/20  Lavonia Drafts, MD     Allergies Sulfa antibiotics  Family History  Problem Relation Age of Onset  . Ovarian cancer Mother   . Uterine cancer Mother   . Breast cancer Mother 65  . Diabetes Father   . Kidney disease Paternal Grandmother   . Breast cancer Maternal Aunt 60  . Esophageal cancer Neg Hx   . Colon cancer Neg Hx   . Stomach cancer Neg Hx   . Pancreatic cancer Neg Hx   . Liver disease Neg Hx   . Rectal cancer Neg Hx     Social History Social History   Tobacco Use  . Smoking status: Never Smoker  . Smokeless tobacco: Never Used  Vaping Use  . Vaping Use: Never used  Substance Use Topics  . Alcohol use: No  . Drug use: No    Review of Systems  Constitutional: No fever/chills Eyes: No visual changes.  ENT: No sore throat. Cardiovascular: Denies chest pain. Respiratory: Denies shortness of breath. Gastrointestinal: As above Genitourinary: Negative for dysuria. Musculoskeletal: Negative for back pain. Skin: Negative for rash. Neurological: Negative for headaches   ____________________________________________   PHYSICAL EXAM:  VITAL SIGNS: ED Triage Vitals  Enc Vitals Group     BP 09/05/19 1418 (!) 150/108     Pulse Rate 09/05/19 1418 85     Resp 09/05/19 1418 19     Temp 09/05/19 1418 98  F (36.7 C)     Temp Source 09/05/19 1843 Oral     SpO2 09/05/19 1418 95 %     Weight 09/05/19 1418 104.3 kg (230 lb)     Height 09/05/19 1418 1.676 m (5\' 6" )     Head Circumference --      Peak Flow --      Pain Score 09/05/19 1418 5     Pain Loc --      Pain Edu? --      Excl. in [place]? --     Constitutional: Alert and oriented.   Nose: No congestion/rhinnorhea. Mouth/Throat: Mucous membranes are moist.    Cardiovascular: Normal rate, regular rhythm. Grossly normal heart sounds.  Good peripheral circulation. Respiratory: Normal respiratory effort.  No retractions. Lungs CTAB. Gastrointestinal: Mild right upper quadrant  abdominal tenderness. no distention.  Mild right CVA tenderness  Musculoskeletal:   Warm and well perfused Neurologic:  Normal speech and language. No gross focal neurologic deficits are appreciated.  Skin:  Skin is warm, dry and intact. No rash noted. Psychiatric: Mood and affect are normal. Speech and behavior are normal.  ____________________________________________   LABS (all labs ordered are listed, but only abnormal results are displayed)  Labs Reviewed  COMPREHENSIVE METABOLIC PANEL - Abnormal; Notable for the following components:      Result Value   Glucose, Bld 114 (*)    Creatinine, Ser 1.13 (*)    GFR calc non Af Amer 56 (*)    All other components within normal limits  URINALYSIS, COMPLETE (UACMP) WITH MICROSCOPIC - Abnormal; Notable for the following components:   Color, Urine STRAW (*)    APPearance CLEAR (*)    All other components within normal limits  LIPASE, BLOOD  CBC   ____________________________________________  EKG  None ____________________________________________  RADIOLOGY  CT abdomen pelvis without acute abnormality ____________________________________________   PROCEDURES  Procedure(s) performed: No  Procedures   Critical Care performed: No ____________________________________________   INITIAL IMPRESSION / ASSESSMENT AND PLAN / ED COURSE  Pertinent labs & imaging results that were available during my care of the patient were reviewed by me and considered in my medical decision making (see chart for details).  Patient presents with right upper quadrant abdominal pain and some CVA tenderness on the right.  Differential includes cholelithiasis although reported normal ultrasound of PCP.  Kidney stone, pyelonephritis  Lab work is quite reassuring, normal white blood cell count, normal urinalysis, normal BMP with normal LFTs  Patient treated with IV morphine, IV Zofran and is feeling significantly better  CT abdomen pelvis  negative for acute abnormality.  Patient has outpatient GI arranged, appropriate for discharge at this time.   ____________________________________________   FINAL CLINICAL IMPRESSION(S) / ED DIAGNOSES  Final diagnoses:  Abdominal pain, unspecified abdominal location        Note:  This document was prepared using Dragon voice recognition software and may include unintentional dictation errors.   Lavonia Drafts, MD 09/05/19 [phone]

## 2019-09-05 NOTE — ED Notes (Signed)
Pt states having right side pain that started a couple weeks ago. Patient states she's had rectal bleeding for the last year. Patient also states she got a call about her results from a few days ago and that she has an enlarged liver.

## 2019-09-13 ENCOUNTER — Ambulatory Visit (INDEPENDENT_AMBULATORY_CARE_PROVIDER_SITE_OTHER): Payer: BC Managed Care – PPO | Admitting: Obstetrics and Gynecology

## 2019-09-13 ENCOUNTER — Telehealth: Payer: Self-pay | Admitting: Obstetrics and Gynecology

## 2019-09-13 ENCOUNTER — Encounter: Payer: Self-pay | Admitting: Obstetrics and Gynecology

## 2019-09-13 ENCOUNTER — Other Ambulatory Visit: Payer: Self-pay

## 2019-09-13 VITALS — BP 120/80 | Ht 66.0 in | Wt 227.0 lb

## 2019-09-13 DIAGNOSIS — Z8041 Family history of malignant neoplasm of ovary: Secondary | ICD-10-CM | POA: Diagnosis not present

## 2019-09-13 DIAGNOSIS — R102 Pelvic and perineal pain: Secondary | ICD-10-CM | POA: Diagnosis not present

## 2019-09-13 DIAGNOSIS — Z1239 Encounter for other screening for malignant neoplasm of breast: Secondary | ICD-10-CM

## 2019-09-13 DIAGNOSIS — Z01419 Encounter for gynecological examination (general) (routine) without abnormal findings: Secondary | ICD-10-CM

## 2019-09-13 NOTE — Telephone Encounter (Signed)
Called and left voicemail for patient to call back to be scheduled. 

## 2019-09-13 NOTE — Progress Notes (Signed)
Gynecology Annual Exam  PCP: Lavonne Chick, MD (Inactive)  Chief Complaint:  Chief Complaint  Patient presents with  . Gynecologic Exam    History of Present Illness:Patient is a 49 y.o. presents for annual exam. The patient has no complaints today. She has noted intermittent right pelvic pain.  No associated GI or GU symptoms.  She does have a positive family history of ovarian cancer and inquires about CA-125 screening  LMP: No LMP recorded. Patient has had a hysterectomy. She also underwent prior left oophorectomy.    The patient is sexually active. She denies dyspareunia.  The patient does perform self breast exams.  There is notable family history of breast or ovarian cancer in her family.  Mother with ovarian cancer diagnosed age 53 deceased age 76.  The patient wears seatbelts: yes.   The patient has regular exercise: not asked.    The patient denies current symptoms of depression.     Review of Systems: Review of Systems  Constitutional: Negative for chills and fever.  HENT: Negative for congestion.   Respiratory: Negative for cough and shortness of breath.   Cardiovascular: Negative for chest pain and palpitations.  Gastrointestinal: Positive for abdominal pain. Negative for constipation, diarrhea, heartburn, nausea and vomiting.  Genitourinary: Negative for dysuria, frequency and urgency.  Skin: Negative for itching and rash.  Neurological: Negative for dizziness and headaches.  Endo/Heme/Allergies: Negative for polydipsia.  Psychiatric/Behavioral: Negative for depression.    Past Medical History:  Patient Active Problem List   Diagnosis Date Noted  . Prolapsed internal hemorrhoids, grade 2 05/19/2016    All 3 positions, right anterior banded 05/01/2016 and right posterior and lateral banded 05/19/2016      Past Surgical History:  Past Surgical History:  Procedure Laterality Date  . ABDOMINAL HYSTERECTOMY  01-19-2001  DR LOMAX   W/ LEFT  SALPINGO-OOPHORECTOMY AND EXCISION ENDOMETRIOSIS  . BREAST BIOPSY Left 09/22/2017   affirm bx, path pending  . CESAREAN SECTION    . COLONOSCOPY    . HEMORRHOID BANDING  2018  . REMOVAL OF SIDE PLATE, DISTAL FIBULA, RIGHT ANKLE  05-08-2004  . UPPER GASTROINTESTINAL ENDOSCOPY      Gynecologic History:  No LMP recorded. Patient has had a hysterectomy. 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  Family History:  Family History  Problem Relation Age of Onset  . Ovarian cancer Mother   . Uterine cancer Mother   . Breast cancer Mother 1  . Diabetes Father   . Kidney disease Paternal Grandmother   . Breast cancer Maternal Aunt 60  . Esophageal cancer Neg Hx   . Colon cancer Neg Hx   . Stomach cancer Neg Hx   . Pancreatic cancer Neg Hx   . Liver disease Neg Hx   . Rectal cancer Neg Hx     Social History:  Social History   Socioeconomic History  . Marital status: Married    Spouse name: Not on file  . Number of children: 1  . Years of education: BS  . Highest education level: Not on file  Occupational History  . 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 Smoking status: Never Smoker  . Smokeless tobacco: Never Used  Vaping Use  . Vaping Use: Never used  Substance  and Sexual Activity  . Alcohol use: No  . Drug use: No  . Sexual activity: Yes  Other Topics Concern  . 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 Difficulty of Paying Living Expenses:   Food Insecurity:   . Worried About Charity fundraiser in the Last Year:   . Arboriculturist in the Last Year:   Transportation  Needs:   . Film/video editor (Medical):   Marland Kitchen Lack of Transportation (Non-Medical):   Physical Activity:   . Days of Exercise per Week:   . Minutes of Exercise per Session:   Stress:   . Feeling of Stress :   Social Connections:   . Frequency of Communication with Friends and Family:   . Frequency of Social Gatherings with Friends and Family:   . Attends Religious Services:   . Active Member of Clubs or Organizations:   . Attends Archivist Meetings:   Marland Kitchen Marital Status:   Intimate Partner Violence:   . Fear of Current or Ex-Partner:   . Emotionally Abused:   Marland Kitchen Physically Abused:   . Sexually Abused:     Allergies:  Allergies  Allergen Reactions  . Sulfa Antibiotics Hives and Itching    Medications: Prior to Admission medications   Medication Sig Start Date End Date Taking? Authorizing Provider  albuterol (VENTOLIN HFA) 108 (90 Base) MCG/ACT inhaler Inhale 2 puffs into the lungs every 4 (four) hours as needed for wheezing or shortness of breath. 08/30/19   [provider]  SUMAtriptan (IMITREX) 50 MG tablet Take 50 mg by mouth every 2 (two) hours as needed for migraine. May repeat in 2 hours if headache persists or recurs.    [provider]  traMADol (ULTRAM) 50 MG tablet Take 1 tablet (50 mg total) by mouth every 6 (six) hours as needed. 09/05/19 09/04/20  Lavonia Drafts, MD    Physical Exam Vitals: Blood pressure 120/80, height _0  (1.676 m), weight 227 lb (103 kg).   General: NAD HEENT: normocephalic, anicteric Thyroid: no enlargement, no palpable nodules Pulmonary: No increased work of breathing, CTAB Cardiovascular: RRR, distal pulses 2+ Breast: Breast symmetrical, no tenderness, no palpable nodules or masses, no skin or nipple retraction present, no nipple discharge.  No axillary or supraclavicular lymphadenopathy. Abdomen: NABS, soft, non-tender, non-distended.  Umbilicus without lesions.  No hepatomegaly, splenomegaly or masses  palpable. No evidence of hernia  Genitourinary:  External: Normal external female genitalia.  Normal urethral meatus, normal Bartholin's and Skene's glands.    Vagina: Normal vaginal mucosa, no evidence of prolapse.    Cervix: absent  Uterus: absent  Adnexa: some fullness is appreciated over the right adnexa  Rectal: deferred  Lymphatic: no evidence of inguinal lymphadenopathy Extremities: no edema, erythema, or tenderness Neurologic: Grossly intact Psychiatric: mood appropriate, affect full  Female chaperone present for pelvic and breast  portions of the physical exam     Assessment: 49 y.o.  routine annual exam, pelvic pain  Plan: Problem List Items Addressed This Visit    None    Visit Diagnoses    Encounter for gynecological examination without abnormal finding    -  Primary   Breast screening       Relevant Orders   MM 3D SCREEN BREAST BILATERAL   Female pelvic  pain       Relevant Orders   US Transvaginal Non-OB   FSH   Estradiol   Family history of ovarian cancer       Relevant Orders   Integrated BRACAnalysis ([place])      1) Mammogram - recommend yearly screening mammogram.  Mammogram Was ordered today  2) STI screening  was notoffered and therefore not obtained  3) ASCCP guidelines and rational discussed.  Patient opts for discontinue secondary to prior hysterectomy screening interval  4) Osteoporosis  - per USPTF routine screening DEXA at age 32  5) Routine healthcare maintenance including cholesterol, diabetes screening discussed managed by PCP  6) Colonoscopy - UTD had at age 49 per patient report  7) Pelvic pain - discussed no proven utility in obtaining CA-125 as not very specific and high false positive rate.   - TVUS ordered - Counseling BRCA.  I offeredMyRisk testing because of her significant family history of breast and/or ovarian cancer.  If a mutation is found, the patient would be advised of an increased risk of breast  cancer and ovarian cancer.  If a mutation is identified other family members could be tested and would have the opportunity to take advantage of approaches to prevention and early detection of breast and ovarian cancer.     8) Return in about 1 week (around 09/20/2019) for TVUS and gyn follow up.    Malachy Mood, MD [place], [facility]

## 2019-09-13 NOTE — Telephone Encounter (Signed)
-----   Message from Malachy Mood, MD sent at 09/13/2019  1:32 PM EDT ----- Regarding: TVUS and follow up Needs TVUS and follow up 1 week forgot to put in her follow up before she left today

## 2019-09-13 NOTE — Patient Instructions (Signed)
[facility] [address] [place] [address]  [facility] [place]  [address] [place] [address]  Phone: [phone]  

## 2019-09-14 LAB — ESTRADIOL: Estradiol: 21.4 pg/mL

## 2019-09-14 LAB — FOLLICLE STIMULATING HORMONE: FSH: 103 m[IU]/mL

## 2019-09-18 ENCOUNTER — Ambulatory Visit (INDEPENDENT_AMBULATORY_CARE_PROVIDER_SITE_OTHER): Payer: BC Managed Care – PPO | Admitting: Obstetrics and Gynecology

## 2019-09-18 ENCOUNTER — Ambulatory Visit (INDEPENDENT_AMBULATORY_CARE_PROVIDER_SITE_OTHER): Payer: BC Managed Care – PPO

## 2019-09-18 ENCOUNTER — Other Ambulatory Visit: Payer: Self-pay

## 2019-09-18 ENCOUNTER — Encounter: Payer: Self-pay | Admitting: Obstetrics and Gynecology

## 2019-09-18 VITALS — BP 110/60 | Ht 66.0 in | Wt 231.0 lb

## 2019-09-18 DIAGNOSIS — R102 Pelvic and perineal pain: Secondary | ICD-10-CM | POA: Diagnosis not present

## 2019-09-18 DIAGNOSIS — Z8041 Family history of malignant neoplasm of ovary: Secondary | ICD-10-CM | POA: Insufficient documentation

## 2019-09-18 NOTE — Progress Notes (Signed)
Gynecology Ultrasound Follow Up  Chief Complaint:  Chief Complaint  Patient presents with  . Follow-up    u/s     History of Present Illness: Patient is a 49 y.o. female who presents today for ultrasound evaluation of pelvic pain in the setting of prior hysterectomy, left oophorectomy, and maternal history of ovarian cancer .  Ultrasound demonstrates the following findgins Adnexa: no masses Uterus: surgically absent Additional: no free fluid  Review of Systems: Review of Systems  Constitutional: Negative.   Gastrointestinal: Positive for abdominal pain. Negative for constipation, diarrhea, nausea and vomiting.  Genitourinary: Negative.     Past Medical History:  Past Medical History:  Diagnosis Date  . Allergy   . Anxiety   . Headache   . Hidradenoma VULVAR  . IBS (irritable bowel syndrome)   . Internal hemorrhoid     Past Surgical History:  Past Surgical History:  Procedure Laterality Date  . ABDOMINAL HYSTERECTOMY  01-19-2001  DR LOMAX   W/ LEFT SALPINGO-OOPHORECTOMY AND EXCISION ENDOMETRIOSIS  . BREAST BIOPSY Left 09/22/2017   affirm bx, path pending  . CESAREAN SECTION    . COLONOSCOPY    . HEMORRHOID BANDING  2018  . REMOVAL OF SIDE PLATE, DISTAL FIBULA, RIGHT ANKLE  05-08-2004  . UPPER GASTROINTESTINAL ENDOSCOPY      Gynecologic History:  No LMP recorded. Patient has had a hysterectomy. Contraception: status post hysterectomy Last Pap: N/A s/p hysterectomy  Family History:  Family History  Problem Relation Age of Onset  . Ovarian cancer Mother   . Uterine cancer Mother   . Breast cancer Mother 90  . Diabetes Father   . Kidney disease Paternal Grandmother   . Breast cancer Maternal Aunt 60  . Esophageal cancer Neg Hx   . Colon cancer Neg Hx   . Stomach cancer Neg Hx   . Pancreatic cancer Neg Hx   . Liver disease Neg Hx   . Rectal cancer Neg Hx     Social History:  Social History   Socioeconomic History  . Marital status: Married     Spouse name: Not on file  . Number of children: 1  . Years of education: BS  . Highest education level: Not on file  Occupational History  . 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 Smoking status: Never Smoker  . Smokeless tobacco: Never Used  Vaping Use  . Vaping Use: Never used  Substance and Sexual Activity  . Alcohol use: No  . Drug use: No  . Sexual activity: Yes  Other Topics Concern  . 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 Difficulty of Paying Living Expenses:   Food Insecurity:   . Worried About Charity fundraiser in the Last Year:   . Arboriculturist in the Last Year:   Transportation Needs:   . Film/video editor (Medical):   Marland Kitchen Lack of Transportation (Non-Medical):   Physical Activity:   . Days of Exercise per Week:   . Minutes of Exercise per Session:   Stress:   . Feeling of Stress :   Social Connections:   . Frequency of Communication with Friends and Family:   . Frequency of Social Gatherings with Friends and Family:   . Attends  Religious Services:   . Active Member of Clubs or Organizations:   . Attends Archivist Meetings:   Marland Kitchen Marital Status:   Intimate Partner Violence:   . Fear of Current or Ex-Partner:   . Emotionally Abused:   Marland Kitchen Physically Abused:   . Sexually Abused:     Allergies:  Allergies  Allergen Reactions  . Sulfa Antibiotics Hives and Itching    Medications: Prior to Admission medications   Medication Sig Start Date End Date Taking? Authorizing Provider  SUMAtriptan (IMITREX) 50 MG tablet Take 50 mg by mouth every 2 (two) hours as needed for migraine. May repeat in 2 hours if headache persists or recurs.   Yes [provider]  traMADol (ULTRAM) 50 MG tablet Take 1 tablet (50 mg total) by mouth every 6 (six) hours as  needed. 09/05/19 09/04/20 Yes Lavonia Drafts, MD    Physical Exam Vitals: Blood pressure (!) 110/60, height 5\' 6"  (1.676 m), weight (!) 231 lb (104.8 kg).  General: NAD HEENT: normocephalic, anicteric Pulmonary: No increased work of breathing Extremities: no edema, erythema, or tenderness Neurologic: Grossly intact, normal gait Psychiatric: mood appropriate, affect full  US Transvaginal Non-OB  Result Date: 09/18/2019 Patient Name: Nancy Freeman DOB: Mar 02, 1970 MRN: [id number] ULTRASOUND REPORT Location: [facility] Date of Service: 09/18/2019 Indications:Pelvic Pain Findings: The uterus and cervix are absent. No ovaries are seen. Survey of the adnexa demonstrates no adnexal masses. There is no free fluid in the cul de sac. Impression: 1. Normal pelvic ultrasound. 2. Hysterectomy noted. Recommendations: 1.Clinical correlation with the patient's History and Physical Exam. Gweneth Dimitri, RT Images reviewed.  Normal GYN study without visualized pathology.  Malachy Mood, MD, [facility], [facility] 09/18/2019, 8:47 PM   CT ABDOMEN PELVIS W CONTRAST  Result Date: 09/05/2019 CLINICAL DATA:  Right upper quadrant pain for 2 weeks EXAM: CT ABDOMEN AND PELVIS WITH CONTRAST TECHNIQUE: Multidetector CT imaging of the abdomen and pelvis was performed using the standard protocol following bolus administration of intravenous contrast. CONTRAST:  120mL OMNIPAQUE IOHEXOL 300 MG/ML  SOLN COMPARISON:  05/26/2010 FINDINGS: Lower chest: No acute pleural or parenchymal lung disease. Hepatobiliary: Scattered hepatic cysts are unchanged. Gallbladder is normal. Pancreas: Unremarkable. No pancreatic ductal dilatation or surrounding inflammatory changes. Spleen: Normal in size without focal abnormality. Adrenals/Urinary Tract: Adrenal glands are unremarkable. Kidneys are normal, without renal calculi, focal lesion, or hydronephrosis. Bladder is unremarkable. Stomach/Bowel: No bowel  obstruction or ileus. Minimal descending colonic diverticulosis without diverticulitis. No wall thickening or inflammatory change. Normal appendix right lower quadrant. Vascular/Lymphatic: No significant vascular findings are present. No enlarged abdominal or pelvic lymph nodes. Reproductive: Status post hysterectomy. No adnexal masses. Other: No abdominal wall hernia or abnormality. No abdominopelvic ascites. Musculoskeletal: No acute or destructive bony lesions. Reconstructed images demonstrate no additional findings. IMPRESSION: 1. No acute intra-abdominal or intrapelvic process. 2. Minimal descending colonic diverticulosis without diverticulitis. Electronically Signed   By: Randa Ngo M.D.   On: 09/05/2019 17:47     Assessment: 49 y.o. G1P1001 presenting for follow up of pelvic pain  Plan: Problem List Items Addressed This Visit      Other   Family history of ovarian cancer    Other Visit Diagnoses    Pelvic pain    -  Primary      1)  Pelvic pain - no evidence of ovarian masses.  Patient  Is still awaiting results of MyRisk testing.  She is ultimately interested in risk reducing  oophorectomy of her remaining ovary secondary to her maternal history of ovarian cancer.  Discussed that [facility] results will be beneficial in determining need to microsection specimen. -Post for right oophorectomy pending MyRisk results  2) A total of 15 minutes were spent in face-to-face contact with the patient during this encounter with over half of that time devoted to counseling and coordination of care.  3) Return will be called with preop information.   Malachy Mood, MD, [facility], [facility] 09/18/2019, 3:43 PM

## 2019-09-27 ENCOUNTER — Encounter: Payer: Self-pay | Admitting: Obstetrics and Gynecology

## 2019-10-13 ENCOUNTER — Encounter: Payer: Self-pay | Admitting: Obstetrics and Gynecology

## 2019-10-13 DIAGNOSIS — Z1371 Encounter for nonprocreative screening for genetic disease carrier status: Secondary | ICD-10-CM | POA: Insufficient documentation

## 2020-03-08 ENCOUNTER — Other Ambulatory Visit: Payer: Self-pay

## 2020-03-08 ENCOUNTER — Ambulatory Visit (INDEPENDENT_AMBULATORY_CARE_PROVIDER_SITE_OTHER): Payer: BC Managed Care – PPO | Admitting: Obstetrics and Gynecology

## 2020-03-08 ENCOUNTER — Encounter: Payer: Self-pay | Admitting: Obstetrics and Gynecology

## 2020-03-08 VITALS — Wt 231.0 lb

## 2020-03-08 DIAGNOSIS — R1011 Right upper quadrant pain: Secondary | ICD-10-CM | POA: Diagnosis not present

## 2020-03-08 DIAGNOSIS — N3946 Mixed incontinence: Secondary | ICD-10-CM | POA: Diagnosis not present

## 2020-03-08 NOTE — Progress Notes (Signed)
Obstetrics & Gynecology Office Visit   Chief Complaint:  Chief Complaint  Patient presents with  . Bladder Prolapse    Having accidents during the day, walking, coughing, sneezing and leaking urine. Rt upper quadrant pain. RM 6    History of Present Illness:  Nancy Freeman is a 50 y.o. G40P1001 female who presents self referred for evaluation of urgency and stress incontinence.   She reports that she has had the above symptoms for several months  Urinary Leakage Symptoms:  She reports that she does have urinary incontinence.  She does wear a pad.    Stress incontinence:  She does report stress urinary leakage and reports this to be severe bother.    Urgency incontinence: She does report urine leakage with urgency and reports this to be mild bother.    Stress incontinence predominates.  She has tried at home [employer] .  Feel like the volume of urine leakage has increased over the past few months.   No prior vaginal deliveries history of C-section, also status post prior hysterectomy.    Does occasionally have to double void    Bulge Symptoms:  She denies pressure/bulge symptoms.     Bowel Symptoms:  No problems with constipation.  Up to date on colonoscopy.  Has had several banding procedure for rectal hemerrhoids and rectal bleeding.  This summer has ER visit for RUQ pain, CT scan at that time was normal, as were LFT's  .  Fecal incontinence: She denies encopresis    Defecatory dysfunction: She does not splint to accomplish a bowel movement.  She denies incomplete emptying.   Review of Systems: Review of systems negative unless noted in HPI  Past Medical History:  Patient Active Problem List   Diagnosis Date Noted  . BRCA negative 10/13/2019    MyRiad Myrisk 09/13/2019 negative. Lifetime Risk of breast cancer 14.9%   . Family history of ovarian cancer 09/18/2019  . Prolapsed internal hemorrhoids, grade 2 05/19/2016    All 3 positions, right anterior banded 05/01/2016  and right posterior and lateral banded 05/19/2016      Past Surgical History:  Past Surgical History:  Procedure Laterality Date  . ABDOMINAL HYSTERECTOMY  01-19-2001  DR LOMAX   W/ LEFT SALPINGO-OOPHORECTOMY AND EXCISION ENDOMETRIOSIS  . BREAST BIOPSY Left 09/22/2017   affirm bx, path pending  . CESAREAN SECTION    . COLONOSCOPY    . HEMORRHOID BANDING  2018  . REMOVAL OF SIDE PLATE, DISTAL FIBULA, RIGHT ANKLE  05-08-2004  . UPPER GASTROINTESTINAL ENDOSCOPY      Gynecologic History: No LMP recorded. Patient has had a hysterectomy.  Obstetric History: G1P1001  Family History:  Family History  Problem Relation Age of Onset  . Ovarian cancer Mother   . Uterine cancer Mother   . Breast cancer Mother 91  . Diabetes Father   . Kidney disease Paternal Grandmother   . Breast cancer Maternal Aunt 60  . Esophageal cancer Neg Hx   . Colon cancer Neg Hx   . Stomach cancer Neg Hx   . Pancreatic cancer Neg Hx   . Liver disease Neg Hx   . Rectal cancer Neg Hx     Social History:  Social History   Socioeconomic History  . Marital status: Married    Spouse name: Not on file  . Number of children: 1  . Years of education: BS  . Highest education level: Not on file  Occupational History  . 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 Smoking status: Never Smoker  . Smokeless tobacco: Never Used  Vaping Use  . Vaping Use: Never used  Substance and Sexual Activity  . Alcohol use: No  . Drug use: No  . Sexual activity: Yes  Other Topics Concern  . 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 Sulfa Antibiotics Hives and Itching    Medications: Prior to Admission medications   Medication Sig Start Date End Date Taking? Authorizing Provider  albuterol (VENTOLIN HFA) 108 (90 Base) MCG/ACT inhaler SMARTSIG:2 Puff(s) By Mouth Every 4-6 Hours PRN 11/29/19  Yes [provider]  famotidine (PEPCID) 40 MG tablet famotidine 40 mg tablet  TK 1 T PO BID   Yes [provider]  lisinopril (ZESTRIL) 5 MG tablet Take 5 mg by mouth daily. 12/29/19  Yes [provider]  SUMAtriptan (IMITREX) 50 MG tablet Take 50 mg by mouth every 2 (two) hours as needed for migraine. May repeat in 2 hours if headache persists or recurs.   Yes [provider]  traMADol (ULTRAM) 50 MG tablet Take 1 tablet (50 mg total) by mouth every 6 (six) hours as needed. Patient not taking: Reported on 03/08/2020 09/05/19 09/04/20  Lavonia Drafts, MD    Physical Exam Vitals: There were no vitals filed for this visit. No LMP recorded. Patient has had a hysterectomy.  General: NAD HEENT: normocephalic, anicteric Pulmonary: No increased work of breathing Genitourinary:  External: Normal external female genitalia.  Normal urethral meatus, normal  Bartholin's and Skene's glands.    Vagina: Normal vaginal mucosa, no evidence of prolapse.    Cervix: surgically absent  Uterus: surgically absent  Adnexa: ovaries non-enlarged, no adnexal masses  Rectal: deferred  Lymphatic: no evidence of inguinal lymphadenopathy Extremities: no edema, erythema, or tenderness Neurologic: Grossly intact Psychiatric: mood appropriate, affect full  PVR: N?A  Stress test:  negative supine   Female chaperone present for pelvic portions of the physical exam  Assessment: 50 y.o. G1P1001 presenting for evaluation of mixed incontinence  Plan: Problem List Items Addressed This Visit   None   Visit Diagnoses    Mixed stress and urge incontinence    -  Primary    Relevant Orders   Ambulatory referral to Physical Therapy   Right upper quadrant pain       Relevant Orders   Ambulatory referral to Gastroenterology     1) Mixed incontinence  - Pelvic PT to work on stress incontinence.  Based on response we discussed other treatment modalities including TVT - Urge incontinence discussed timed voids and bladder retraining.  Based on response antispasmotics are a consideration.  Some cased of mixed incontinence due to UVJ reflux may improve with TVT.  Interstim is another treatment modality for refractory urge incontinence. - No evidence of concomitant prolapse on exam today - avoid caffinated beverages  2) RUQ pain - ER visit in July 2021, has continued.  CT scan negative in July.  Requests GI referral  3) A total of  25 minutes were spent in face-to-face contact with the patient during this encounter with over half of that time devoted to counseling and coordination of care.  4) Return in about 3 months (around 06/06/2020) for follow up exam.   Malachy Mood, MD, [place], [place] 03/08/2020, 10:10 AM

## 2020-03-26 ENCOUNTER — Other Ambulatory Visit: Payer: Self-pay | Admitting: Obstetrics and Gynecology

## 2020-03-26 DIAGNOSIS — Z1231 Encounter for screening mammogram for malignant neoplasm of breast: Secondary | ICD-10-CM

## 2020-04-24 DIAGNOSIS — Z1231 Encounter for screening mammogram for malignant neoplasm of breast: Secondary | ICD-10-CM

## 2020-05-07 ENCOUNTER — Other Ambulatory Visit: Payer: Self-pay

## 2020-05-07 ENCOUNTER — Ambulatory Visit: Payer: BC Managed Care – PPO | Admitting: Gastroenterology

## 2020-05-07 ENCOUNTER — Encounter: Payer: Self-pay | Admitting: Gastroenterology

## 2020-05-07 VITALS — BP 106/65 | HR 60 | Ht 66.0 in | Wt 228.4 lb

## 2020-05-07 DIAGNOSIS — G8929 Other chronic pain: Secondary | ICD-10-CM

## 2020-05-07 DIAGNOSIS — R1011 Right upper quadrant pain: Secondary | ICD-10-CM | POA: Diagnosis not present

## 2020-05-07 MED ORDER — CLENPIQ 10-3.5-12 MG-GM -GM/160ML PO SOLN: 320.0000 mL | ORAL | 0 refills | Status: DC

## 2020-05-07 NOTE — Progress Notes (Signed)
Primary Care Physician: Lavonne Chick, NP (Inactive)  Primary Gastroenterologist:  Dr. Lucilla Lame  Chief Complaint  Patient presents with  . New Patient (Initial Visit)    RUQ abdominal pain    HPI: Nancy Freeman is a 50 y.o. female here with a history of right upper quadrant pain.  The patient has seen Dr. Vicente Males for this in the past.  The patient reports that the right sided abdominal pain is not made any better with eating drinking or moving her bowels. She has tried diet changing and does not relate the symptoms to be associated with anything in particular.  The patient has seen Dr. Carlean Purl in the past also.  She has had treatment of her internal hemorrhoids. She had been requested to stop artificial sweeteners and she did not notice any difference with that.  Her pain is intermittent and she states that her to most recent episodes were on New Year's and then again on February 27.  She denies any unexplained weight loss associated with the abdominal pain.  She also denies any black stools or bloody stools.  She does report some pelvic floor dysfunction and has had issues with urine and stool incontinence.  Past Medical History:  Diagnosis Date  . Allergy   . Anxiety   . BRCA negative 08/2019   MyRisk neg  . Family history of breast cancer 08/2019   IBIS=14.3%/riskscore=14.9%  . Family history of ovarian cancer   . Headache   . Hidradenoma VULVAR  . IBS (irritable bowel syndrome)   . Internal hemorrhoid     Current Outpatient Medications  Medication Sig Dispense Refill  . famotidine (PEPCID) 40 MG tablet famotidine 40 mg tablet  TK 1 T PO BID    . lisinopril (ZESTRIL) 10 MG tablet Take 10 mg by mouth daily.    . nitroGLYCERIN (NITROSTAT) 0.4 MG SL tablet SMARTSIG:1 Tablet(s) Sublingual PRN    . SUMAtriptan (IMITREX) 50 MG tablet Take 50 mg by mouth every 2 (two) hours as needed for migraine. May repeat in 2 hours if headache persists or recurs.     No current  facility-administered medications for this visit.    Allergies as of 05/07/2020 - Review Complete 05/07/2020  Allergen Reaction Noted  . Sulfa antibiotics Hives and Itching 12/23/2015    ROS:  General: Negative for anorexia, weight loss, fever, chills, fatigue, weakness. ENT: Negative for hoarseness, difficulty swallowing , nasal congestion. CV: Negative for chest pain, angina, palpitations, dyspnea on exertion, peripheral edema.  Respiratory: Negative for dyspnea at rest, dyspnea on exertion, cough, sputum, wheezing.  GI: See history of present illness. GU:  Negative for dysuria, hematuria, urinary incontinence, urinary frequency, nocturnal urination.  Endo: Negative for unusual weight change.    Physical Examination:   BP 106/65   Pulse 60   Ht 5' 6"  (1.676 m)   Wt 228 lb 6.4 oz (103.6 kg)   BMI 36.86 kg/m   General: Well-nourished, well-developed in no acute distress.  Eyes: No icterus. Conjunctivae pink. Lungs: Clear to auscultation bilaterally. Non-labored. Heart: Regular rate and rhythm, no murmurs rubs or gallops.  Abdomen: Bowel sounds are normal, Positive tenderness to one finger palpation while flexing the abdominal wall muscles and even pain in the right upper quadrant just by flexing without palpation, nondistended, no hepatosplenomegaly or masses, no abdominal bruits or hernia , no rebound or guarding.  Positive carnet sign Extremities: No lower extremity edema. No clubbing or deformities. Neuro: Alert and oriented x  3.  Grossly intact. Skin: Warm and dry, no jaundice.   Psych: Alert and cooperative, normal mood and affect.  Labs:    Imaging Studies: No results found.  Assessment and Plan:   Nancy Freeman is a 50 y.o. y/o female who comes in today with right-sided abdominal pain that has been chronic with a negative workup including a right upper quadrant ultrasound and HIDA scan.  The patient on physical exam clearly has musculoskeletal pain with  reproducing of the pain with flexion of the abdominal wall muscles by both halfway sitting up and also by doing a straight leg test with both knees Straight while lifting both legs above the exam table. The patient has been reassured that her symptoms are consistent with intestinal pain and she has been told to use anti-inflammatory medications in addition to warm compresses and massaging the area when she has the symptoms.  The patient has been explained the plan and agrees with it.     Lucilla Lame, MD. Mar[facility] Regal    Note: This dictation was prepared with Dragon dictation along with smaller phrase technology. Any transcriptional errors that result from this process are unintentional.

## 2020-05-08 ENCOUNTER — Encounter: Payer: Self-pay | Admitting: Gastroenterology

## 2020-05-08 ENCOUNTER — Other Ambulatory Visit: Payer: Self-pay

## 2020-05-08 ENCOUNTER — Other Ambulatory Visit
Admission: RE | Admit: 2020-05-08 | Discharge: 2020-05-08 | Disposition: A | Discharge status: Home / Self Care | Payer: BC Managed Care – PPO | Source: Ambulatory Visit | Attending: Gastroenterology | Admitting: Gastroenterology

## 2020-05-08 DIAGNOSIS — Z20822 Contact with and (suspected) exposure to covid-19: Secondary | ICD-10-CM | POA: Insufficient documentation

## 2020-05-08 DIAGNOSIS — Z1211 Encounter for screening for malignant neoplasm of colon: Secondary | ICD-10-CM | POA: Diagnosis not present

## 2020-05-08 DIAGNOSIS — Z833 Family history of diabetes mellitus: Secondary | ICD-10-CM | POA: Diagnosis not present

## 2020-05-08 DIAGNOSIS — Z01812 Encounter for preprocedural laboratory examination: Secondary | ICD-10-CM | POA: Insufficient documentation

## 2020-05-08 DIAGNOSIS — Z8041 Family history of malignant neoplasm of ovary: Secondary | ICD-10-CM | POA: Diagnosis not present

## 2020-05-08 DIAGNOSIS — K449 Diaphragmatic hernia without obstruction or gangrene: Secondary | ICD-10-CM | POA: Diagnosis not present

## 2020-05-08 DIAGNOSIS — Z841 Family history of disorders of kidney and ureter: Secondary | ICD-10-CM | POA: Diagnosis not present

## 2020-05-08 DIAGNOSIS — G8929 Other chronic pain: Secondary | ICD-10-CM

## 2020-05-08 DIAGNOSIS — Z8049 Family history of malignant neoplasm of other genital organs: Secondary | ICD-10-CM | POA: Diagnosis not present

## 2020-05-08 DIAGNOSIS — K635 Polyp of colon: Secondary | ICD-10-CM | POA: Diagnosis not present

## 2020-05-08 DIAGNOSIS — K640 First degree hemorrhoids: Secondary | ICD-10-CM | POA: Diagnosis not present

## 2020-05-08 DIAGNOSIS — K2100 Gastro-esophageal reflux disease with esophagitis, without bleeding: Secondary | ICD-10-CM

## 2020-05-08 DIAGNOSIS — Z882 Allergy status to sulfonamides status: Secondary | ICD-10-CM | POA: Diagnosis not present

## 2020-05-08 DIAGNOSIS — Z79899 Other long term (current) drug therapy: Secondary | ICD-10-CM | POA: Diagnosis not present

## 2020-05-08 DIAGNOSIS — Z803 Family history of malignant neoplasm of breast: Secondary | ICD-10-CM | POA: Diagnosis not present

## 2020-05-08 LAB — SARS CORONAVIRUS 2 (TAT 6-24 HRS): SARS Coronavirus 2: NEGATIVE

## 2020-05-10 ENCOUNTER — Ambulatory Visit: Payer: BC Managed Care – PPO | Admitting: Anesthesiology

## 2020-05-10 ENCOUNTER — Encounter
Admission: RE | Disposition: A | Discharge status: Home / Self Care | Payer: Self-pay | Source: Home / Self Care | Attending: Gastroenterology

## 2020-05-10 ENCOUNTER — Other Ambulatory Visit: Payer: Self-pay

## 2020-05-10 ENCOUNTER — Ambulatory Visit
Admission: RE | Admit: 2020-05-10 | Discharge: 2020-05-10 | Disposition: A | Discharge status: Home / Self Care | Payer: BC Managed Care – PPO | Attending: Gastroenterology | Admitting: Gastroenterology

## 2020-05-10 ENCOUNTER — Encounter: Payer: Self-pay | Admitting: Gastroenterology

## 2020-05-10 DIAGNOSIS — K219 Gastro-esophageal reflux disease without esophagitis: Secondary | ICD-10-CM

## 2020-05-10 DIAGNOSIS — Z860100 Personal history of colon polyps, unspecified: Secondary | ICD-10-CM

## 2020-05-10 DIAGNOSIS — K449 Diaphragmatic hernia without obstruction or gangrene: Secondary | ICD-10-CM | POA: Insufficient documentation

## 2020-05-10 DIAGNOSIS — K635 Polyp of colon: Secondary | ICD-10-CM

## 2020-05-10 DIAGNOSIS — Z79899 Other long term (current) drug therapy: Secondary | ICD-10-CM | POA: Insufficient documentation

## 2020-05-10 DIAGNOSIS — Z1211 Encounter for screening for malignant neoplasm of colon: Secondary | ICD-10-CM | POA: Insufficient documentation

## 2020-05-10 DIAGNOSIS — Z803 Family history of malignant neoplasm of breast: Secondary | ICD-10-CM | POA: Insufficient documentation

## 2020-05-10 DIAGNOSIS — Z882 Allergy status to sulfonamides status: Secondary | ICD-10-CM | POA: Insufficient documentation

## 2020-05-10 DIAGNOSIS — Z8041 Family history of malignant neoplasm of ovary: Secondary | ICD-10-CM | POA: Insufficient documentation

## 2020-05-10 DIAGNOSIS — Z841 Family history of disorders of kidney and ureter: Secondary | ICD-10-CM | POA: Insufficient documentation

## 2020-05-10 DIAGNOSIS — K640 First degree hemorrhoids: Secondary | ICD-10-CM | POA: Insufficient documentation

## 2020-05-10 DIAGNOSIS — Z8049 Family history of malignant neoplasm of other genital organs: Secondary | ICD-10-CM | POA: Insufficient documentation

## 2020-05-10 DIAGNOSIS — Z20822 Contact with and (suspected) exposure to covid-19: Secondary | ICD-10-CM | POA: Insufficient documentation

## 2020-05-10 DIAGNOSIS — Z833 Family history of diabetes mellitus: Secondary | ICD-10-CM | POA: Insufficient documentation

## 2020-05-10 HISTORY — PX: COLONOSCOPY WITH PROPOFOL: SHX5780

## 2020-05-10 HISTORY — DX: Gastro-esophageal reflux disease without esophagitis: K21.9

## 2020-05-10 HISTORY — DX: Angina pectoris, unspecified: I20.9

## 2020-05-10 HISTORY — PX: POLYPECTOMY: SHX5525

## 2020-05-10 HISTORY — DX: Essential (primary) hypertension: I10

## 2020-05-10 HISTORY — PX: ESOPHAGOGASTRODUODENOSCOPY (EGD) WITH PROPOFOL: SHX5813

## 2020-05-10 SURGERY — COLONOSCOPY WITH PROPOFOL
Anesthesia: General | Site: Throat

## 2020-05-10 MED ORDER — STERILE WATER FOR IRRIGATION IR SOLN
Status: DC | PRN
  Administered 2020-05-10: 75 mL

## 2020-05-10 MED ORDER — GLYCOPYRROLATE 0.2 MG/ML IJ SOLN
INTRAMUSCULAR | Status: DC | PRN
  Administered 2020-05-10: .1 mg via INTRAVENOUS

## 2020-05-10 MED ORDER — LACTATED RINGERS IV SOLN: INTRAVENOUS | Status: DC

## 2020-05-10 MED ORDER — ONDANSETRON HCL 4 MG/2ML IJ SOLN
INTRAMUSCULAR | Status: DC | PRN
  Administered 2020-05-10: 4 mg via INTRAVENOUS

## 2020-05-10 MED ORDER — PROPOFOL 10 MG/ML IV BOLUS
INTRAVENOUS | Status: DC | PRN
  Administered 2020-05-10: 80 mg via INTRAVENOUS
  Administered 2020-05-10 (×2): 30 mg via INTRAVENOUS
  Administered 2020-05-10: 40 mg via INTRAVENOUS
  Administered 2020-05-10 (×5): 30 mg via INTRAVENOUS

## 2020-05-10 MED ORDER — LIDOCAINE HCL (CARDIAC) PF 100 MG/5ML IV SOSY
PREFILLED_SYRINGE | INTRAVENOUS | Status: DC | PRN
  Administered 2020-05-10: 60 mg via INTRAVENOUS

## 2020-05-10 SURGICAL SUPPLY — 38 items
BALLN DILATOR 10-12 8 (BALLOONS)
BALLN DILATOR 12-15 8 (BALLOONS)
BALLN DILATOR 15-18 8 (BALLOONS)
BALLN DILATOR CRE 0-12 8 (BALLOONS)
BALLN DILATOR ESOPH 8 10 CRE (MISCELLANEOUS) IMPLANT
BALLOON DILATOR 12-15 8 (BALLOONS) IMPLANT
BALLOON DILATOR 15-18 8 (BALLOONS) IMPLANT
BALLOON DILATOR CRE 0-12 8 (BALLOONS) IMPLANT
BLOCK BITE 60FR ADLT L/F GRN (MISCELLANEOUS) ×3 IMPLANT
CLIP HMST 235XBRD CATH ROT (MISCELLANEOUS) IMPLANT
CLIP RESOLUTION 360 11X235 (MISCELLANEOUS)
ELECT REM PT RETURN 9FT ADLT (ELECTROSURGICAL)
ELECTRODE REM PT RTRN 9FT ADLT (ELECTROSURGICAL) IMPLANT
FCP ESCP3.2XJMB 240X2.8X (MISCELLANEOUS)
FORCEPS BIOP RAD 4 LRG CAP 4 (CUTTING FORCEPS) IMPLANT
FORCEPS BIOP RJ4 240 W/NDL (MISCELLANEOUS)
FORCEPS ESCP3.2XJMB 240X2.8X (MISCELLANEOUS) IMPLANT
GOWN CVR UNV OPN BCK APRN NK (MISCELLANEOUS) ×4 IMPLANT
GOWN ISOL THUMB LOOP REG UNIV (MISCELLANEOUS) ×6
INJECTOR VARIJECT VIN23 (MISCELLANEOUS) IMPLANT
KIT DEFENDO VALVE AND CONN (KITS) IMPLANT
KIT PRC NS LF DISP ENDO (KITS) ×2 IMPLANT
KIT PROCEDURE OLYMPUS (KITS) ×3
MANIFOLD NEPTUNE II (INSTRUMENTS) ×3 IMPLANT
MARKER SPOT ENDO TATTOO 5ML (MISCELLANEOUS) IMPLANT
PROBE APC STR FIRE (PROBE) IMPLANT
RETRIEVER NET PLAT FOOD (MISCELLANEOUS) IMPLANT
RETRIEVER NET ROTH 2.5X230 LF (MISCELLANEOUS) IMPLANT
SNARE COLD EXACTO (MISCELLANEOUS) ×3 IMPLANT
SNARE SHORT THROW 13M SML OVAL (MISCELLANEOUS) IMPLANT
SNARE SHORT THROW 30M LRG OVAL (MISCELLANEOUS) IMPLANT
SNARE SNG USE RND 15MM (INSTRUMENTS) IMPLANT
SPOT EX ENDOSCOPIC TATTOO (MISCELLANEOUS)
SYR INFLATION 60ML (SYRINGE) IMPLANT
TRAP ETRAP POLY (MISCELLANEOUS) ×3 IMPLANT
VARIJECT INJECTOR VIN23 (MISCELLANEOUS)
WATER STERILE IRR 250ML POUR (IV SOLUTION) ×3 IMPLANT
WIRE CRE 18-20MM 8CM F G (MISCELLANEOUS) IMPLANT

## 2020-05-10 NOTE — Anesthesia Postprocedure Evaluation (Signed)
Anesthesia Post Note  Patient: [name]  Procedure(s) Performed: COLONOSCOPY WITH PROPOFOL (N/A Rectum) ESOPHAGOGASTRODUODENOSCOPY (EGD) WITH PROPOFOL (N/A Throat) POLYPECTOMY (N/A Rectum)     Patient location during evaluation: PACU Anesthesia Type: General Level of consciousness: awake and alert Pain management: pain level controlled Vital Signs Assessment: post-procedure vital signs reviewed and stable Respiratory status: spontaneous breathing, nonlabored ventilation, respiratory function stable and patient connected to nasal cannula oxygen Cardiovascular status: blood pressure returned to baseline and stable Postop Assessment: no apparent nausea or vomiting Anesthetic complications: no   No complications documented.  Alisa Graff

## 2020-05-10 NOTE — Transfer of Care (Signed)
Immediate Anesthesia Transfer of Care Note  Patient: [name]  Procedure(s) Performed: COLONOSCOPY WITH PROPOFOL (N/A Rectum) ESOPHAGOGASTRODUODENOSCOPY (EGD) WITH PROPOFOL (N/A Throat) POLYPECTOMY (N/A Rectum)  Patient Location: PACU  Anesthesia Type: General  Level of Consciousness: awake, alert  and patient cooperative  Airway and Oxygen Therapy: Patient Spontanous Breathing and Patient connected to supplemental oxygen  Post-op Assessment: Post-op Vital signs reviewed, Patient's Cardiovascular Status Stable, Respiratory Function Stable, Patent Airway and No signs of Nausea or vomiting  Post-op Vital Signs: Reviewed and stable  Complications: No complications documented.

## 2020-05-10 NOTE — Op Note (Signed)
[facility]astroenter[facility] Patient Name: Nancy Freeman Procedure Date: 05/10/2020 11:51 AM MRN: [id number] Account #: [id number] Date of Birth: [birth year] Admit Type: Outpatient Age: 50 Room: [facility] OR ROOM 01 Gender: Female Note Status: Finalized Procedure:             Upper GI endoscopy Indications:           Suspected Barrett's esophagus Providers:             Lucilla Lame MD, MD Medicines:             Propofol per Anesthesia Complications:         No immediate complications. Procedure:             Pre-Anesthesia Assessment:                        - Prior to the procedure, a History and Physical was                         performed, and patient medications and allergies were                         reviewed. The patient's tolerance of previous                         anesthesia was also reviewed. The risks and benefits                         of the procedure and the sedation options and risks                         were discussed with the patient. All questions were                         answered, and informed consent was obtained. Prior                         Anticoagulants: The patient has taken no previous                         anticoagulant or antiplatelet agents. ASA Grade                         Assessment: II - A patient with mild systemic disease.                         After reviewing the risks and benefits, the patient                         was deemed in satisfactory condition to undergo the                         procedure.                        After obtaining informed consent, the endoscope was                         passed under direct vision. Throughout the procedure,  the patient's blood pressure, pulse, and oxygen                         saturations were monitored continuously. The was                         introduced through the mouth, and advanced to the                         second part of duodenum. The  upper GI endoscopy was                         accomplished without difficulty. The patient tolerated                         the procedure well. Findings:      A small hiatal hernia was present.      The stomach was normal.      The examined duodenum was normal. Impression:            - Small hiatal hernia.                        - Normal stomach.                        - Normal examined duodenum.                        - No specimens collected.                        - No sign of Barrett's esophagus seen. Recommendation:        - Discharge patient to home.                        - Resume previous diet.                        - Continue present medications.                        - Perform a colonoscopy today. Procedure Code(s):     --- Professional ---                        [phone], Esophagogastroduodenoscopy, flexible,                         transoral; diagnostic, including collection of                         specimen(s) by brushing or washing, when performed                         (separate procedure) Diagnosis Code(s):     --- Professional ---                        K44.9, Diaphragmatic hernia without obstruction or                         gangrene CPT copyright 2019 American  Medical Association. All rights reserved. The codes documented in this report are preliminary and upon coder review may  be revised to meet current compliance requirements. Lucilla Lame MD, MD 05/10/2020 12:05:01 PM This report has been signed electronically. Number of Addenda: 0 Note Initiated On: 05/10/2020 11:51 AM Total Procedure Duration: 0 hours 3 minutes 35 seconds  Estimated Blood Loss:  Estimated blood loss: none.      [facility]

## 2020-05-10 NOTE — Op Note (Signed)
[facility] Gastroenterology Patient Name: Nancy Freeman Procedure Date: 05/10/2020 11:47 AM MRN: [id number] Account #: [id number] Date of Birth: 1970/11/13 Admit Type: Outpatient Age: 50 Room: [facility] OR ROOM 01 Gender: Female Note Status: Finalized Procedure:             Colonoscopy Indications:           High risk colon cancer surveillance: Personal history                         of colonic polyps Providers:             Lucilla Lame MD, MD Medicines:             Propofol per Anesthesia Complications:         No immediate complications. Procedure:             Pre-Anesthesia Assessment:                        - Prior to the procedure, a History and Physical was                         performed, and patient medications and allergies were                         reviewed. The patient's tolerance of previous                         anesthesia was also reviewed. The risks and benefits                         of the procedure and the sedation options and risks                         were discussed with the patient. All questions were                         answered, and informed consent was obtained. Prior                         Anticoagulants: The patient has taken no previous                         anticoagulant or antiplatelet agents. ASA Grade                         Assessment: II - A patient with mild systemic disease.                         After reviewing the risks and benefits, the patient                         was deemed in satisfactory condition to undergo the                         procedure.                        After obtaining informed consent, the colonoscope was  passed under direct vision. Throughout the procedure,                         the patient's blood pressure, pulse, and oxygen                         saturations were monitored continuously. The was                         introduced through the anus and advanced  to the the                         cecum, identified by appendiceal orifice and ileocecal                         valve. The colonoscopy was performed without                         difficulty. The patient tolerated the procedure well.                         The quality of the bowel preparation was good. Findings:      The perianal and digital rectal examinations were normal.      A 4 mm polyp was found in the sigmoid colon. The polyp was sessile. The       polyp was removed with a cold snare. Resection and retrieval were       complete.      Non-bleeding internal hemorrhoids were found during retroflexion. The       hemorrhoids were Grade I (internal hemorrhoids that do not prolapse). Impression:            - One 4 mm polyp in the sigmoid colon, removed with a                         cold snare. Resected and retrieved.                        - Non-bleeding internal hemorrhoids. Recommendation:        - Discharge patient to home.                        - Resume previous diet.                        - Continue present medications.                        - Await pathology results.                        - Repeat colonoscopy in 7 years if polyp adenoma and                         10 years if hyperplastic Procedure Code(s):     --- Professional ---                        [phone], Colonoscopy, flexible; with removal of  tumor(s), polyp(s), or other lesion(s) by snare                         technique Diagnosis Code(s):     --- Professional ---                        Z86.010, Personal history of colonic polyps                        K63.5, Polyp of colon CPT copyright 2019 American Medical Association. All rights reserved. The codes documented in this report are preliminary and upon coder review may  be revised to meet current compliance requirements. Lucilla Lame MD, MD 05/10/2020 12:25:25 PM This report has been signed electronically. Number of Addenda: 0 Note  Initiated On: 05/10/2020 11:47 AM Scope Withdrawal Time: 0 hours 9 minutes 0 seconds  Total Procedure Duration: 0 hours 16 minutes 9 seconds  Estimated Blood Loss:  Estimated blood loss: none.      [facility]

## 2020-05-10 NOTE — Anesthesia Preprocedure Evaluation (Signed)
Anesthesia Evaluation  Patient identified by MRN, date of birth, ID band Patient awake    Reviewed: Allergy & Precautions, H&P , NPO status , Patient's Chart, lab work & pertinent test results, reviewed documented beta blocker date and time   Airway Mallampati: II  TM Distance: >3 FB Neck ROM: full    Dental no notable dental hx.    Pulmonary neg pulmonary ROS,    Pulmonary exam normal breath sounds clear to auscultation       Cardiovascular Exercise Tolerance: Good hypertension, + angina (normal TTE and myocardial perfusion scan 04/24/20)  Rhythm:regular Rate:Normal     Neuro/Psych  Headaches, Anxiety    GI/Hepatic Neg liver ROS, GERD  ,IBS   Endo/Other  negative endocrine ROS  Renal/GU negative Renal ROS  negative genitourinary   Musculoskeletal   Abdominal   Peds  Hematology negative hematology ROS (+)   Anesthesia Other Findings   Reproductive/Obstetrics negative OB ROS                             Anesthesia Physical Anesthesia Plan  ASA: II  Anesthesia Plan: General   Post-op Pain Management:    Induction:   PONV Risk Score and Plan: 3 and Propofol infusion, TIVA and Treatment may vary due to age or medical condition  Airway Management Planned:   Additional Equipment:   Intra-op Plan:   Post-operative Plan:   Informed Consent: I have reviewed the patients History and Physical, chart, labs and discussed the procedure including the risks, benefits and alternatives for the proposed anesthesia with the patient or authorized representative who has indicated his/her understanding and acceptance.     Dental Advisory Given  Plan Discussed with: CRNA  Anesthesia Plan Comments:         Anesthesia Quick Evaluation

## 2020-05-10 NOTE — Progress Notes (Signed)
No risk at this time. 

## 2020-05-10 NOTE — Anesthesia Procedure Notes (Signed)
Procedure Name: General with mask airway Date/Time: 05/10/2020 11:54 AM Performed by: Dionne Bucy, CRNA Pre-anesthesia Checklist: Patient identified, Emergency Drugs available, Suction available, Patient being monitored and Timeout performed Patient Re-evaluated:Patient Re-evaluated prior to induction Oxygen Delivery Method: Nasal cannula Placement Confirmation: positive ETCO2

## 2020-05-10 NOTE — H&P (Signed)
Lucilla Lame, MD [address] [place], [place] [address] Phone:[phone] Fax : [phone]  Primary Care Physician:  Rutherford Limerick, PA Primary Gastroenterologist:  Dr. Allen Norris  Pre-Procedure History & Physical: HPI:  Nancy Freeman is a 50 y.o. female is here for an endoscopy and colonoscopy.   Past Medical History:  Diagnosis Date  . Allergy   . Anginal pain ([place])    Normal TTE and myocardial perfusion scan 04/24/20  . Anxiety   . BRCA negative 08/2019   MyRisk neg  . Family history of breast cancer 08/2019   IBIS=14.3%/riskscore=14.9%  . Family history of ovarian cancer   . GERD (gastroesophageal reflux disease)   . Headache    migraines  1-2x/month  . Hidradenoma VULVAR  . Hypertension   . IBS (irritable bowel syndrome)   . Internal hemorrhoid     Past Surgical History:  Procedure Laterality Date  . ABDOMINAL HYSTERECTOMY  01-19-2001  DR LOMAX   W/ LEFT SALPINGO-OOPHORECTOMY AND EXCISION ENDOMETRIOSIS  . BREAST BIOPSY Left 09/22/2017   affirm bx, path pending  . CESAREAN SECTION    . COLONOSCOPY    . HEMORRHOID BANDING  2018  . REMOVAL OF SIDE PLATE, DISTAL FIBULA, RIGHT ANKLE  05-08-2004  . UPPER GASTROINTESTINAL ENDOSCOPY      Prior to Admission medications   Medication Sig Start Date End Date Taking? Authorizing Provider  famotidine (PEPCID) 40 MG tablet famotidine 40 mg tablet  TK 1 T PO BID   Yes [provider]  lisinopril (ZESTRIL) 10 MG tablet Take 10 mg by mouth daily. 04/01/20  Yes [provider]  melatonin 3 MG TABS tablet Take 3 mg by mouth at bedtime.   Yes [provider]  nitroGLYCERIN (NITROSTAT) 0.4 MG SL tablet SMARTSIG:1 Tablet(s) Sublingual PRN 04/18/20  Yes [provider]  SUMAtriptan (IMITREX) 50 MG tablet Take 50 mg by mouth every 2 (two) hours as needed for migraine. May repeat in 2 hours if headache persists or recurs.   Yes [provider]  Sod Picosulfate-Mag Ox-Cit  Acd (CLENPIQ) 10-3.5-12 MG-GM -GM/160ML SOLN Take 320 mLs by mouth as directed. 05/07/20   Lucilla Lame, MD    Allergies as of 05/08/2020 - Review Complete 05/08/2020  Allergen Reaction Noted  . Sulfa antibiotics Hives and Itching 12/23/2015    Family History  Problem Relation Age of Onset  . Ovarian cancer Mother   . Uterine cancer Mother   . Breast cancer Mother 66  . Diabetes Father   . Kidney disease Paternal Grandmother   . Breast cancer Maternal Aunt 60  . Esophageal cancer Neg Hx   . Colon cancer Neg Hx   . Stomach cancer Neg Hx   . Pancreatic cancer Neg Hx   . Liver disease Neg Hx   . Rectal cancer Neg Hx     Social History   Socioeconomic History  . Marital status: Married    Spouse name: Not on file  . Number of children: 1  . Years of education: BS  . Highest education level: Not on file  Occupational History  . 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 Smoking status: Never Smoker  . Smokeless tobacco: Never Used  Vaping Use  . Vaping Use: Never used  Substance and Sexual Activity  . Alcohol use: No  . Drug use: No  . Sexual activity: Yes  Other Topics Concern  . 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 Neck:  Supple; no masses or thyromegaly. Lungs:  Clear  throughout to auscultation.    Heart:  Regular rate and rhythm. Abdomen:  Soft, nontender and nondistended. Normal bowel sounds, without guarding, and without rebound.   Neurologic:  Alert and  oriented x4;  grossly normal neurologically.  Impression/Plan: Nancy Freeman is here for an endoscopy and colonoscopy to be performed for barrett's and history of colon polyps with last colonoscopy 12/2015  Risks, benefits, limitations, and alternatives regarding  endoscopy and colonoscopy have been reviewed with the patient.  Questions have been answered.  All parties agreeable.   Lucilla Lame, MD  05/10/2020, 11:22 AM

## 2020-05-13 ENCOUNTER — Encounter: Payer: Self-pay | Admitting: Gastroenterology

## 2020-05-14 LAB — SURGICAL PATHOLOGY

## 2020-05-15 ENCOUNTER — Encounter: Payer: Self-pay | Admitting: Gastroenterology

## 2020-06-11 ENCOUNTER — Ambulatory Visit
Admission: RE | Admit: 2020-06-11 | Discharge: 2020-06-11 | Disposition: A | Discharge status: Home / Self Care | Payer: BC Managed Care – PPO | Source: Ambulatory Visit | Attending: Obstetrics and Gynecology | Admitting: Obstetrics and Gynecology

## 2020-06-11 ENCOUNTER — Other Ambulatory Visit: Payer: Self-pay

## 2020-06-11 DIAGNOSIS — Z1231 Encounter for screening mammogram for malignant neoplasm of breast: Secondary | ICD-10-CM

## 2020-06-12 ENCOUNTER — Other Ambulatory Visit: Payer: Self-pay | Admitting: Obstetrics and Gynecology

## 2020-06-12 DIAGNOSIS — R928 Other abnormal and inconclusive findings on diagnostic imaging of breast: Secondary | ICD-10-CM

## 2020-06-17 ENCOUNTER — Ambulatory Visit: Payer: BC Managed Care – PPO | Admitting: Obstetrics and Gynecology

## 2020-07-03 ENCOUNTER — Ambulatory Visit
Admission: RE | Admit: 2020-07-03 | Discharge: 2020-07-03 | Disposition: A | Discharge status: Home / Self Care | Payer: BC Managed Care – PPO | Source: Ambulatory Visit | Attending: Obstetrics and Gynecology | Admitting: Obstetrics and Gynecology

## 2020-07-03 ENCOUNTER — Other Ambulatory Visit: Payer: Self-pay

## 2020-07-03 DIAGNOSIS — R928 Other abnormal and inconclusive findings on diagnostic imaging of breast: Secondary | ICD-10-CM

## 2021-05-21 ENCOUNTER — Other Ambulatory Visit: Payer: Self-pay

## 2021-05-21 DIAGNOSIS — Z1231 Encounter for screening mammogram for malignant neoplasm of breast: Secondary | ICD-10-CM

## 2021-05-30 DIAGNOSIS — Z1231 Encounter for screening mammogram for malignant neoplasm of breast: Secondary | ICD-10-CM

## 2021-06-13 ENCOUNTER — Ambulatory Visit
Admission: RE | Admit: 2021-06-13 | Discharge: 2021-06-13 | Disposition: A | Discharge status: Home / Self Care | Payer: BC Managed Care – PPO | Source: Ambulatory Visit

## 2021-06-13 DIAGNOSIS — Z1231 Encounter for screening mammogram for malignant neoplasm of breast: Secondary | ICD-10-CM

## 2021-07-30 ENCOUNTER — Ambulatory Visit (INDEPENDENT_AMBULATORY_CARE_PROVIDER_SITE_OTHER): Payer: BC Managed Care – PPO

## 2021-07-30 ENCOUNTER — Encounter: Payer: Self-pay | Admitting: Podiatry

## 2021-07-30 ENCOUNTER — Ambulatory Visit (INDEPENDENT_AMBULATORY_CARE_PROVIDER_SITE_OTHER): Payer: BC Managed Care – PPO | Admitting: Podiatry

## 2021-07-30 DIAGNOSIS — M7672 Peroneal tendinitis, left leg: Secondary | ICD-10-CM | POA: Diagnosis not present

## 2021-07-30 DIAGNOSIS — M7750 Other enthesopathy of unspecified foot: Secondary | ICD-10-CM | POA: Diagnosis not present

## 2021-07-30 DIAGNOSIS — M7752 Other enthesopathy of left foot: Secondary | ICD-10-CM | POA: Diagnosis not present

## 2021-07-30 MED ORDER — DICLOFENAC SODIUM 75 MG PO TBEC: 75.0000 mg | DELAYED_RELEASE_TABLET | Freq: Two times a day (BID) | ORAL | 2 refills | Status: DC

## 2021-07-30 MED ORDER — TRIAMCINOLONE ACETONIDE 10 MG/ML IJ SUSP
10.0000 mg | Freq: Once | INTRAMUSCULAR | Status: AC
  Administered 2021-07-30: 10 mg

## 2021-07-30 NOTE — Progress Notes (Signed)
Subjective:   Patient ID: Nancy Freeman, female   DOB: 51 y.o.   MRN: [id number]   HPI Patient states she developed a lot of pain on the outside of her left foot stating its been present for a fairly long time but is worsened recently.  She tries to be active this makes it hard has mild obesity does not smoke likes to be active if possible   Review of Systems  All other systems reviewed and are negative.      Objective:  Physical Exam Vitals and nursing note reviewed.  Constitutional:      Appearance: She is well-developed.  Pulmonary:     Effort: Pulmonary effort is normal.  Musculoskeletal:        General: Normal range of motion.  Skin:    General: Skin is warm.  Neurological:     Mental Status: She is alert.    Neurovascular status intact muscle strength adequate range of motion within normal limits with inflammation pain of the base of the fifth metatarsal left with fluid buildup around the area no indication of tendon dysfunction or muscle strength loss.  Good digital perfusion well oriented x3     Assessment:  Inflammatory peroneal tendinitis left that has been present for a fairly long time no indication to tear the tendon currently     Plan:  H&P reviewed condition I did recommend careful injection explaining chances for rupture and I did sterile prep and the sheath I injected the sheath of the peroneal tendon 3 mg Dexasone Kenalog 5 mg Xylocaine I applied fascial brace to lift up the lateral side of the foot along with aggressive ice and discussed possible orthotics to offload the site of the foot if symptoms return or possible casting.  Reappoint to recheck  X-rays were negative for signs that there is a fracture or any bony issue with this problem

## 2021-08-13 ENCOUNTER — Encounter: Payer: Self-pay | Admitting: Podiatry

## 2021-08-13 ENCOUNTER — Ambulatory Visit (INDEPENDENT_AMBULATORY_CARE_PROVIDER_SITE_OTHER): Payer: BC Managed Care – PPO | Admitting: Podiatry

## 2021-08-13 DIAGNOSIS — M7672 Peroneal tendinitis, left leg: Secondary | ICD-10-CM

## 2021-08-13 NOTE — Progress Notes (Signed)
Subjective:   Patient ID: Nancy Freeman, female   DOB: 51 y.o.   MRN: [id number]   HPI Patient states doing much better with the left foot with minimal discomfort   ROS      Objective:  Physical Exam  Neurovascular status intact significant improvement left lateral foot with inflammation pain improved to about 80%     Assessment:  Doing well post treatment for peroneal inflammation left     Plan:  Reviewed condition recommended continued ice therapy shoe gear modifications and brace therapy and if symptoms were to recur may require orthotics or other treatment with possible immobilization possible MRI

## 2021-12-04 ENCOUNTER — Ambulatory Visit (INDEPENDENT_AMBULATORY_CARE_PROVIDER_SITE_OTHER): Payer: BC Managed Care – PPO

## 2021-12-04 ENCOUNTER — Ambulatory Visit (INDEPENDENT_AMBULATORY_CARE_PROVIDER_SITE_OTHER): Payer: BC Managed Care – PPO | Admitting: Sports Medicine

## 2021-12-04 VITALS — BP 130/78 | HR 74 | Ht 66.0 in | Wt 191.0 lb

## 2021-12-04 DIAGNOSIS — M898X1 Other specified disorders of bone, shoulder: Secondary | ICD-10-CM

## 2021-12-04 DIAGNOSIS — M25511 Pain in right shoulder: Secondary | ICD-10-CM | POA: Diagnosis not present

## 2021-12-04 MED ORDER — MELOXICAM 15 MG PO TABS: 15.0000 mg | ORAL_TABLET | Freq: Every day | ORAL | 0 refills | Status: DC

## 2021-12-04 NOTE — Progress Notes (Signed)
Nancy Freeman D.Nancy Freeman Phone: [phone]   Assessment and Plan:     1. Pain of right clavicle 2. Pain in sternoclavicular joint, right -Chronic with exacerbation, initial sports medicine visit - >3 months of pain along clavicle and [place] joint since piece of furniture fell and hit patient in anterior chest wall in 08/2021 - I believe patient suffered a sprain of right [place] joint or a contusion directly to [facility] joint during event or furniture fell onto right-sided chest wall and this is led to patient's continued - Recommend avoiding sleeping on right shoulder to decrease compression forces on right [place] joint - Start meloxicam 15 mg daily x2 weeks.  If still having pain after 2 weeks, complete 3rd-week of meloxicam. May use remaining meloxicam as needed once daily for pain control.  Do not to use additional NSAIDs while taking meloxicam.  May use Tylenol [phone] mg 2 to 3 times a day for breakthrough pain. - X-ray obtained in clinic.  My interpretation: No dislocation seen at [facility] joint or [place] joint.  No acute fracture.  Other orders - meloxicam (MOBIC) 15 MG tablet; Take 1 tablet (15 mg total) by mouth daily.    Pertinent previous records reviewed include none   Follow Up: 3 to 4 weeks for reevaluation.  Could consider ultrasound versus immobilization versus CSI if no improvement or worsening of symptoms   Subjective:   I, Nancy Freeman, am serving as a Education administrator for Doctor Nancy Freeman  Chief Complaint: right clavicle pain   HPI:   12/04/21 Patient is a 51 year old female complaining of right clavicle pain. Patient states that July 4th she was carrying furniture up steps and it fell back on her and the pain hasnt gone away since then, no pain when moving arm, discomfort through the neck and down the scap and around to the shoulder , aggravates her more when she uses her upper body strength or after a night of  sleep where she has slept on that side, numbness tingling over her head and down to her hand , no meds for the discomfort, she will use a heating pad and that will help d will allow her to function or go to sleep, no clicking in that collar bone when she moves her arm ,    Relevant Historical Information: GERD  Additional pertinent review of systems negative.   Current Outpatient Medications:    meloxicam (MOBIC) 15 MG tablet, Take 1 tablet (15 mg total) by mouth daily., Disp: 30 tablet, Rfl: 0   diclofenac (VOLTAREN) 75 MG EC tablet, Take 1 tablet (75 mg total) by mouth 2 (two) times daily., Disp: 50 tablet, Rfl: 2   famotidine (PEPCID) 40 MG tablet, famotidine 40 mg tablet  TK 1 T PO BID, Disp: , Rfl:    hydrochlorothiazide (HYDRODIURIL) 12.5 MG tablet, Take 12.5 mg by mouth daily., Disp: , Rfl:    lisinopril (ZESTRIL) 10 MG tablet, Take 10 mg by mouth daily., Disp: , Rfl:    melatonin 3 MG TABS tablet, Take 3 mg by mouth at bedtime., Disp: , Rfl:    nitroGLYCERIN (NITROSTAT) 0.4 MG SL tablet, SMARTSIG:1 Tablet(s) Sublingual PRN, Disp: , Rfl:    Sod Picosulfate-Mag Ox-Cit Acd (CLENPIQ) 10-3.5-12 MG-GM -GM/160ML SOLN, Take 320 mLs by mouth as directed., Disp: 320 mL, Rfl: 0   SUMAtriptan (IMITREX) 50 MG tablet, Take 50 mg by mouth every 2 (two) hours as needed  for migraine. May repeat in 2 hours if headache persists or recurs., Disp: , Rfl:    VICTOZA 18 MG/3ML SOPN, Inject into the skin., Disp: , Rfl:    Objective:     Vitals:   12/04/21 1515  BP: 130/78  Pulse: 74  SpO2: 97%  Weight: 191 lb (86.6 kg)  Height: '5\' 6"'$  (1.676 m)      Body mass index is 30.83 kg/m.    Physical Exam:    Gen: Appears well, nad, nontoxic and pleasant Neuro:sensation intact, strength is 5/5 with df/pf/inv/ev, muscle tone wnl Skin: no suspicious lesion or defmority Psych: A&O, appropriate mood and affect  Right shoulder: no deformity, swelling or muscle wasting No scapular winging FF 180, abd  180, int 0, ext 90 TTP right [place] NTTP over the [place], clavicle, ac, coracoid, biceps groove, humerus, deltoid, trapezius, cervical spine Neg neer, hawkings, empty can, subscap liftoff, speeds, obriens, crossarm Neg ant drawer, sulcus sign, apprehension Negative Spurling's test bilat FROM of neck  Tenderness at [facility] joint when lying on right side  Electronically signed by:  Nancy Freeman D.N[facility] 3:56 PM 12/04/21

## 2021-12-04 NOTE — Patient Instructions (Addendum)
Good to see you  - Start meloxicam 15 mg daily x2 weeks.  If still having pain after 2 weeks, complete 3rd-week of meloxicam. May use remaining meloxicam as needed once daily for pain control.  Do not to use additional NSAIDs while taking meloxicam.  May use Tylenol [phone] mg 2 to 3 times a day for breakthrough pain. Try not sleep on right shoulder  3-4 week follow up

## 2021-12-23 NOTE — Progress Notes (Unsigned)
    Benito Mccreedy D.[place] Phone: [phone]   Assessment and Plan:     There are no diagnoses linked to this encounter.  ***   Pertinent previous records reviewed include ***   Follow Up: ***     Subjective:   I, Olumide Dolinger, am serving as a Education administrator for Doctor Glennon Mac   Chief Complaint: right clavicle pain    HPI:    12/04/21 Patient is a 51 year old female complaining of right clavicle pain. Patient states that July 4th she was carrying furniture up steps and it fell back on her and the pain hasnt gone away since then, no pain when moving arm, discomfort through the neck and down the scap and around to the shoulder , aggravates her more when she uses her upper body strength or after a night of sleep where she has slept on that side, numbness tingling over her head and down to her hand , no meds for the discomfort, she will use a heating pad and that will help d will allow her to function or go to sleep, no clicking in that collar bone when she moves her arm ,    12/24/2021 Patient states    Relevant Historical Information: GERD  Additional pertinent review of systems negative.   Current Outpatient Medications:    diclofenac (VOLTAREN) 75 MG EC tablet, Take 1 tablet (75 mg total) by mouth 2 (two) times daily., Disp: 50 tablet, Rfl: 2   famotidine (PEPCID) 40 MG tablet, famotidine 40 mg tablet  TK 1 T PO BID, Disp: , Rfl:    hydrochlorothiazide (HYDRODIURIL) 12.5 MG tablet, Take 12.5 mg by mouth daily., Disp: , Rfl:    lisinopril (ZESTRIL) 10 MG tablet, Take 10 mg by mouth daily., Disp: , Rfl:    melatonin 3 MG TABS tablet, Take 3 mg by mouth at bedtime., Disp: , Rfl:    meloxicam (MOBIC) 15 MG tablet, Take 1 tablet (15 mg total) by mouth daily., Disp: 30 tablet, Rfl: 0   nitroGLYCERIN (NITROSTAT) 0.4 MG SL tablet, SMARTSIG:1 Tablet(s) Sublingual PRN, Disp: , Rfl:    Sod Picosulfate-Mag  Ox-Cit Acd (CLENPIQ) 10-3.5-12 MG-GM -GM/160ML SOLN, Take 320 mLs by mouth as directed., Disp: 320 mL, Rfl: 0   SUMAtriptan (IMITREX) 50 MG tablet, Take 50 mg by mouth every 2 (two) hours as needed for migraine. May repeat in 2 hours if headache persists or recurs., Disp: , Rfl:    VICTOZA 18 MG/3ML SOPN, Inject into the skin., Disp: , Rfl:    Objective:     There were no vitals filed for this visit.    There is no height or weight on file to calculate BMI.    Physical Exam:    ***   Electronically signed by:  Benito Mccreedy D.[facility] 12:40 PM 12/23/21

## 2021-12-24 ENCOUNTER — Ambulatory Visit (INDEPENDENT_AMBULATORY_CARE_PROVIDER_SITE_OTHER): Payer: BC Managed Care – PPO | Admitting: Sports Medicine

## 2021-12-24 VITALS — BP 120/82 | HR 71 | Ht 66.0 in | Wt 194.0 lb

## 2021-12-24 DIAGNOSIS — M25511 Pain in right shoulder: Secondary | ICD-10-CM

## 2021-12-24 DIAGNOSIS — M898X1 Other specified disorders of bone, shoulder: Secondary | ICD-10-CM | POA: Diagnosis not present

## 2021-12-24 NOTE — Patient Instructions (Addendum)
Good to see you  Complete course of meloxicam  Immobilize shoulder and arm in sling Shoulder HEP once a day to prevent stiffness 2 week follow up

## 2022-01-06 NOTE — Progress Notes (Unsigned)
    Nancy Freeman D.[place] Phone: [phone]   Assessment and Plan:     There are no diagnoses linked to this encounter.  ***   Pertinent previous records reviewed include ***   Follow Up: ***     Subjective:   I, Nancy Freeman, am serving as a Education administrator for Doctor Glennon Mac   Chief Complaint: right clavicle pain    HPI:    12/04/21 Patient is a 51 year old female complaining of right clavicle pain. Patient states that July 4th she was carrying furniture up steps and it fell back on her and the pain hasnt gone away since then, no pain when moving arm, discomfort through the neck and down the scap and around to the shoulder , aggravates her more when she uses her upper body strength or after a night of sleep where she has slept on that side, numbness tingling over her head and down to her hand , no meds for the discomfort, she will use a heating pad and that will help d will allow her to function or go to sleep, no clicking in that collar bone when she moves her arm ,    12/24/2021 Patient states that the meloxicam has helped, still has some discomfort and can hear clicking now , not a painful clicking     33/35/4562 Patient states  Relevant Historical Information: GERD  Additional pertinent review of systems negative.   Current Outpatient Medications:    diclofenac (VOLTAREN) 75 MG EC tablet, Take 1 tablet (75 mg total) by mouth 2 (two) times daily., Disp: 50 tablet, Rfl: 2   famotidine (PEPCID) 40 MG tablet, famotidine 40 mg tablet  TK 1 T PO BID, Disp: , Rfl:    hydrochlorothiazide (HYDRODIURIL) 12.5 MG tablet, Take 12.5 mg by mouth daily., Disp: , Rfl:    lisinopril (ZESTRIL) 10 MG tablet, Take 10 mg by mouth daily., Disp: , Rfl:    melatonin 3 MG TABS tablet, Take 3 mg by mouth at bedtime., Disp: , Rfl:    meloxicam (MOBIC) 15 MG tablet, Take 1 tablet (15 mg total) by mouth daily., Disp:  30 tablet, Rfl: 0   nitroGLYCERIN (NITROSTAT) 0.4 MG SL tablet, SMARTSIG:1 Tablet(s) Sublingual PRN, Disp: , Rfl:    Sod Picosulfate-Mag Ox-Cit Acd (CLENPIQ) 10-3.5-12 MG-GM -GM/160ML SOLN, Take 320 mLs by mouth as directed., Disp: 320 mL, Rfl: 0   SUMAtriptan (IMITREX) 50 MG tablet, Take 50 mg by mouth every 2 (two) hours as needed for migraine. May repeat in 2 hours if headache persists or recurs., Disp: , Rfl:    VICTOZA 18 MG/3ML SOPN, Inject into the skin., Disp: , Rfl:    Objective:     There were no vitals filed for this visit.    There is no height or weight on file to calculate BMI.    Physical Exam:    ***   Electronically signed by:  Nancy Freeman D.[facility] 3:18 PM 01/06/22

## 2022-01-07 ENCOUNTER — Ambulatory Visit (INDEPENDENT_AMBULATORY_CARE_PROVIDER_SITE_OTHER): Payer: BC Managed Care – PPO | Admitting: Sports Medicine

## 2022-01-07 VITALS — HR 59 | Ht 66.0 in | Wt 196.0 lb

## 2022-01-07 DIAGNOSIS — M25511 Pain in right shoulder: Secondary | ICD-10-CM

## 2022-01-07 DIAGNOSIS — M898X1 Other specified disorders of bone, shoulder: Secondary | ICD-10-CM | POA: Diagnosis not present

## 2022-01-07 NOTE — Patient Instructions (Addendum)
Good to see you  MRI referral  Discontinue meloxicam and sling Follow up 3 days after MRI to discuss results

## 2022-01-13 ENCOUNTER — Ambulatory Visit: Payer: BC Managed Care – PPO | Admitting: Sports Medicine

## 2022-01-19 ENCOUNTER — Ambulatory Visit (INDEPENDENT_AMBULATORY_CARE_PROVIDER_SITE_OTHER): Payer: BC Managed Care – PPO | Admitting: Sports Medicine

## 2022-01-19 ENCOUNTER — Ambulatory Visit (INDEPENDENT_AMBULATORY_CARE_PROVIDER_SITE_OTHER): Payer: BC Managed Care – PPO

## 2022-01-19 DIAGNOSIS — M25511 Pain in right shoulder: Secondary | ICD-10-CM

## 2022-01-19 DIAGNOSIS — G8929 Other chronic pain: Secondary | ICD-10-CM | POA: Diagnosis not present

## 2022-01-19 DIAGNOSIS — M898X1 Other specified disorders of bone, shoulder: Secondary | ICD-10-CM

## 2022-01-19 MED ORDER — TRIAMCINOLONE ACETONIDE 40 MG/ML IJ SUSP
40.0000 mg | Freq: Once | INTRAMUSCULAR | Status: AC
  Administered 2022-01-19: 40 mg via INTRAMUSCULAR

## 2022-01-19 NOTE — Progress Notes (Signed)
    Procedures performed today:    Procedure: Real-time Ultrasound Guided gadolinium contrast injection of right glenohumeral joint Device: Samsung HS60  Verbal informed consent obtained.  Time-out conducted.  Noted no overlying erythema, induration, or other signs of local infection.  Skin prepped in a sterile fashion.  Local anesthesia: Topical Ethyl chloride.  With sterile technique and under real time ultrasound guidance: Mild effusion noted, 22-gauge spinal needle advanced into the glenohumeral joint from a posterior approach, injected 1 cc kenalog 40, 2 cc lidocaine, 2 cc bupivacaine, syringe switched and 0.1 cc Gilliam injected, syringe again switched and 10 cc sterile saline used to fully distend the joint. Joint visualized and capsule seen distending confirming intra-articular placement of contrast material and medication. Completed without difficulty  Advised to call if fevers/chills, erythema, induration, drainage, or persistent bleeding.  Images permanently stored in PACS Impression: Technically successful ultrasound guided gadolinium contrast injection for MR arthrography.  Please see separate MR arthrogram report.  Independent interpretation of notes and tests performed by another provider:   None.  Brief History, Exam, Impression, and Recommendations:    Chronic right shoulder pain Impingement type symptoms and signs, injection performed for MR arthrography, further management per primary treating provider. I did print her out some cuff conditioning exercises to go home with.    ____________________________________________ Gwen Her. Dianah Field, M.D., ABFM., CAQSM., AME. Primary Care and Sports Medicine [facility]  Adjunct Professor of [employer] [employer]  Risk manager

## 2022-01-19 NOTE — Addendum Note (Signed)
Addended by: Tarri Glenn A on: 01/19/2022 11:17 AM   Modules accepted: Orders

## 2022-01-19 NOTE — Assessment & Plan Note (Signed)
Impingement type symptoms and signs, injection performed for MR arthrography, further management per primary treating provider. I did print her out some cuff conditioning exercises to go home with.

## 2022-01-21 ENCOUNTER — Ambulatory Visit: Payer: BC Managed Care – PPO | Admitting: Sports Medicine

## 2022-01-21 NOTE — Progress Notes (Unsigned)
    Nancy Freeman Phone: [phone]   Assessment and Plan:     There are no diagnoses linked to this encounter.  ***   Pertinent previous records reviewed include ***   Follow Up: ***     Subjective:   I, Nancy Freeman, am serving as a Education administrator for Nancy Freeman   Chief Complaint: right clavicle pain    HPI:    12/04/21 Patient is a 51 year old female complaining of right clavicle pain. Patient states that July 4th she was carrying furniture up steps and it fell back on her and the pain hasnt gone away since then, no pain when moving arm, discomfort through the neck and down the scap and around to the shoulder , aggravates her more when she uses her upper body strength or after a night of sleep where she has slept on that side, numbness tingling over her head and down to her hand , no meds for the discomfort, she will use a heating pad and that will help d will allow her to function or go to sleep, no clicking in that collar bone when she moves her arm ,    12/24/2021 Patient states that the meloxicam has helped, still has some discomfort and can hear clicking now , not a painful clicking     09/38/1829 Patient states the sling sucked made it more uncomfortable, pain still the same    01/22/2022 Patient states    Relevant Historical Information: GERD  Additional pertinent review of systems negative.   Current Outpatient Medications:    diclofenac (VOLTAREN) 75 MG EC tablet, Take 1 tablet (75 mg total) by mouth 2 (two) times daily., Disp: 50 tablet, Rfl: 2   famotidine (PEPCID) 40 MG tablet, famotidine 40 mg tablet  TK 1 T PO BID, Disp: , Rfl:    hydrochlorothiazide (HYDRODIURIL) 12.5 MG tablet, Take 12.5 mg by mouth daily., Disp: , Rfl:    lisinopril (ZESTRIL) 10 MG tablet, Take 10 mg by mouth daily., Disp: , Rfl:    melatonin 3 MG TABS tablet, Take 3 mg by mouth at bedtime.,  Disp: , Rfl:    meloxicam (MOBIC) 15 MG tablet, Take 1 tablet (15 mg total) by mouth daily., Disp: 30 tablet, Rfl: 0   nitroGLYCERIN (NITROSTAT) 0.4 MG SL tablet, SMARTSIG:1 Tablet(s) Sublingual PRN, Disp: , Rfl:    Sod Picosulfate-Mag Ox-Cit Acd (CLENPIQ) 10-3.5-12 MG-GM -GM/160ML SOLN, Take 320 mLs by mouth as directed., Disp: 320 mL, Rfl: 0   SUMAtriptan (IMITREX) 50 MG tablet, Take 50 mg by mouth every 2 (two) hours as needed for migraine. May repeat in 2 hours if headache persists or recurs., Disp: , Rfl:    VICTOZA 18 MG/3ML SOPN, Inject into the skin., Disp: , Rfl:    Objective:     There were no vitals filed for this visit.    There is no height or weight on file to calculate BMI.    Physical Exam:    ***   Electronically signed by:  Nancy Mccreedy D.[facility] 8:28 AM 01/21/22

## 2022-01-22 ENCOUNTER — Ambulatory Visit (INDEPENDENT_AMBULATORY_CARE_PROVIDER_SITE_OTHER): Payer: BC Managed Care – PPO | Admitting: Sports Medicine

## 2022-01-22 VITALS — BP 110/80 | HR 56 | Ht 66.0 in | Wt 196.0 lb

## 2022-01-22 DIAGNOSIS — S43431A Superior glenoid labrum lesion of right shoulder, initial encounter: Secondary | ICD-10-CM | POA: Diagnosis not present

## 2022-01-22 DIAGNOSIS — M25511 Pain in right shoulder: Secondary | ICD-10-CM

## 2022-01-22 DIAGNOSIS — M898X1 Other specified disorders of bone, shoulder: Secondary | ICD-10-CM | POA: Diagnosis not present

## 2022-01-22 DIAGNOSIS — G8929 Other chronic pain: Secondary | ICD-10-CM | POA: Diagnosis not present

## 2022-01-22 NOTE — Patient Instructions (Addendum)
Good to see you  Orthopedic surgery referral  Follow up as needed

## 2022-01-28 ENCOUNTER — Encounter: Payer: Self-pay | Admitting: Orthopedic Surgery

## 2022-01-28 ENCOUNTER — Ambulatory Visit: Payer: Self-pay

## 2022-01-28 ENCOUNTER — Ambulatory Visit (INDEPENDENT_AMBULATORY_CARE_PROVIDER_SITE_OTHER): Payer: BC Managed Care – PPO | Admitting: Orthopedic Surgery

## 2022-01-28 DIAGNOSIS — S43431A Superior glenoid labrum lesion of right shoulder, initial encounter: Secondary | ICD-10-CM | POA: Diagnosis not present

## 2022-01-28 DIAGNOSIS — G8929 Other chronic pain: Secondary | ICD-10-CM | POA: Diagnosis not present

## 2022-01-28 DIAGNOSIS — M25511 Pain in right shoulder: Secondary | ICD-10-CM

## 2022-01-28 DIAGNOSIS — M19011 Primary osteoarthritis, right shoulder: Secondary | ICD-10-CM | POA: Diagnosis not present

## 2022-01-30 ENCOUNTER — Encounter: Payer: Self-pay | Admitting: Orthopedic Surgery

## 2022-01-30 NOTE — Progress Notes (Unsigned)
Office Visit Note   Patient: Nancy Freeman           Date of Birth: 1970/05/03           MRN: [id number] Visit Date: 01/28/2022 Requested by: Glennon Mac, [place],  [place] [id number] PCP: Rutherford Limerick, PA  Subjective: Chief Complaint  Patient presents with   Right Shoulder - Pain    HPI: Nancy Freeman is a 51 y.o. female who presents to the office reporting right shoulder pain since July.  She had an injury while she was moving furniture.  This was primarily a posteriorly directed force on her right shoulder with her arm forward flexed over 90 degrees.  She has tried an injection which did not help.  She has also tried home exercise program.  Also used a sling for several weeks.  Took meloxicam as well.  Pain does wake her from sleep at night at times.  She reports new popping and clicking in the right shoulder.  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  She has had an MRI scan which is reviewed.  She does have a displaced tear of the posterior and superior labrum with moderate degenerative AC joint arthropathy and minimal glenohumeral spurring.  Rotator cuff intact..                ROS: All systems reviewed are negative as they relate to the chief complaint within the history of present illness.  Patient denies fevers or chills.  Assessment & Plan: Visit Diagnoses:  1. Chronic right shoulder pain   2. Labral tear of shoulder, right, initial encounter     Plan: Impression is right shoulder pain with posterior laxity on exam and AC joint tenderness on exam.  This is consistent with her MRI findings.  She has failed conservative management.  Symptoms are debilitating and severe.  Does not look like she has any frozen shoulder or rotator cuff pathology.  Plan at this time would be right shoulder arthroscopy with posterior labral repair and possible biceps tenodesis and distal clavicle excision.  At the time of this dictation Nancy Freeman did describe relief from the [facility]  joint injection which has recurred slightly due to lifting a box.  This positive response in addition to the edema present on MRI scan would indicate that that is at least a partial pain generator in her current constellation of symptoms and we will plan to address it with distal clavicle excision at the time of surgery.  Risk and benefits of the surgery are discussed including not limited to infection or vessel damage incomplete restoration of function as well as the relatively prolonged rehabilitative effort required.  Patient understands risk benefits and wishes to proceed.  All questions answered   Follow-Up Instructions: No follow-ups on file.   Orders:  Orders Placed This Encounter  Procedures   US Guided Needle Placement - No Linked Charges   No orders of the defined types were placed in this encounter.     Procedures: Medium Joint Inj: R acromioclavicular on 01/28/2022 7:28 AM Indications: pain and diagnostic evaluation Details: 27 G 1.5 in needle, ultrasound-guided superior approach Medications: 13.33 mg methylPREDNISolone acetate 40 MG/ML; 0.66 mL bupivacaine 0.25 %; 3 mL lidocaine 1 % Outcome: tolerated well, no immediate complications Procedure, treatment alternatives, risks and benefits explained, specific risks discussed. Consent was given by the patient. Immediately prior to procedure a time out was called to verify the correct patient, procedure, equipment, support  staff and site/side marked as required. Patient was prepped and draped in the usual sterile fashion.      Clinical Data: No additional findings.  Objective: Vital Signs: There were no vitals taken for this visit.  Physical Exam:  Constitutional: Patient appears well-developed HEENT:  Head: Normocephalic Eyes:EOM are normal Neck: Normal range of motion Cardiovascular: Normal rate Pulmonary/chest: Effort normal Neurologic: Patient is alert Skin: Skin is warm Psychiatric: Patient has normal mood and  affect  Ortho Exam: Ortho exam demonstrates range of motion on the right of 65/100/170.  She does have increased posterior laxity of 2+ out of 3 with anterior laxity slightly less than 1 out of 3 with less than a centimeter sulcus sign.  O'Brien's testing is positive on the right negative on the left.  Speeds testing positive on the right negative on the left.  Patient does have tenderness to palpation of the [facility] joint along with tenderness to crossarm adduction on the right-hand side.  Positive apprehension and relocation testing for posterior instability on the right as well which is not present on the left  Specialty Comments:  No specialty comments available.  Imaging: No results found.   PMFS History: Patient Active Problem List   Diagnosis Date Noted   Chronic right shoulder pain 01/19/2022   Personal history of colonic polyps    Polyp of sigmoid colon    Gastroesophageal reflux disease without esophagitis    BRCA negative 10/13/2019   Family history of ovarian cancer 09/18/2019   Prolapsed internal hemorrhoids, grade 2 05/19/2016   Past Medical History:  Diagnosis Date   Allergy    Anginal pain ([place])    Normal TTE and myocardial perfusion scan 04/24/20   Anxiety    BRCA negative 08/2019   MyRisk neg   Family history of breast cancer 08/2019   IBIS=14.3%/riskscore=14.9%   Family history of ovarian cancer    GERD (gastroesophageal reflux disease)    Headache    migraines  1-2x/month   Hidradenoma VULVAR   Hypertension    IBS (irritable bowel syndrome)    Internal hemorrhoid     Family History  Problem Relation Age of Onset   Ovarian cancer Mother    Uterine cancer Mother    Breast cancer Mother 108   Diabetes Father    Kidney disease Paternal Grandmother    Breast cancer Maternal Aunt 38   Esophageal cancer Neg Hx    Colon cancer Neg Hx    Stomach cancer Neg Hx    Pancreatic cancer Neg Hx    Liver disease Neg Hx    Rectal cancer Neg Hx     Past Surgical  History:  Procedure Laterality Date   ABDOMINAL HYSTERECTOMY  01-19-2001  DR Ubaldo Glassing   W/ LEFT SALPINGO-OOPHORECTOMY AND EXCISION ENDOMETRIOSIS   BREAST BIOPSY Left 09/22/2017   affirm bx, pash   CESAREAN SECTION     COLONOSCOPY     COLONOSCOPY WITH PROPOFOL N/A 05/10/2020   Procedure: COLONOSCOPY WITH PROPOFOL;  Surgeon: Lucilla Lame, MD;  Location: [place];  Service: Endoscopy;  Laterality: N/A;   ESOPHAGOGASTRODUODENOSCOPY (EGD) WITH PROPOFOL N/A 05/10/2020   Procedure: ESOPHAGOGASTRODUODENOSCOPY (EGD) WITH PROPOFOL;  Surgeon: Lucilla Lame, MD;  Location: [place];  Service: Endoscopy;  Laterality: N/A;   HEMORRHOID BANDING  2018   POLYPECTOMY N/A 05/10/2020   Procedure: POLYPECTOMY;  Surgeon: Lucilla Lame, MD;  Location: [place];  Service: Endoscopy;  Laterality: N/A;   REMOVAL OF SIDE PLATE, DISTAL FIBULA, RIGHT  ANKLE  05-08-2004   UPPER GASTROINTESTINAL ENDOSCOPY     Social History   Occupational History   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

## 2022-01-31 MED ORDER — LIDOCAINE HCL 1 % IJ SOLN
3.0000 mL | INTRAMUSCULAR | Status: AC | PRN
  Administered 2022-01-28: 3 mL

## 2022-01-31 MED ORDER — BUPIVACAINE HCL 0.25 % IJ SOLN
0.6600 mL | INTRAMUSCULAR | Status: AC | PRN
  Administered 2022-01-28: .66 mL via INTRA_ARTICULAR

## 2022-01-31 MED ORDER — METHYLPREDNISOLONE ACETATE 40 MG/ML IJ SUSP
13.3300 mg | INTRAMUSCULAR | Status: AC | PRN
  Administered 2022-01-28: 13.33 mg via INTRA_ARTICULAR

## 2022-02-13 NOTE — Pre-Procedure Instructions (Signed)
Surgical Instructions    Your procedure is scheduled on Tuesday, January 2nd.  Report to [facility] Main Entrance "A" at 09:10 A.M., then check in with the Admitting office.  Call this number if you have problems the morning of surgery:  [phone]   If you have any questions prior to your surgery date call [phone]: Open Monday-Friday 8am-4pm    Remember:  Do not eat after midnight the night before your surgery  You may drink clear liquids until 08:10 AM the morning of your surgery.   Clear liquids allowed are: Water, Non-Citrus Juices (without pulp), Carbonated Beverages, Clear Tea, Black Coffee Only (NO MILK, CREAM OR POWDERED CREAMER of any kind), and Gatorade.   Patient Instructions  The night before surgery:  No food after midnight. ONLY clear liquids after midnight  The day of surgery (if you do NOT have diabetes):  Drink ONE (1) Pre-Surgery Clear Ensure by 08:10 AM the morning of surgery. Drink in one sitting. Do not sip.  This drink was given to you during your hospital  pre-op appointment visit.  Nothing else to drink after completing the  Pre-Surgery Clear Ensure.         If you have questions, please contact your surgeon's office.     Take these medicines the morning of surgery with A SIP OF WATER   If needed: famotidine (PEPCID)  nitroGLYCERIN (NITROSTAT)  SUMAtriptan (IMITREX)    As of today, STOP taking any Aspirin (unless otherwise instructed by your surgeon) Aleve, Naproxen, Ibuprofen, Motrin, Advil, Goody's, BC's, all herbal medications, fish oil, and all vitamins.  DO NOT TAKE VICTOZA FOR 7 DAYS PRIOR TO SURGERY                    Do NOT Smoke (Tobacco/Vaping) for 24 hours prior to your procedure.  If you use a CPAP at night, you may bring your mask/headgear for your overnight stay.   Contacts, glasses, piercing's, hearing aid's, dentures or partials may not be worn into surgery, please bring cases for these belongings.    For patients  admitted to the hospital, discharge time will be determined by your treatment team.   Patients discharged the day of surgery will not be allowed to drive home, and someone needs to stay with them for 24 hours.  SURGICAL WAITING ROOM VISITATION Patients having surgery or a procedure may have no more than 2 support people in the waiting area - these visitors may rotate.   Children under the age of 29 must have an adult with them who is not the patient. If the patient needs to stay at the hospital during part of their recovery, the visitor guidelines for inpatient rooms apply. Pre-op nurse will coordinate an appropriate time for 1 support person to accompany patient in pre-op.  This support person may not rotate.   Please refer to the [facility] website for the visitor guidelines for Inpatients (after your surgery is over and you are in a regular room).    Special instructions:   [place]- Preparing For Surgery  Before surgery, you can play an important role. Because skin is not sterile, your skin needs to be as free of germs as possible. You can reduce the number of germs on your skin by washing with CHG (chlorahexidine gluconate) Soap before surgery.  CHG is an antiseptic cleaner which kills germs and bonds with the skin to continue killing germs even after washing.    Oral Hygiene is also important to  reduce your risk of infection.  Remember - BRUSH YOUR TEETH THE MORNING OF SURGERY WITH YOUR REGULAR TOOTHPASTE  Please do not use if you have an allergy to CHG or antibacterial soaps. If your skin becomes reddened/irritated stop using the CHG.  Do not shave (including legs and underarms) for at least 48 hours prior to first CHG shower. It is OK to shave your face.  Please follow these instructions carefully.   Shower the NIGHT BEFORE SURGERY and the MORNING OF SURGERY  If you chose to wash your hair, wash your hair first as usual with your normal shampoo.  After you shampoo, rinse your  hair and body thoroughly to remove the shampoo.  Use CHG Soap as you would any other liquid soap. You can apply CHG directly to the skin and wash gently with a scrungie or a clean washcloth.   Apply the CHG Soap to your body ONLY FROM THE NECK DOWN.  Do not use on open wounds or open sores. Avoid contact with your eyes, ears, mouth and genitals (private parts). Wash Face and genitals (private parts)  with your normal soap.   Wash thoroughly, paying special attention to the area where your surgery will be performed.  Thoroughly rinse your body with warm water from the neck down.  DO NOT shower/wash with your normal soap after using and rinsing off the CHG Soap.  Pat yourself dry with a CLEAN TOWEL.  Wear CLEAN PAJAMAS to bed the night before surgery  Place CLEAN SHEETS on your bed the night before your surgery  DO NOT SLEEP WITH PETS.   Day of Surgery: Take a shower with CHG soap. Do not wear jewelry or makeup Do not wear lotions, powders, perfumes, or deodorant. Do not shave 48 hours prior to surgery.  Do not bring valuables to the hospital. [facility] is not responsible for any belongings or valuables. Do not wear nail polish, gel polish, artificial nails, or any other type of covering on natural nails (fingers and toes) If you have artificial nails or gel coating that need to be removed by a nail salon, please have this removed prior to surgery. Artificial nails or gel coating may interfere with anesthesia's ability to adequately monitor your vital signs. Wear Clean/Comfortable clothing the morning of surgery Remember to brush your teeth WITH YOUR REGULAR TOOTHPASTE.   Please read over the following fact sheets that you were given.    If you received a COVID test during your pre-op visit  it is requested that you wear a mask when out in public, stay away from anyone that may not be feeling well and notify your surgeon if you develop symptoms. If you have been in contact with  anyone that has tested positive in the last 10 days please notify you surgeon.

## 2022-02-17 ENCOUNTER — Inpatient Hospital Stay (HOSPITAL_COMMUNITY)
Admission: RE | Admit: 2022-02-17 | Discharge: 2022-02-17 | Disposition: A | Discharge status: Home / Self Care | Payer: BC Managed Care – PPO | Source: Ambulatory Visit

## 2022-02-17 NOTE — Progress Notes (Signed)
Pt's husband called in to inform that the pt tested positive for Covid on 02/15/22. Husband advised for them call Dr. Randel Pigg office to inform him, as well.

## 2022-02-18 ENCOUNTER — Telehealth: Payer: Self-pay | Admitting: Orthopedic Surgery

## 2022-02-18 NOTE — Telephone Encounter (Signed)
Can you open [place] up for people on Monday pm if no surgery  thx

## 2022-02-18 NOTE — Telephone Encounter (Signed)
Patient was scheduled 02-24-22 at [facility] with Dr. Marlou Sa for right shoulder arthroscopy, posterior labral repair, debridement, possible arthroscopic distal clavicle excision, biceps tenodesis.  Surgery was cancelled. Patient tested positive for Covid on 02-16-22. Had fever, fatigue, and cough.   Husband tested positive on 02-14-22.  Patient stated she is taking Paxlovid for 5 days (started yesterday 02-18-23).  Preadmission visit was cancelled by patient and was advised to call Dr. Marlou Sa to reschedule.  Patient has been rescheduled to Friday 03-20-22 at [facility].

## 2022-02-24 DIAGNOSIS — Z01818 Encounter for other preprocedural examination: Secondary | ICD-10-CM

## 2022-03-04 ENCOUNTER — Encounter: Payer: BC Managed Care – PPO | Admitting: Orthopedic Surgery

## 2022-03-13 ENCOUNTER — Encounter (HOSPITAL_BASED_OUTPATIENT_CLINIC_OR_DEPARTMENT_OTHER): Payer: Self-pay | Admitting: Orthopedic Surgery

## 2022-03-13 ENCOUNTER — Other Ambulatory Visit: Payer: Self-pay

## 2022-03-18 ENCOUNTER — Other Ambulatory Visit: Payer: Self-pay

## 2022-03-18 ENCOUNTER — Encounter (HOSPITAL_BASED_OUTPATIENT_CLINIC_OR_DEPARTMENT_OTHER)
Admission: RE | Admit: 2022-03-18 | Discharge: 2022-03-18 | Disposition: A | Discharge status: Home / Self Care | Payer: BC Managed Care – PPO | Source: Ambulatory Visit | Attending: Orthopedic Surgery | Admitting: Orthopedic Surgery

## 2022-03-18 DIAGNOSIS — K219 Gastro-esophageal reflux disease without esophagitis: Secondary | ICD-10-CM | POA: Diagnosis not present

## 2022-03-18 DIAGNOSIS — I10 Essential (primary) hypertension: Secondary | ICD-10-CM | POA: Insufficient documentation

## 2022-03-18 DIAGNOSIS — S43431A Superior glenoid labrum lesion of right shoulder, initial encounter: Secondary | ICD-10-CM | POA: Diagnosis not present

## 2022-03-18 DIAGNOSIS — X58XXXA Exposure to other specified factors, initial encounter: Secondary | ICD-10-CM | POA: Diagnosis not present

## 2022-03-18 DIAGNOSIS — Z79899 Other long term (current) drug therapy: Secondary | ICD-10-CM | POA: Diagnosis not present

## 2022-03-18 DIAGNOSIS — Z01818 Encounter for other preprocedural examination: Secondary | ICD-10-CM | POA: Insufficient documentation

## 2022-03-18 DIAGNOSIS — S43491A Other sprain of right shoulder joint, initial encounter: Secondary | ICD-10-CM | POA: Diagnosis not present

## 2022-03-18 DIAGNOSIS — M19011 Primary osteoarthritis, right shoulder: Secondary | ICD-10-CM | POA: Diagnosis not present

## 2022-03-18 DIAGNOSIS — M25311 Other instability, right shoulder: Secondary | ICD-10-CM | POA: Diagnosis not present

## 2022-03-18 DIAGNOSIS — R001 Bradycardia, unspecified: Secondary | ICD-10-CM | POA: Insufficient documentation

## 2022-03-18 LAB — CBC
HCT: 38.7 % (ref 36.0–46.0)
Hemoglobin: 13.3 g/dL (ref 12.0–15.0)
MCH: 29.8 pg (ref 26.0–34.0)
MCHC: 34.4 g/dL (ref 30.0–36.0)
MCV: 86.6 fL (ref 80.0–100.0)
Platelets: 174 10*3/uL (ref 150–400)
RBC: 4.47 MIL/uL (ref 3.87–5.11)
RDW: 11.9 % (ref 11.5–15.5)
WBC: 4.3 10*3/uL (ref 4.0–10.5)
nRBC: 0 % (ref 0.0–0.2)

## 2022-03-18 LAB — BASIC METABOLIC PANEL
Anion gap: 7 (ref 5–15)
BUN: 15 mg/dL (ref 6–20)
CO2: 29 mmol/L (ref 22–32)
Calcium: 8.7 mg/dL — ABNORMAL LOW (ref 8.9–10.3)
Chloride: 102 mmol/L (ref 98–111)
Creatinine, Ser: 0.93 mg/dL (ref 0.44–1.00)
GFR, Estimated: >60 mL/min (ref >60)
Glucose, Bld: 96 mg/dL (ref 70–99)
Potassium: 3.2 mmol/L — ABNORMAL LOW (ref 3.5–5.1)
Sodium: 138 mmol/L (ref 135–145)

## 2022-03-18 NOTE — Progress Notes (Signed)
      Enhanced Recovery after Surgery for Orthopedics Enhanced Recovery after Surgery is a protocol used to improve the stress on your body and your recovery after surgery.  Patient Instructions  The night before surgery:  No food after midnight. ONLY clear liquids after midnight  The day of surgery (if you do NOT have diabetes):  Drink ONE (1) Pre-Surgery Clear Ensure as directed.   This drink was given to you during your hospital  pre-op appointment visit. The pre-op nurse will instruct you on the time to drink the  Pre-Surgery Ensure depending on your surgery time. Finish the drink at the designated time by the pre-op nurse.  Nothing else to drink after completing the  Pre-Surgery Clear Ensure.  The day of surgery (if you have diabetes): Drink ONE (1) Gatorade 2 (G2) as directed. This drink was given to you during your hospital  pre-op appointment visit.  The pre-op nurse will instruct you on the time to drink the   Gatorade 2 (G2) depending on your surgery time. Color of the Gatorade may vary. Red is not allowed. Nothing else to drink after completing the  Gatorade 2 (G2).         If you have questions, please contact your surgeon's office.  Benzoyl peroxide gel given to pt and written instructions on how and when to use it.  Pt verbalized understandings.

## 2022-03-19 NOTE — Progress Notes (Signed)
Potassium level reviewed with Dr. Roanna Banning. No new orders given, may proceed at [facility].

## 2022-03-20 ENCOUNTER — Ambulatory Visit (HOSPITAL_BASED_OUTPATIENT_CLINIC_OR_DEPARTMENT_OTHER): Payer: BC Managed Care – PPO | Admitting: Vascular Surgery

## 2022-03-20 ENCOUNTER — Encounter (HOSPITAL_BASED_OUTPATIENT_CLINIC_OR_DEPARTMENT_OTHER): Payer: Self-pay | Admitting: Orthopedic Surgery

## 2022-03-20 ENCOUNTER — Other Ambulatory Visit: Payer: Self-pay

## 2022-03-20 ENCOUNTER — Ambulatory Visit (HOSPITAL_BASED_OUTPATIENT_CLINIC_OR_DEPARTMENT_OTHER)
Admission: RE | Admit: 2022-03-20 | Discharge: 2022-03-20 | Disposition: A | Discharge status: Home / Self Care | Payer: BC Managed Care – PPO | Attending: Orthopedic Surgery | Admitting: Orthopedic Surgery

## 2022-03-20 ENCOUNTER — Encounter: Payer: Self-pay | Admitting: Orthopedic Surgery

## 2022-03-20 ENCOUNTER — Encounter (HOSPITAL_BASED_OUTPATIENT_CLINIC_OR_DEPARTMENT_OTHER)
Admission: RE | Disposition: A | Discharge status: Home / Self Care | Payer: Self-pay | Source: Home / Self Care | Attending: Orthopedic Surgery

## 2022-03-20 ENCOUNTER — Ambulatory Visit (HOSPITAL_BASED_OUTPATIENT_CLINIC_OR_DEPARTMENT_OTHER): Payer: BC Managed Care – PPO | Admitting: Physician Assistant

## 2022-03-20 DIAGNOSIS — M7521 Bicipital tendinitis, right shoulder: Secondary | ICD-10-CM

## 2022-03-20 DIAGNOSIS — M19011 Primary osteoarthritis, right shoulder: Secondary | ICD-10-CM | POA: Insufficient documentation

## 2022-03-20 DIAGNOSIS — Z01818 Encounter for other preprocedural examination: Secondary | ICD-10-CM

## 2022-03-20 DIAGNOSIS — I10 Essential (primary) hypertension: Secondary | ICD-10-CM | POA: Insufficient documentation

## 2022-03-20 DIAGNOSIS — S43431D Superior glenoid labrum lesion of right shoulder, subsequent encounter: Secondary | ICD-10-CM

## 2022-03-20 DIAGNOSIS — S43491A Other sprain of right shoulder joint, initial encounter: Secondary | ICD-10-CM | POA: Insufficient documentation

## 2022-03-20 DIAGNOSIS — S43431A Superior glenoid labrum lesion of right shoulder, initial encounter: Secondary | ICD-10-CM

## 2022-03-20 DIAGNOSIS — X58XXXA Exposure to other specified factors, initial encounter: Secondary | ICD-10-CM | POA: Insufficient documentation

## 2022-03-20 DIAGNOSIS — M25311 Other instability, right shoulder: Secondary | ICD-10-CM | POA: Diagnosis not present

## 2022-03-20 DIAGNOSIS — K219 Gastro-esophageal reflux disease without esophagitis: Secondary | ICD-10-CM | POA: Insufficient documentation

## 2022-03-20 DIAGNOSIS — Z79899 Other long term (current) drug therapy: Secondary | ICD-10-CM | POA: Insufficient documentation

## 2022-03-20 HISTORY — PX: SHOULDER ARTHROSCOPY WITH OPEN ROTATOR CUFF REPAIR AND DISTAL CLAVICLE ACROMINECTOMY: SHX5683

## 2022-03-20 HISTORY — PX: BICEPT TENODESIS: SHX5116

## 2022-03-20 SURGERY — SHOULDER ARTHROSCOPY WITH OPEN ROTATOR CUFF REPAIR AND DISTAL CLAVICLE ACROMINECTOMY
Anesthesia: Regional | Site: Shoulder | Laterality: Right

## 2022-03-20 MED ORDER — OXYCODONE HCL 5 MG PO TABS: 5.0000 mg | ORAL_TABLET | ORAL | 0 refills | Status: DC | PRN

## 2022-03-20 MED ORDER — SUGAMMADEX SODIUM 500 MG/5ML IV SOLN
INTRAVENOUS | Status: AC
  Filled 2022-03-20: qty 5

## 2022-03-20 MED ORDER — FENTANYL CITRATE (PF) 100 MCG/2ML IJ SOLN
INTRAMUSCULAR | Status: DC | PRN
  Administered 2022-03-20: 100 ug via INTRAVENOUS

## 2022-03-20 MED ORDER — FENTANYL CITRATE (PF) 100 MCG/2ML IJ SOLN
100.0000 ug | Freq: Once | INTRAMUSCULAR | Status: AC
  Administered 2022-03-20: 50 ug via INTRAVENOUS

## 2022-03-20 MED ORDER — MIDAZOLAM HCL 2 MG/2ML IJ SOLN
2.0000 mg | Freq: Once | INTRAMUSCULAR | Status: AC
  Administered 2022-03-20: 2 mg via INTRAVENOUS

## 2022-03-20 MED ORDER — TRANEXAMIC ACID-NACL 1000-0.7 MG/100ML-% IV SOLN
INTRAVENOUS | Status: AC
  Filled 2022-03-20: qty 100

## 2022-03-20 MED ORDER — EPINEPHRINE PF 1 MG/ML IJ SOLN
INTRAMUSCULAR | Status: DC | PRN
  Administered 2022-03-20: .5 mg

## 2022-03-20 MED ORDER — ONDANSETRON HCL 4 MG/2ML IJ SOLN
INTRAMUSCULAR | Status: AC
  Filled 2022-03-20: qty 2

## 2022-03-20 MED ORDER — CEFAZOLIN SODIUM-DEXTROSE 2-4 GM/100ML-% IV SOLN
INTRAVENOUS | Status: AC
  Filled 2022-03-20: qty 100

## 2022-03-20 MED ORDER — DEXAMETHASONE SODIUM PHOSPHATE 10 MG/ML IJ SOLN
INTRAMUSCULAR | Status: AC
  Filled 2022-03-20: qty 1

## 2022-03-20 MED ORDER — OXYCODONE HCL 5 MG/5ML PO SOLN: 5.0000 mg | Freq: Once | ORAL | Status: DC | PRN

## 2022-03-20 MED ORDER — PHENYLEPHRINE HCL (PRESSORS) 10 MG/ML IV SOLN
INTRAVENOUS | Status: AC
  Filled 2022-03-20: qty 1

## 2022-03-20 MED ORDER — MELOXICAM 15 MG PO TABS: 15.0000 mg | ORAL_TABLET | Freq: Every day | ORAL | 0 refills | Status: DC

## 2022-03-20 MED ORDER — PROPOFOL 10 MG/ML IV BOLUS
INTRAVENOUS | Status: DC | PRN
  Administered 2022-03-20: 100 mg via INTRAVENOUS
  Administered 2022-03-20: 50 mg via INTRAVENOUS

## 2022-03-20 MED ORDER — DEXAMETHASONE SODIUM PHOSPHATE 4 MG/ML IJ SOLN
INTRAMUSCULAR | Status: DC | PRN
  Administered 2022-03-20: 5 mg via INTRAVENOUS

## 2022-03-20 MED ORDER — KETOROLAC TROMETHAMINE 30 MG/ML IJ SOLN
INTRAMUSCULAR | Status: AC
  Filled 2022-03-20: qty 1

## 2022-03-20 MED ORDER — SUGAMMADEX SODIUM 500 MG/5ML IV SOLN
INTRAVENOUS | Status: DC | PRN
  Administered 2022-03-20: 320 mg via INTRAVENOUS

## 2022-03-20 MED ORDER — ONDANSETRON HCL 4 MG/2ML IJ SOLN
INTRAMUSCULAR | Status: DC | PRN
  Administered 2022-03-20: 4 mg via INTRAVENOUS

## 2022-03-20 MED ORDER — METHOCARBAMOL 500 MG PO TABS: 500.0000 mg | ORAL_TABLET | Freq: Three times a day (TID) | ORAL | 1 refills | Status: DC | PRN

## 2022-03-20 MED ORDER — SODIUM CHLORIDE 0.9 % IR SOLN
Status: DC | PRN
  Administered 2022-03-20: 18000 mL

## 2022-03-20 MED ORDER — BUPIVACAINE HCL (PF) 0.5 % IJ SOLN
INTRAMUSCULAR | Status: AC
  Filled 2022-03-20: qty 30

## 2022-03-20 MED ORDER — AMISULPRIDE (ANTIEMETIC) 5 MG/2ML IV SOLN: 10.0000 mg | Freq: Once | INTRAVENOUS | Status: DC | PRN

## 2022-03-20 MED ORDER — EPINEPHRINE PF 1 MG/ML IJ SOLN
INTRAMUSCULAR | Status: AC
  Filled 2022-03-20: qty 4

## 2022-03-20 MED ORDER — FENTANYL CITRATE (PF) 100 MCG/2ML IJ SOLN
INTRAMUSCULAR | Status: AC
  Filled 2022-03-20: qty 2

## 2022-03-20 MED ORDER — ROCURONIUM BROMIDE 10 MG/ML (PF) SYRINGE
PREFILLED_SYRINGE | INTRAVENOUS | Status: AC
  Filled 2022-03-20: qty 10

## 2022-03-20 MED ORDER — CEFAZOLIN SODIUM-DEXTROSE 2-4 GM/100ML-% IV SOLN
2.0000 g | INTRAVENOUS | Status: AC
  Administered 2022-03-20: 2 g via INTRAVENOUS

## 2022-03-20 MED ORDER — PROPOFOL 10 MG/ML IV BOLUS
INTRAVENOUS | Status: AC
  Filled 2022-03-20: qty 20

## 2022-03-20 MED ORDER — KETOROLAC TROMETHAMINE 30 MG/ML IJ SOLN
30.0000 mg | Freq: Once | INTRAMUSCULAR | Status: AC
  Administered 2022-03-20: 30 mg via INTRAVENOUS

## 2022-03-20 MED ORDER — LACTATED RINGERS IV SOLN: INTRAVENOUS | Status: DC

## 2022-03-20 MED ORDER — FENTANYL CITRATE (PF) 100 MCG/2ML IJ SOLN
25.0000 ug | INTRAMUSCULAR | Status: DC | PRN
  Administered 2022-03-20 (×2): 25 ug via INTRAVENOUS

## 2022-03-20 MED ORDER — BUPIVACAINE HCL (PF) 0.5 % IJ SOLN
INTRAMUSCULAR | Status: DC | PRN
  Administered 2022-03-20: 15 mL via PERINEURAL

## 2022-03-20 MED ORDER — ROCURONIUM BROMIDE 100 MG/10ML IV SOLN
INTRAVENOUS | Status: DC | PRN
  Administered 2022-03-20: 40 mg via INTRAVENOUS

## 2022-03-20 MED ORDER — SODIUM CHLORIDE 0.9 % IR SOLN
Status: DC | PRN
  Administered 2022-03-20: 9000 mL

## 2022-03-20 MED ORDER — OXYCODONE HCL 5 MG PO TABS: 5.0000 mg | ORAL_TABLET | Freq: Once | ORAL | Status: DC | PRN

## 2022-03-20 MED ORDER — PHENYLEPHRINE HCL-NACL 20-0.9 MG/250ML-% IV SOLN
INTRAVENOUS | Status: DC | PRN
  Administered 2022-03-20: 50 ug/min via INTRAVENOUS

## 2022-03-20 MED ORDER — GABAPENTIN 300 MG PO CAPS: 300.0000 mg | ORAL_CAPSULE | Freq: Three times a day (TID) | ORAL | 0 refills | Status: DC

## 2022-03-20 MED ORDER — ACETAMINOPHEN 10 MG/ML IV SOLN: 1000.0000 mg | Freq: Once | INTRAVENOUS | Status: DC | PRN

## 2022-03-20 MED ORDER — BUPIVACAINE LIPOSOME 1.3 % IJ SUSP
INTRAMUSCULAR | Status: DC | PRN
  Administered 2022-03-20: 10 mL via PERINEURAL

## 2022-03-20 MED ORDER — MIDAZOLAM HCL 2 MG/2ML IJ SOLN
INTRAMUSCULAR | Status: AC
  Filled 2022-03-20: qty 2

## 2022-03-20 MED ORDER — TRANEXAMIC ACID-NACL 1000-0.7 MG/100ML-% IV SOLN
1000.0000 mg | INTRAVENOUS | Status: AC
  Administered 2022-03-20: 1000 mg via INTRAVENOUS

## 2022-03-20 MED ORDER — BUPIVACAINE-EPINEPHRINE (PF) 0.5% -1:200000 IJ SOLN
INTRAMUSCULAR | Status: AC
  Filled 2022-03-20: qty 30

## 2022-03-20 MED ORDER — ACETAMINOPHEN 500 MG PO TABS
ORAL_TABLET | ORAL | Status: AC
  Filled 2022-03-20: qty 2

## 2022-03-20 MED ORDER — LIDOCAINE HCL (CARDIAC) PF 100 MG/5ML IV SOSY
PREFILLED_SYRINGE | INTRAVENOUS | Status: DC | PRN
  Administered 2022-03-20: 40 mg via INTRAVENOUS

## 2022-03-20 MED ORDER — PROMETHAZINE HCL 25 MG/ML IJ SOLN: 6.2500 mg | INTRAMUSCULAR | Status: DC | PRN

## 2022-03-20 MED ORDER — ACETAMINOPHEN 500 MG PO TABS
1000.0000 mg | ORAL_TABLET | Freq: Once | ORAL | Status: AC
  Administered 2022-03-20: 1000 mg via ORAL

## 2022-03-20 SURGICAL SUPPLY — 105 items
ANCH SUT 2 FBRTK KNTLS 1.8 (Anchor) ×6 IMPLANT
ANCHOR SUT 1.8 FIBERTAK SB KL (Anchor) IMPLANT
BLADE EXCALIBUR 4.0X13 (MISCELLANEOUS) IMPLANT
BLADE SHAVER BONE 5.0X13 (MISCELLANEOUS) IMPLANT
BLADE SHAVER TORPEDO 4X13 (MISCELLANEOUS) ×1 IMPLANT
BLADE SURG 15 STRL LF DISP TIS (BLADE) IMPLANT
BLADE SURG 15 STRL SS (BLADE) ×1
BURR OVAL 8 FLU 4.0X13 (MISCELLANEOUS) IMPLANT
BURR OVAL 8 FLU 5.0X13 (MISCELLANEOUS) IMPLANT
CANNULA 5.75X71 LONG (CANNULA) IMPLANT
CANNULA 8.25X9 (CANNULA) IMPLANT
CANNULA TWIST IN 8.25X7CM (CANNULA) IMPLANT
COOLER ICEMAN CLASSIC (MISCELLANEOUS) ×1 IMPLANT
DISSECTOR  3.8MM X 13CM (MISCELLANEOUS)
DISSECTOR 3.8MM X 13CM (MISCELLANEOUS) IMPLANT
DISSECTOR 4.0MM X 13CM (MISCELLANEOUS) IMPLANT
DRAPE IMP U-DRAPE 54X76 (DRAPES) ×1 IMPLANT
DRAPE INCISE IOBAN 66X45 STRL (DRAPES) ×1 IMPLANT
DRAPE STERI 35X30 U-POUCH (DRAPES) ×1 IMPLANT
DRAPE U-SHAPE 47X51 STRL (DRAPES) ×2 IMPLANT
DRAPE U-SHAPE 76X120 STRL (DRAPES) ×2 IMPLANT
DRSG TEGADERM 4X4.75 (GAUZE/BANDAGES/DRESSINGS) ×2 IMPLANT
DURAPREP 26ML APPLICATOR (WOUND CARE) ×1 IMPLANT
DW OUTFLOW CASSETTE/TUBE SET (MISCELLANEOUS) ×1 IMPLANT
EXCALIBUR 3.8MM X 13CM (MISCELLANEOUS) IMPLANT
GAUZE 4X4 16PLY ~~LOC~~+RFID DBL (SPONGE) IMPLANT
GAUZE PAD ABD 8X10 STRL (GAUZE/BANDAGES/DRESSINGS) IMPLANT
GAUZE SPONGE 4X4 12PLY STRL (GAUZE/BANDAGES/DRESSINGS) ×1 IMPLANT
GAUZE XEROFORM 1X8 LF (GAUZE/BANDAGES/DRESSINGS) ×1 IMPLANT
GLOVE BIO SURGEON STRL SZ 6.5 (GLOVE) IMPLANT
GLOVE BIO SURGEON STRL SZ7 (GLOVE) ×1 IMPLANT
GLOVE BIO SURGEON STRL SZ8 (GLOVE) ×1 IMPLANT
GLOVE BIOGEL PI IND STRL 6.5 (GLOVE) IMPLANT
GLOVE BIOGEL PI IND STRL 7.0 (GLOVE) ×1 IMPLANT
GLOVE BIOGEL PI IND STRL 7.5 (GLOVE) IMPLANT
GLOVE BIOGEL PI IND STRL 8 (GLOVE) ×1 IMPLANT
GLOVE SURG SS PI 7.5 STRL IVOR (GLOVE) IMPLANT
GLOVE SURG SYN 7.0 (GLOVE) ×1 IMPLANT
GLOVE SURG SYN 7.0 PF PI (GLOVE) IMPLANT
GOWN STRL REUS W/ TWL LRG LVL3 (GOWN DISPOSABLE) ×2 IMPLANT
GOWN STRL REUS W/ TWL XL LVL3 (GOWN DISPOSABLE) IMPLANT
GOWN STRL REUS W/TWL LRG LVL3 (GOWN DISPOSABLE) ×4
GOWN STRL REUS W/TWL XL LVL3 (GOWN DISPOSABLE) ×1
KIT CVD SPEAR FBRTK 1.8 DRILL (KITS) IMPLANT
KIT PERC INSERT 3.0 KNTLS (KITS) IMPLANT
KIT PUSHLOCK 2.9 HIP (KITS) IMPLANT
KIT STR SPEAR 1.8 FBRTK DISP (KITS) IMPLANT
LASSO 90 CVE QUICKPAS (DISPOSABLE) IMPLANT
LASSO CRESCENT QUICKPASS (SUTURE) IMPLANT
MANIFOLD NEPTUNE II (INSTRUMENTS) ×1 IMPLANT
NDL SAFETY ECLIP 18X1.5 (MISCELLANEOUS) ×1 IMPLANT
NDL SPNL 18GX3.5 QUINCKE PK (NEEDLE) IMPLANT
NDL SUT 2-0 SCORPION KNEE (NEEDLE) IMPLANT
NEEDLE SPNL 18GX3.5 QUINCKE PK (NEEDLE) ×1 IMPLANT
NEEDLE SUT 2-0 SCORPION KNEE (NEEDLE) IMPLANT
NS IRRIG 1000ML POUR BTL (IV SOLUTION) IMPLANT
PACK ARTHROSCOPY DSU (CUSTOM PROCEDURE TRAY) ×1 IMPLANT
PACK BASIN DAY SURGERY FS (CUSTOM PROCEDURE TRAY) ×1 IMPLANT
PAD COLD SHLDR WRAP-ON (PAD) ×1 IMPLANT
PENCIL SMOKE EVACUATOR (MISCELLANEOUS) IMPLANT
PORT APPOLLO RF 90DEGREE MULTI (SURGICAL WAND) IMPLANT
SHEET MEDIUM DRAPE 40X70 STRL (DRAPES) IMPLANT
SLEEVE ARM SUSPENSION SYSTEM (MISCELLANEOUS) ×1 IMPLANT
SLEEVE SCD COMPRESS KNEE MED (STOCKING) ×1 IMPLANT
SLING ARM FOAM STRAP LRG (SOFTGOODS) IMPLANT
SLING S3 LATERAL DISP (MISCELLANEOUS) ×1 IMPLANT
SLING ULTRA II MEDIUM (SOFTGOODS) IMPLANT
SPIKE FLUID TRANSFER (MISCELLANEOUS) IMPLANT
SPONGE SURGIFOAM ABS GEL 12-7 (HEMOSTASIS) IMPLANT
SPONGE T-LAP 4X18 ~~LOC~~+RFID (SPONGE) IMPLANT
STRIP CLOSURE SKIN 1/2X4 (GAUZE/BANDAGES/DRESSINGS) IMPLANT
SUT 0 FIBERLOOP 38 BLUE TPR ND (SUTURE)
SUT BONE WAX W31G (SUTURE) IMPLANT
SUT ETHILON 3 0 PS 1 (SUTURE) ×1 IMPLANT
SUT ETHILON 4 0 PS 2 18 (SUTURE) IMPLANT
SUT FIBERWIRE #2 38 T-5 BLUE (SUTURE)
SUT FIBERWIRE 2-0 18 17.9 3/8 (SUTURE)
SUT MNCRL AB 3-0 PS2 18 (SUTURE) IMPLANT
SUT PDS AB 0 CT 36 (SUTURE) IMPLANT
SUT VIC AB 0 CT1 27 (SUTURE) ×1
SUT VIC AB 0 CT1 27XBRD ANBCTR (SUTURE) IMPLANT
SUT VIC AB 1 CT1 27 (SUTURE) ×3
SUT VIC AB 1 CT1 27XBRD ANBCTR (SUTURE) IMPLANT
SUT VIC AB 2-0 CT1 27 (SUTURE) ×1
SUT VIC AB 2-0 CT1 TAPERPNT 27 (SUTURE) IMPLANT
SUT VIC AB 2-0 SH 27 (SUTURE) ×1
SUT VIC AB 2-0 SH 27XBRD (SUTURE) IMPLANT
SUT VIC AB 3-0 SH 27 (SUTURE) ×1
SUT VIC AB 3-0 SH 27X BRD (SUTURE) IMPLANT
SUT VICRYL 0 UR6 27IN ABS (SUTURE) IMPLANT
SUT VICRYL 4-0 PS2 18IN ABS (SUTURE) IMPLANT
SUTURE 0 FIBERLP 38 BLU TPR ND (SUTURE) IMPLANT
SUTURE FIBERWR #2 38 T-5 BLUE (SUTURE) IMPLANT
SUTURE FIBERWR 2-0 18 17.9 3/8 (SUTURE) IMPLANT
SYR 50ML LL SCALE MARK (SYRINGE) IMPLANT
SYR 5ML LL (SYRINGE) IMPLANT
SYR BULB EAR ULCER 3OZ GRN STR (SYRINGE) IMPLANT
SYS FBRTK BUTTON 2.6 (Anchor) ×1 IMPLANT
SYSTEM FBRTK BUTTON 2.6 (Anchor) IMPLANT
TAPE LABRALWHITE 1.5X36 (TAPE) IMPLANT
TAPE SUT LABRALTAP WHT/BLK (SUTURE) IMPLANT
TOWEL GREEN STERILE FF (TOWEL DISPOSABLE) ×1 IMPLANT
TUBE CONNECTING 20X1/4 (TUBING) ×1 IMPLANT
TUBING ARTHROSCOPY IRRIG 16FT (MISCELLANEOUS) ×1 IMPLANT
WATER STERILE IRR 1000ML POUR (IV SOLUTION) ×1 IMPLANT

## 2022-03-20 NOTE — H&P (Signed)
Nancy Freeman is an 52 y.o. female.   Chief Complaint: right shoulder pain HPI: Nancy Freeman is a 52 y.o. female who presents with right shoulder pain since July.  She had an injury while she was moving furniture.  This was primarily a posteriorly directed force on her right shoulder with her arm forward flexed over 90 degrees.  She has tried an injection which did not help.  She has also tried home exercise program.  Also used a sling for several weeks.  Took meloxicam as well.  Pain does wake her from sleep at night at times.  She reports new popping and clicking in the right shoulder.  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  She has had an MRI scan which is reviewed.  She does have a displaced tear of the posterior and superior labrum with moderate degenerative AC joint arthropathy and minimal glenohumeral spurring.  Rotator cuff intact..   Past Medical History:  Diagnosis Date   Allergy    Anginal pain ([place])    Normal TTE and myocardial perfusion scan 04/24/20   Anxiety    BRCA negative 08/2019   MyRisk neg   Family history of breast cancer 08/2019   IBIS=14.3%/riskscore=14.9%   Family history of ovarian cancer    GERD (gastroesophageal reflux disease)    Headache    migraines  1-2x/month   Hidradenoma VULVAR   Hypertension    IBS (irritable bowel syndrome)    Internal hemorrhoid     Past Surgical History:  Procedure Laterality Date   ABDOMINAL HYSTERECTOMY  01-19-2001  DR LOMAX   W/ LEFT SALPINGO-OOPHORECTOMY AND EXCISION ENDOMETRIOSIS   BREAST BIOPSY Left 09/22/2017   affirm bx, pash   CESAREAN SECTION     COLONOSCOPY     COLONOSCOPY WITH PROPOFOL N/A 05/10/2020   Procedure: COLONOSCOPY WITH PROPOFOL;  Surgeon: Lucilla Lame, MD;  Location: [facility];  Service: Endoscopy;  Laterality: N/A;   ESOPHAGOGASTRODUODENOSCOPY (EGD) WITH PROPOFOL N/A 05/10/2020   Procedure: ESOPHAGOGASTRODUODENOSCOPY (EGD) WITH PROPOFOL;  Surgeon: Lucilla Lame, MD;  Location: [facility];  Service: Endoscopy;  Laterality: N/A;   HEMORRHOID BANDING  2018   POLYPECTOMY N/A 05/10/2020   Procedure: POLYPECTOMY;  Surgeon: Lucilla Lame, MD;  Location: [facility];  Service: Endoscopy;  Laterality: N/A;   REMOVAL OF SIDE PLATE, DISTAL FIBULA, RIGHT ANKLE  05-08-2004   UPPER GASTROINTESTINAL ENDOSCOPY      Family History  Problem Relation Age of Onset   Ovarian cancer Mother    Uterine cancer Mother    Breast cancer Mother 23   Diabetes Father    Kidney disease Paternal Grandmother    Breast cancer Maternal Aunt 60   Esophageal cancer Neg Hx    Colon cancer Neg Hx    Stomach cancer Neg Hx    Pancreatic cancer Neg Hx    Liver disease Neg Hx    Rectal cancer Neg Hx    Social History:  reports that she has never smoked. She has never used smokeless tobacco. She reports that she does not drink alcohol and does not use drugs.  Allergies:  Allergies  Allergen Reactions   Sulfa Antibiotics Hives and Itching    Medications Prior to Admission  Medication Sig Dispense Refill   famotidine (PEPCID) 20 MG tablet Take 20 mg by mouth daily as needed for heartburn or indigestion.     hydrochlorothiazide (HYDRODIURIL) 12.5 MG tablet Take 12.5 mg by mouth daily.     melatonin 3  MG TABS tablet Take 3 mg by mouth at bedtime.     SUMAtriptan (IMITREX) 50 MG tablet Take 50 mg by mouth every 2 (two) hours as needed for migraine. May repeat in 2 hours if headache persists or recurs.     VICTOZA 18 MG/3ML SOPN Inject 1.2 mg into the skin daily.     vitamin B-12 (CYANOCOBALAMIN) 100 MCG tablet Take 100 mcg by mouth daily.     diclofenac (VOLTAREN) 75 MG EC tablet Take 1 tablet (75 mg total) by mouth 2 (two) times daily. (Patient not taking: Reported on 02/11/2022) 50 tablet 2   meloxicam (MOBIC) 15 MG tablet Take 1 tablet (15 mg total) by mouth daily. (Patient not taking: Reported on 02/11/2022) 30 tablet 0   nitroGLYCERIN (NITROSTAT) 0.4 MG SL tablet Place 0.4 mg under the tongue  every 5 (five) minutes as needed.     Sod Picosulfate-Mag Ox-Cit Acd (CLENPIQ) 10-3.5-12 MG-GM -GM/160ML SOLN Take 320 mLs by mouth as directed. 320 mL 0    Results for orders placed or performed during the hospital encounter of 03/20/22 (from the past 48 hour(s))  CBC     Status: None   Collection Time: 03/18/22  8:20 AM  Result Value Ref Range   WBC 4.3 4.0 - 10.5 K/uL   RBC 4.47 3.87 - 5.11 MIL/uL   Hemoglobin 13.3 12.0 - 15.0 g/dL   HCT 38.7 36.0 - 46.0 %   MCV 86.6 80.0 - 100.0 fL   MCH 29.8 26.0 - 34.0 pg   MCHC 34.4 30.0 - 36.0 g/dL   RDW 11.9 11.5 - 15.5 %   Platelets 174 150 - 400 K/uL   nRBC 0.0 0.0 - 0.2 %    Comment: Performed at [facility], [place] [address], [place], [place] [address]  Basic metabolic panel     Status: Abnormal   Collection Time: 03/18/22  8:20 AM  Result Value Ref Range   Sodium 138 135 - 145 mmol/L   Potassium 3.2 (L) 3.5 - 5.1 mmol/L   Chloride 102 98 - 111 mmol/L   CO2 29 22 - 32 mmol/L   Glucose, Bld 96 70 - 99 mg/dL    Comment: Glucose reference range applies only to samples taken after fasting for at least 8 hours.   BUN 15 6 - 20 mg/dL   Creatinine, Ser 0.93 0.44 - 1.00 mg/dL   Calcium 8.7 (L) 8.9 - 10.3 mg/dL   GFR, Estimated >60 >60 mL/min    Comment: (NOTE) Calculated using the CKD-EPI Creatinine Equation (2021)    Anion gap 7 5 - 15    Comment: Performed at [facility] [address], [place], [place] [address]   No results found.  Review of Systems  Musculoskeletal:  Positive for arthralgias.  All other systems reviewed and are negative.   Blood pressure (!) 141/90, pulse 60, temperature 97.9 F (36.6 C), temperature source Oral, resp. rate 20, height '5\' 6"'$  (1.676 m), weight 86.5 kg, SpO2 96 %. Physical Exam Vitals reviewed.  HENT:     Head: Normocephalic.     Nose: Nose normal.     Mouth/Throat:     Mouth: Mucous membranes are moist.  Eyes:     Pupils: Pupils are equal, round, and reactive to light.   Cardiovascular:     Rate and Rhythm: Normal rate.     Pulses: Normal pulses.  Pulmonary:     Effort: Pulmonary effort is normal.  Abdominal:  General: Abdomen is flat.  Musculoskeletal:     Cervical back: Normal range of motion.  Skin:    General: Skin is warm.     Capillary Refill: Capillary refill takes less than 2 seconds.  Neurological:     General: No focal deficit present.     Mental Status: She is alert.  Psychiatric:        Mood and Affect: Mood normal.     Ortho exam demonstrates range of motion on the right of 65/100/170.  She does have increased posterior laxity of 2+ out of 3 with anterior laxity slightly less than 1 out of 3 with less than a centimeter sulcus sign.  O'Brien's testing is positive on the right negative on the left.  Speeds testing positive on the right negative on the left.  Patient does have tenderness to palpation of the [facility] joint along with tenderness to crossarm adduction on the right-hand side.  Positive apprehension and relocation testing for posterior instability on the right as well which is not present on the left  Assessment/Plan  Impression is right shoulder pain with posterior laxity on exam and AC joint tenderness on exam.  This is consistent with her MRI findings.  She has failed conservative management.  Symptoms are debilitating and severe.  Does not look like she has any frozen shoulder or rotator cuff pathology.  Plan at this time would be right shoulder arthroscopy with posterior labral repair and possible biceps tenodesis and distal clavicle excision.  At the time of her last clinic visit Jacklyn did describe relief from the [facility] joint injection which has recurred slightly due to lifting a box.  This positive response in addition to the edema present on MRI scan would indicate that that is at least a partial pain generator in her current constellation of symptoms and we will plan to address it with distal clavicle excision at the time of surgery.   Risk and benefits of the surgery are discussed including not limited to infection or vessel damage incomplete restoration of function as well as the relatively prolonged rehabilitative effort required.  Patient understands risk benefits and wishes to proceed.  All questions answered   Anderson Malta, MD 03/20/2022, 7:16 AM

## 2022-03-20 NOTE — Anesthesia Procedure Notes (Signed)
Procedure Name: Intubation Date/Time: 03/20/2022 8:00 AM  Performed by: Verita Lamb, CRNAPre-anesthesia Checklist: Patient identified, Emergency Drugs available, Suction available and Patient being monitored Patient Re-evaluated:Patient Re-evaluated prior to induction Oxygen Delivery Method: Circle system utilized Preoxygenation: Pre-oxygenation with 100% oxygen Induction Type: IV induction Ventilation: Mask ventilation without difficulty Laryngoscope Size: Mac and 4 Grade View: Grade I Tube type: Oral Tube size: 7.0 mm Number of attempts: 1 Airway Equipment and Method: Stylet and Oral airway Placement Confirmation: ETT inserted through vocal cords under direct vision, positive ETCO2, breath sounds checked- equal and bilateral and CO2 detector Secured at: 22 cm Tube secured with: Tape Dental Injury: Teeth and Oropharynx as per pre-operative assessment

## 2022-03-20 NOTE — Anesthesia Procedure Notes (Signed)
Anesthesia Regional Block: Interscalene brachial plexus block   Pre-Anesthetic Checklist: , timeout performed,  Correct Patient, Correct Site, Correct Laterality,  Correct Procedure, Correct Position, site marked,  Risks and benefits discussed,  Surgical consent,  Pre-op evaluation,  At surgeon's request and post-op pain management  Laterality: Right  Prep: chloraprep       Needles:  Injection technique: Single-shot  Needle Type: Echogenic Stimulator Needle     Needle Length: 9cm  Needle Gauge: 21     Additional Needles:   Procedures:,,,, ultrasound used (permanent image in chart),,    Narrative:  Start time: 03/20/2022 7:05 AM End time: 03/20/2022 7:15 AM Injection made incrementally with aspirations every 5 mL.  Performed by: Personally  Anesthesiologist: Murvin Natal, MD  Additional Notes: Functioning IV was confirmed and monitors were applied.  A timeout was performed. Sterile prep, hand hygiene and sterile gloves were used. A 5m 21ga Arrow echogenic stimulator needle was used. Negative aspiration and negative test dose prior to incremental administration of local anesthetic. The patient tolerated the procedure well.  Ultrasound guidance: relevent anatomy identified, needle position confirmed, local anesthetic spread visualized around nerve(s), vascular puncture avoided.  Image printed for medical record.

## 2022-03-20 NOTE — Anesthesia Postprocedure Evaluation (Signed)
Anesthesia Post Note  Patient: Nancy Freeman  Procedure(s) Performed: RIGHT SHOULDER ARTHROSCOPY, POSTERIOR LABRAL REPAIR, DEBRIDEMENT, ARTHROSCOPIC DISTAL CLAVICLE EXCISION, (Right: Shoulder) OPEN BICEPS TENODESIS (Right: Shoulder)     Patient location during evaluation: PACU Anesthesia Type: Regional and General Level of consciousness: awake Pain management: pain level controlled Vital Signs Assessment: post-procedure vital signs reviewed and stable Respiratory status: spontaneous breathing, nonlabored ventilation and respiratory function stable Cardiovascular status: blood pressure returned to baseline and stable Postop Assessment: no apparent nausea or vomiting Anesthetic complications: no   No notable events documented.  Last Vitals:  Vitals:   03/20/22 1230 03/20/22 1330  BP: 117/77 122/76  Pulse: 66 63  Resp: 15 16  Temp:  36.7 C  SpO2: 94% 94%    Last Pain:  Vitals:   03/20/22 1330  TempSrc:   PainSc: 1                  Zohar Laing P Aneyah Lortz

## 2022-03-20 NOTE — Transfer of Care (Signed)
Immediate Anesthesia Transfer of Care Note  Patient: RYLAH FUKUDA  Procedure(s) Performed: RIGHT SHOULDER ARTHROSCOPY, POSTERIOR LABRAL REPAIR, DEBRIDEMENT, ARTHROSCOPIC DISTAL CLAVICLE EXCISION, BICEPS TENODESIS (Right: Shoulder)  Patient Location: PACU  Anesthesia Type:General and Regional  Level of Consciousness: awake and patient cooperative  Airway & Oxygen Therapy: Patient Spontanous Breathing and Patient connected to face mask oxygen  Post-op Assessment: Report given to RN and Post -op Vital signs reviewed and stable  Post vital signs: Reviewed and stable  Last Vitals:  Vitals Value Taken Time  BP    Temp    Pulse 81 03/20/22 1115  Resp 17 03/20/22 1115  SpO2 98 % 03/20/22 1115  Vitals shown include unvalidated device data.  Last Pain:  Vitals:   03/20/22 0634  TempSrc: Oral  PainSc: 0-No pain      Patients Stated Pain Goal: 3 (58/48/35 0757)  Complications: No notable events documented.

## 2022-03-20 NOTE — Discharge Instructions (Addendum)
No Tylenol before 1:00pm if needed. No ibuprofen before 6:00pm if needed.  Post Anesthesia Home Care Instructions  Activity: Get plenty of rest for the remainder of the day. A responsible individual must stay with you for 24 hours following the procedure.  For the next 24 hours, DO NOT: -Drive a car -Paediatric nurse -Drink alcoholic beverages -Take any medication unless instructed by your physician -Make any legal decisions or sign important papers.  Meals: Start with liquid foods such as gelatin or soup. Progress to regular foods as tolerated. Avoid greasy, spicy, heavy foods. If nausea and/or vomiting occur, drink only clear liquids until the nausea and/or vomiting subsides. Call your physician if vomiting continues.  Special Instructions/Symptoms: Your throat may feel dry or sore from the anesthesia or the breathing tube placed in your throat during surgery. If this causes discomfort, gargle with warm salt water. The discomfort should disappear within 24 hours.   Regional Anesthesia Blocks  1. Numbness or the inability to move the "blocked" extremity may last from 3-48 hours after placement. The length of time depends on the medication injected and your individual response to the medication. If the numbness is not going away after 48 hours, call your surgeon.  2. The extremity that is blocked will need to be protected until the numbness is gone and the  Strength has returned. Because you cannot feel it, you will need to take extra care to avoid injury. Because it may be weak, you may have difficulty moving it or using it. You may not know what position it is in without looking at it while the block is in effect.  3. For blocks in the legs and feet, returning to weight bearing and walking needs to be done carefully. You will need to wait until the numbness is entirely gone and the strength has returned. You should be able to move your leg and foot normally before you try and bear  weight or walk. You will need someone to be with you when you first try to ensure you do not fall and possibly risk injury.  4. Bruising and tenderness at the needle site are common side effects and will resolve in a few days.  5. Persistent numbness or new problems with movement should be communicated to the surgeon or the [place] [phone] [facility] [phone]).    Information for Discharge Teaching: EXPAREL (bupivacaine liposome injectable suspension)   Your surgeon or anesthesiologist gave you EXPAREL(bupivacaine) to help control your pain after surgery.  EXPAREL is a local anesthetic that provides pain relief by numbing the tissue around the surgical site. EXPAREL is designed to release pain medication over time and can control pain for up to 72 hours. Depending on how you respond to EXPAREL, you may require less pain medication during your recovery.  Possible side effects: Temporary loss of sensation or ability to move in the area where bupivacaine was injected. Nausea, vomiting, constipation Rarely, numbness and tingling in your mouth or lips, lightheadedness, or anxiety may occur. Call your doctor right away if you think you may be experiencing any of these sensations, or if you have other questions regarding possible side effects.  Follow all other discharge instructions given to you by your surgeon or nurse. Eat a healthy diet and drink plenty of water or other fluids.  If you return to the hospital for any reason within 96 hours following the administration of EXPAREL, it is important for health care providers to know  that you have received this anesthetic. A teal colored band has been placed on your arm with the date, time and amount of EXPAREL you have received in order to alert and inform your health care providers. Please leave this armband in place for the full 96 hours following administration, and then you may remove the band.

## 2022-03-20 NOTE — Anesthesia Preprocedure Evaluation (Addendum)
Anesthesia Evaluation  Patient identified by MRN, date of birth, ID band Patient awake    Reviewed: Allergy & Precautions, NPO status , Patient's Chart, lab work & pertinent test results  Airway Mallampati: III  TM Distance: >3 FB Neck ROM: Full    Dental no notable dental hx.    Pulmonary neg pulmonary ROS   Pulmonary exam normal        Cardiovascular hypertension, Pt. on medications (-) CAD Normal cardiovascular exam     Neuro/Psych  Headaches  Anxiety        GI/Hepatic Neg liver ROS,GERD  Medicated and Controlled,,  Endo/Other  negative endocrine ROS    Renal/GU negative Renal ROS     Musculoskeletal   Abdominal  (+) + obese  Peds  Hematology negative hematology ROS (+)   Anesthesia Other Findings right shoulder posterior labral tear, slap tear, acromioclavicular osteoarthritits  Reproductive/Obstetrics                             Anesthesia Physical Anesthesia Plan  ASA: 3  Anesthesia Plan: General and Regional   Post-op Pain Management: Regional block*   Induction: Intravenous  PONV Risk Score and Plan: 3 and Ondansetron, Dexamethasone, Midazolam and Treatment may vary due to age or medical condition  Airway Management Planned: Oral ETT  Additional Equipment:   Intra-op Plan:   Post-operative Plan: Extubation in OR  Informed Consent: I have reviewed the patients History and Physical, chart, labs and discussed the procedure including the risks, benefits and alternatives for the proposed anesthesia with the patient or authorized representative who has indicated his/her understanding and acceptance.     Dental advisory given  Plan Discussed with: CRNA  Anesthesia Plan Comments:        Anesthesia Quick Evaluation

## 2022-03-20 NOTE — Progress Notes (Signed)
Assisted Dr. Ellender with right, interscalene , ultrasound guided block. Side rails up, monitors on throughout procedure. See vital signs in flow sheet. Tolerated Procedure well. ?

## 2022-03-21 NOTE — Brief Op Note (Signed)
   03/20/2022  10:27 AM  PATIENT:  Yetta Glassman  52 y.o. female  PRE-OPERATIVE DIAGNOSIS:  right shoulder posterior labral tear, slap tear, acromioclavicular osteoarthritits  POST-OPERATIVE DIAGNOSIS:  right shoulder posterior labral tear, slap tear, acromioclavicular osteoarthritits  PROCEDURE:  Procedure(s): RIGHT SHOULDER ARTHROSCOPY, POSTERIOR LABRAL REPAIR, DEBRIDEMENT, ARTHROSCOPIC DISTAL CLAVICLE EXCISION, OPEN BICEPS TENODESIS  SURGEON:  Surgeon(s): Marlou Sa, Tonna Corner, MD  ASSISTANT: Annie Main, PA  ANESTHESIA:   General  EBL: 15 cc ml    Total I/O In: 1120 [P.O.:120; I.V.:1000] Out: 45 [Blood:50]  BLOOD ADMINISTERED: none  DRAINS: None  LOCAL MEDICATIONS USED:  none  SPECIMEN:  No Specimen  COUNTS:  YES  TOURNIQUET:  * No tourniquets in log *  DICTATION: .Other Dictation: Dictation Number pending  PLAN OF CARE: Discharge to home after PACU  PATIENT DISPOSITION:  PACU - hemodynamically stable

## 2022-03-22 DIAGNOSIS — M7521 Bicipital tendinitis, right shoulder: Secondary | ICD-10-CM

## 2022-03-22 DIAGNOSIS — S43431A Superior glenoid labrum lesion of right shoulder, initial encounter: Secondary | ICD-10-CM

## 2022-03-22 DIAGNOSIS — M19011 Primary osteoarthritis, right shoulder: Secondary | ICD-10-CM

## 2022-03-23 ENCOUNTER — Encounter (HOSPITAL_BASED_OUTPATIENT_CLINIC_OR_DEPARTMENT_OTHER): Payer: Self-pay | Admitting: Orthopedic Surgery

## 2022-03-24 NOTE — Op Note (Signed)
Nancy Freeman, Nancy Freeman MEDICAL RECORD NO: [id number] ACCOUNT NO: [id number] DATE OF BIRTH: 1970-03-29 FACILITY: [facility] LOCATION: [facility] PHYSICIAN: Yetta Barre. Marlou Sa, MD  Operative Report   PREOPERATIVE DIAGNOSES:  Right shoulder posterior instability and labral tear with SLAP tear and AC joint arthritis.  POSTOPERATIVE DIAGNOSES:  Right shoulder posterior instability and labral tear with SLAP tear and AC joint arthritis.  PROCEDURE:  Right shoulder arthroscopy with posterior labral repair and subsequent debridement with arthroscopic distal clavicle excision, biceps tendon release and mini open biceps tenodesis.  SURGEON:  Yetta Barre. Marlou Sa, MD  ASSISTANT:  Annie Main.  INDICATIONS:  The patient is a 52 year old patient with right shoulder pain following an injury several months ago.  She did have an MRI scan, which shows posterior labral tear and she does have posterior instability on exam.  She also has AC joint  tenderness, which was released significantly with AC joint injection and there was corresponding bone marrow edema and arthritis in that [facility] joint.  She presents now for operative management  PROCEDURE IN DETAIL:  The patient was brought to the operating room where general anesthetic was induced.  Preoperative antibiotics administered.  Timeout was called.  Right shoulder was prescrubbed with alcohol and Betadine, allowed to air dry, prepped  with ChloraPrep solution and draped in sterile manner. Operative examination under anesthesia demonstrated 2+ posterior laxity on the right compared to 1+ on the left.  There was no anterior or inferior instability on the right or left hand side.   Following examination under anesthesia, the patient was sterilely prepped and draped.  Ioban used to seal the operative field.  The patient was placed in lateral position with left axilla and left peroneal nerve well padded.  With 15 pounds of traction  and a laterally directed force, portal was  created posteriorly.  Anterior portal created under direct visualization.  Diagnostic arthroscopy was performed.  The patient did have a posterior labral tear extending from the 7 o'clock to 11 o'clock position.   This also extended anteriorly to the biceps anchor.  The anterior inferior glenohumeral ligaments were intact.  Glenohumeral articular surfaces intact and the rotator cuff was also intact.  At this time, portal reversal was performed.  A percutaneous  portal was created.  The labral glenoid interface was debrided with a rasp and 4 anchors were placed, which were knotless SutureTaks.  Soft tissue was reinforced to the posterior glenoid not more than 5 mm away from the glenoid articular surface. This  gave a very nice stabilizing repair.  At this time, an anchor was placed at the base of the posterior labrum at the base of the biceps as well as anteriorly at the biceps.  The biceps tendon was then released.  The remaining labrum was stabilized with  the anchors.  Next, the scope was placed into the subacromial space.  Lateral portal was created.  A subacromial bursectomy was performed.  The clavicle was visualized, ____ distal end.  Arthrocare wand used to expose the distal 9 mm of the clavicle.   Then, with an anterior portal, a 4 mm bur was used to resect the distal end of the clavicle, maintaining the superior and posterior ligaments.  Following this, the instruments were removed from the portals.  Portals were then closed using 2-0 Vicryl, 3-0  nylon.  Small incision was then made off the anterolateral aspect of the acromion.  The deltoid was split and measured distance of 4 cm from that anterolateral margin of the  acromion.  Bursectomy completed and the biceps tendon was then tenodesed into  the bicipital groove using 2 knotless Arthrex SutureTaks.  Stay suture was placed between the middle and anterior raphae of the deltoid which was #1 Vicryl suture prior to biceps tenodesis. The biceps tendon  was under appropriate tension and oversewn  using 3-0 Vicryl sutures.  Thorough irrigation then performed and deltoid was closed using #1 Vicryl suture followed by interrupted inverted 2-0 Vicryl suture and 3-0 Monocryl with Steri-Strips and impervious dressings applied.  Luke's assistance was  required at all times for retraction, opening, closing, mobilization of tissue.  His assistance was a medical necessity.  She was placed in a sling and transferred to recovery room in stable condition.   NIK D: 03/24/2022 3:58:10 pm T: 03/24/2022 6:57:00 pm  JOB: [id number]/ [id number]

## 2022-03-26 ENCOUNTER — Encounter: Payer: Self-pay | Admitting: Orthopedic Surgery

## 2022-03-26 ENCOUNTER — Ambulatory Visit (INDEPENDENT_AMBULATORY_CARE_PROVIDER_SITE_OTHER): Payer: BC Managed Care – PPO | Admitting: Surgical

## 2022-03-26 DIAGNOSIS — Z9889 Other specified postprocedural states: Secondary | ICD-10-CM

## 2022-03-26 NOTE — Progress Notes (Signed)
Post-Op Visit Note   Patient: Nancy Freeman           Date of Birth: December 20, 1970           MRN: [id number] Visit Date: 03/26/2022 PCP: Rutherford Limerick, PA   Assessment & Plan:  Chief Complaint:  Chief Complaint  Patient presents with   Right Shoulder - Routine Post Op    R SHOULDER SCOPE (surgery date 03-20-22) 03/20/22 (6d) Right Shoulder Arthroscopy, Posterior Labral Repair, Debridement, Arthroscopic Distal Clavicle Excision, - Right  Open Biceps Tenodesis      Visit Diagnoses: No diagnosis found.  Plan: Patient is a 52 year old female who presents s/p right shoulder arthroscopy with posterior labral repair, debridement, arthroscopic distal clavicle excision, mini open bicep tenodesis on 03/20/2022.  She states that she does not really have a lot of pain and just notes some "discomfort" in the right shoulder.  She denies any fevers, chills, drainage from the incision.  No lingering chest pain or shortness of breath.  She has noticed a little bit of clicking when she was riding here in the car today but aside from that really no complaints.  Aside from the shoulder, she does note some clicking sensation in her left jaw when she opens and closes her mouth at times that is new for her that she did not have prior to surgery.  Not painful for her and not super alarming but she is curious about it.  On exam, patient has no severe stiffness of the right shoulder with passive motion.  Shoulder was not stressed today due to proximity to the time of surgery.  She has incisions that are healing well without evidence of infection or dehiscence.  Sutures removed and replaced with Steri-Strips today.  She has intact EPL, FPL, finger abduction, finger adduction, grip strength testing, pronation/supination, bicep, tricep, deltoid.  Axillary nerve is intact with deltoid firing.  She has 2+ radial pulse of the operative extremity.  Plan is to stay in sling at all times except for showering.  No range of  motion of the operative shoulder.  No lifting with the operative arm.  Follow-up in 2 weeks for clinical recheck with Dr. Marlou Sa.  The left jaw clicking will likely improve as she gets further out from surgery but we will keep an eye on this.  Follow-Up Instructions: No follow-ups on file.   Orders:  No orders of the defined types were placed in this encounter.  No orders of the defined types were placed in this encounter.   Imaging: No results found.  PMFS History: Patient Active Problem List   Diagnosis Date Noted   Arthritis of right acromioclavicular joint 03/22/2022   Degenerative superior labral anterior-to-posterior (SLAP) tear of right shoulder 03/22/2022   Biceps tendonitis on right 03/22/2022   Tear of right glenoid labrum 03/22/2022   Chronic right shoulder pain 01/19/2022   Personal history of colonic polyps    Polyp of sigmoid colon    Gastroesophageal reflux disease without esophagitis    BRCA negative 10/13/2019   Family history of ovarian cancer 09/18/2019   Prolapsed internal hemorrhoids, grade 2 05/19/2016   Past Medical History:  Diagnosis Date   Allergy    Anginal pain ([place])    Normal TTE and myocardial perfusion scan 04/24/20   Anxiety    BRCA negative 08/2019   MyRisk neg   Family history of breast cancer 08/2019   IBIS=14.3%/riskscore=14.9%   Family history of ovarian cancer  GERD (gastroesophageal reflux disease)    Headache    migraines  1-2x/month   Hidradenoma VULVAR   Hypertension    IBS (irritable bowel syndrome)    Internal hemorrhoid     Family History  Problem Relation Age of Onset   Ovarian cancer Mother    Uterine cancer Mother    Breast cancer Mother 62   Diabetes Father    Kidney disease Paternal Grandmother    Breast cancer Maternal Aunt 31   Esophageal cancer Neg Hx    Colon cancer Neg Hx    Stomach cancer Neg Hx    Pancreatic cancer Neg Hx    Liver disease Neg Hx    Rectal cancer Neg Hx     Past Surgical History:   Procedure Laterality Date   ABDOMINAL HYSTERECTOMY  01-19-2001  DR Ubaldo Glassing   W/ LEFT SALPINGO-OOPHORECTOMY AND EXCISION ENDOMETRIOSIS   BICEPT TENODESIS Right 03/20/2022   Procedure: OPEN BICEPS TENODESIS;  Surgeon: Meredith Pel, MD;  Location: [place];  Service: Orthopedics;  Laterality: Right;   BREAST BIOPSY Left 09/22/2017   affirm bx, pash   CESAREAN SECTION     COLONOSCOPY     COLONOSCOPY WITH PROPOFOL N/A 05/10/2020   Procedure: COLONOSCOPY WITH PROPOFOL;  Surgeon: Lucilla Lame, MD;  Location: [place];  Service: Endoscopy;  Laterality: N/A;   ESOPHAGOGASTRODUODENOSCOPY (EGD) WITH PROPOFOL N/A 05/10/2020   Procedure: ESOPHAGOGASTRODUODENOSCOPY (EGD) WITH PROPOFOL;  Surgeon: Lucilla Lame, MD;  Location: [place];  Service: Endoscopy;  Laterality: N/A;   HEMORRHOID BANDING  2018   POLYPECTOMY N/A 05/10/2020   Procedure: POLYPECTOMY;  Surgeon: Lucilla Lame, MD;  Location: [place];  Service: Endoscopy;  Laterality: N/A;   REMOVAL OF SIDE PLATE, DISTAL FIBULA, RIGHT ANKLE  05-08-2004   SHOULDER ARTHROSCOPY WITH OPEN ROTATOR CUFF REPAIR AND DISTAL CLAVICLE ACROMINECTOMY Right 03/20/2022   Procedure: RIGHT SHOULDER ARTHROSCOPY, POSTERIOR LABRAL REPAIR, DEBRIDEMENT, ARTHROSCOPIC DISTAL CLAVICLE EXCISION,;  Surgeon: Meredith Pel, MD;  Location: [place];  Service: Orthopedics;  Laterality: Right;   UPPER GASTROINTESTINAL ENDOSCOPY     Social History   Occupational History   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

## 2022-03-27 ENCOUNTER — Encounter: Payer: BC Managed Care – PPO | Admitting: Surgical

## 2022-04-10 ENCOUNTER — Ambulatory Visit (INDEPENDENT_AMBULATORY_CARE_PROVIDER_SITE_OTHER): Payer: BC Managed Care – PPO | Admitting: Orthopedic Surgery

## 2022-04-10 DIAGNOSIS — Z9889 Other specified postprocedural states: Secondary | ICD-10-CM

## 2022-04-10 NOTE — Progress Notes (Unsigned)
Post-Op Visit Note   Patient: Nancy Freeman           Date of Birth: 18-Jan-1971           MRN: [id number] Visit Date: 04/10/2022 PCP: Nancy Limerick, PA   Assessment & Plan:  Chief Complaint:  Chief Complaint  Patient presents with   Right Shoulder - Routine Post Op     03/20/22     Right Shoulder Arthroscopy, Posterior Labral Repair, Debridement, Arthroscopic Distal Clavicle Excision, - Right        Open Biceps Tenodesis      Visit Diagnoses:  1. Status post labral repair of shoulder     Plan: Nancy Freeman is a patient who is now 3 weeks out right shoulder arthroscopy with posterior labral repair debridement distal clavicle excision and open biceps tenodesis.  Still having some difficulty sleeping.  Taking Tylenol and Robaxin which helps some.  On examination she has excellent posterior restraint to capsular motion.  Her range of motion is generally pretty good in terms of forward flexion abduction.  All incisions are well-healed.  Plan at this time is to start physical therapy but I do want her to continue the sling for 3 more weeks.  Focusing primarily on not stretching out that posterior capsule and keeping the hand pointing forward.  Okay for forward flexion external rotation and abduction with no resistive biceps.  We will start rotator cuff strengthening in 3 weeks.  3-week return for clinical recheck.  Follow-Up Instructions: No follow-ups on file.   Orders:  No orders of the defined types were placed in this encounter.  No orders of the defined types were placed in this encounter.   Imaging: No results found.  PMFS History: Patient Active Problem List   Diagnosis Date Noted   Arthritis of right acromioclavicular joint 03/22/2022   Degenerative superior labral anterior-to-posterior (SLAP) tear of right shoulder 03/22/2022   Biceps tendonitis on right 03/22/2022   Tear of right glenoid labrum 03/22/2022   Chronic right shoulder pain 01/19/2022   Personal history of  colonic polyps    Polyp of sigmoid colon    Gastroesophageal reflux disease without esophagitis    BRCA negative 10/13/2019   Family history of ovarian cancer 09/18/2019   Prolapsed internal hemorrhoids, grade 2 05/19/2016   Past Medical History:  Diagnosis Date   Allergy    Anginal pain ([facility])    Normal TTE and myocardial perfusion scan 04/24/20   Anxiety    BRCA negative 08/2019   MyRisk neg   Family history of breast cancer 08/2019   IBIS=14.3%/riskscore=14.9%   Family history of ovarian cancer    GERD (gastroesophageal reflux disease)    Headache    migraines  1-2x/month   Hidradenoma VULVAR   Hypertension    IBS (irritable bowel syndrome)    Internal hemorrhoid     Family History  Problem Relation Age of Onset   Ovarian cancer Mother    Uterine cancer Mother    Breast cancer Mother 49   Diabetes Father    Kidney disease Paternal Grandmother    Breast cancer Maternal Aunt 62   Esophageal cancer Neg Hx    Colon cancer Neg Hx    Stomach cancer Neg Hx    Pancreatic cancer Neg Hx    Liver disease Neg Hx    Rectal cancer Neg Hx     Past Surgical History:  Procedure Laterality Date   ABDOMINAL HYSTERECTOMY  01-19-2001  DR Ubaldo Glassing  W/ LEFT SALPINGO-OOPHORECTOMY AND EXCISION ENDOMETRIOSIS   BICEPT TENODESIS Right 03/20/2022   Procedure: OPEN BICEPS TENODESIS;  Surgeon: Meredith Pel, MD;  Location: [place];  Service: Orthopedics;  Laterality: Right;   BREAST BIOPSY Left 09/22/2017   affirm bx, pash   CESAREAN SECTION     COLONOSCOPY     COLONOSCOPY WITH PROPOFOL N/A 05/10/2020   Procedure: COLONOSCOPY WITH PROPOFOL;  Surgeon: Lucilla Lame, MD;  Location: [place];  Service: Endoscopy;  Laterality: N/A;   ESOPHAGOGASTRODUODENOSCOPY (EGD) WITH PROPOFOL N/A 05/10/2020   Procedure: ESOPHAGOGASTRODUODENOSCOPY (EGD) WITH PROPOFOL;  Surgeon: Lucilla Lame, MD;  Location: [place];  Service: Endoscopy;  Laterality: N/A;   HEMORRHOID  BANDING  2018   POLYPECTOMY N/A 05/10/2020   Procedure: POLYPECTOMY;  Surgeon: Lucilla Lame, MD;  Location: [place];  Service: Endoscopy;  Laterality: N/A;   REMOVAL OF SIDE PLATE, DISTAL FIBULA, RIGHT ANKLE  05-08-2004   SHOULDER ARTHROSCOPY WITH OPEN ROTATOR CUFF REPAIR AND DISTAL CLAVICLE ACROMINECTOMY Right 03/20/2022   Procedure: RIGHT SHOULDER ARTHROSCOPY, POSTERIOR LABRAL REPAIR, DEBRIDEMENT, ARTHROSCOPIC DISTAL CLAVICLE EXCISION,;  Surgeon: Meredith Pel, MD;  Location: [place];  Service: Orthopedics;  Laterality: Right;   UPPER GASTROINTESTINAL ENDOSCOPY     Social History   Occupational History   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

## 2022-04-11 ENCOUNTER — Encounter: Payer: Self-pay | Admitting: Orthopedic Surgery

## 2022-05-08 ENCOUNTER — Ambulatory Visit (INDEPENDENT_AMBULATORY_CARE_PROVIDER_SITE_OTHER): Payer: BC Managed Care – PPO | Admitting: Surgical

## 2022-05-08 DIAGNOSIS — Z9889 Other specified postprocedural states: Secondary | ICD-10-CM

## 2022-05-10 ENCOUNTER — Encounter: Payer: Self-pay | Admitting: Surgical

## 2022-05-10 NOTE — Progress Notes (Signed)
Post-Op Visit Note   Patient: Nancy Freeman           Date of Birth: 1970-08-24           MRN: [id number] Visit Date: 05/08/2022 PCP: Rutherford Limerick, PA   Assessment & Plan:  Chief Complaint:  Chief Complaint  Patient presents with   Right Shoulder - Pain   Visit Diagnoses:  1. Status post labral repair of shoulder     Plan: Patient is a 52 year old female who presents s/p right shoulder arthroscopy with posterior labral repair, arthroscopic distal clavicle excision and open biceps tenodesis on 03/20/2022.  She reports that she is doing well overall.  She feels like she is progressing with physical therapy.  She really does not have any significant complaints at this time aside from stiffness in the shoulder.  She notes particular that she has difficulty with abduction and externally rotating her right shoulder.  It is difficult to do her hair.  She is sleeping okay at night.  On exam, patient has 30 degrees X rotation, 60 degrees abduction, 90 degrees forward elevation.  She has incisions that are well-healed.  Axillary nerve intact with deltoid firing.  She has no Popeye deformity noted.  2+ radial pulse of the operative extremity.  Intact EPL, FPL, finger abduction, grip strength testing, pronation/supination, bicep, tricep, deltoid.  She has decent rotator cuff strength rated 5/5 of supra, infra, subscap.  Plan is to continue with shoulder range of motion exercises in physical therapy.  Okay to discontinue sling.  Okay to start rotator cuff strengthening exercises, plan to send message to physical therapy.  Will see her back in 6 weeks.  Follow-Up Instructions: No follow-ups on file.   Orders:  No orders of the defined types were placed in this encounter.  No orders of the defined types were placed in this encounter.   Imaging: No results found.  PMFS History: Patient Active Problem List   Diagnosis Date Noted   Arthritis of right acromioclavicular joint 03/22/2022    Degenerative superior labral anterior-to-posterior (SLAP) tear of right shoulder 03/22/2022   Biceps tendonitis on right 03/22/2022   Tear of right glenoid labrum 03/22/2022   Chronic right shoulder pain 01/19/2022   Personal history of colonic polyps    Polyp of sigmoid colon    Gastroesophageal reflux disease without esophagitis    BRCA negative 10/13/2019   Family history of ovarian cancer 09/18/2019   Prolapsed internal hemorrhoids, grade 2 05/19/2016   Past Medical History:  Diagnosis Date   Allergy    Anginal pain ([facility])    Normal TTE and myocardial perfusion scan 04/24/20   Anxiety    BRCA negative 08/2019   MyRisk neg   Family history of breast cancer 08/2019   IBIS=14.3%/riskscore=14.9%   Family history of ovarian cancer    GERD (gastroesophageal reflux disease)    Headache    migraines  1-2x/month   Hidradenoma VULVAR   Hypertension    IBS (irritable bowel syndrome)    Internal hemorrhoid     Family History  Problem Relation Age of Onset   Ovarian cancer Mother    Uterine cancer Mother    Breast cancer Mother 6   Diabetes Father    Kidney disease Paternal Grandmother    Breast cancer Maternal Aunt 45   Esophageal cancer Neg Hx    Colon cancer Neg Hx    Stomach cancer Neg Hx    Pancreatic cancer Neg Hx  Liver disease Neg Hx    Rectal cancer Neg Hx     Past Surgical History:  Procedure Laterality Date   ABDOMINAL HYSTERECTOMY  01-19-2001  DR LOMAX   W/ LEFT SALPINGO-OOPHORECTOMY AND EXCISION ENDOMETRIOSIS   BICEPT TENODESIS Right 03/20/2022   Procedure: OPEN BICEPS TENODESIS;  Surgeon: Meredith Pel, MD;  Location: [place];  Service: Orthopedics;  Laterality: Right;   BREAST BIOPSY Left 09/22/2017   affirm bx, pash   CESAREAN SECTION     COLONOSCOPY     COLONOSCOPY WITH PROPOFOL N/A 05/10/2020   Procedure: COLONOSCOPY WITH PROPOFOL;  Surgeon: Lucilla Lame, MD;  Location: [place];  Service: Endoscopy;  Laterality: N/A;    ESOPHAGOGASTRODUODENOSCOPY (EGD) WITH PROPOFOL N/A 05/10/2020   Procedure: ESOPHAGOGASTRODUODENOSCOPY (EGD) WITH PROPOFOL;  Surgeon: Lucilla Lame, MD;  Location: [place];  Service: Endoscopy;  Laterality: N/A;   HEMORRHOID BANDING  2018   POLYPECTOMY N/A 05/10/2020   Procedure: POLYPECTOMY;  Surgeon: Lucilla Lame, MD;  Location: [place];  Service: Endoscopy;  Laterality: N/A;   REMOVAL OF SIDE PLATE, DISTAL FIBULA, RIGHT ANKLE  05-08-2004   SHOULDER ARTHROSCOPY WITH OPEN ROTATOR CUFF REPAIR AND DISTAL CLAVICLE ACROMINECTOMY Right 03/20/2022   Procedure: RIGHT SHOULDER ARTHROSCOPY, POSTERIOR LABRAL REPAIR, DEBRIDEMENT, ARTHROSCOPIC DISTAL CLAVICLE EXCISION,;  Surgeon: Meredith Pel, MD;  Location: [place];  Service: Orthopedics;  Laterality: Right;   UPPER GASTROINTESTINAL ENDOSCOPY     Social History   Occupational History   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

## 2022-06-19 ENCOUNTER — Other Ambulatory Visit: Payer: Self-pay

## 2022-06-19 ENCOUNTER — Ambulatory Visit (INDEPENDENT_AMBULATORY_CARE_PROVIDER_SITE_OTHER): Payer: BC Managed Care – PPO | Admitting: Orthopedic Surgery

## 2022-06-19 ENCOUNTER — Encounter: Payer: Self-pay | Admitting: Orthopedic Surgery

## 2022-06-19 DIAGNOSIS — Z9889 Other specified postprocedural states: Secondary | ICD-10-CM | POA: Diagnosis not present

## 2022-06-19 DIAGNOSIS — M25511 Pain in right shoulder: Secondary | ICD-10-CM | POA: Diagnosis not present

## 2022-06-19 MED ORDER — BUPIVACAINE HCL 0.5 % IJ SOLN
9.0000 mL | INTRAMUSCULAR | Status: AC | PRN
  Administered 2022-06-19: 9 mL via INTRA_ARTICULAR

## 2022-06-19 MED ORDER — METHYLPREDNISOLONE ACETATE 40 MG/ML IJ SUSP
40.0000 mg | INTRAMUSCULAR | Status: AC | PRN
  Administered 2022-06-19: 40 mg via INTRA_ARTICULAR

## 2022-06-19 MED ORDER — LIDOCAINE HCL 1 % IJ SOLN
5.0000 mL | INTRAMUSCULAR | Status: AC | PRN
  Administered 2022-06-19: 5 mL

## 2022-06-19 NOTE — Progress Notes (Signed)
Post-Op Visit Note Nancy Freeman is now about 3 months out right shoulder arthroscopy with posterior labral repair distal clavicle excision and biceps tenodesis.  Does physical therapy 2 times a week plus home exercise program.  Reports some swelling in the sternoclavicular joint occasionally.  Is having continued deltoid discomfort.  Therapy thinks her range of motion is lacking.  She can sleep some on the right-hand side.  Doing massage Therapy for subscap release.  On examination range of motion is 20/50/80.  Cuff strength is excellent and shoulder is stable.  Plan at this time is ultrasound-guided injection into the glenohumeral joint with continued physical therapy and home exercise program to focus solely on achieving better range of motion.  6-week return for clinical recheck and decision for or against repeat injection and/or manipulation.  Patient: Nancy Freeman           Date of Birth: Feb 28, 1970      Procedure Note  Patient: Nancy Freeman             Date of Birth: [birth year]           MRN: [id number]             Visit Date: 06/19/2022  Procedures: Visit Diagnoses:  1. Status post labral repair of shoulder     Large Joint Inj: R glenohumeral on 06/19/2022 6:18 PM Indications: diagnostic evaluation and pain Details: 18 G 1.5 in needle, ultrasound-guided posterior approach  Arthrogram: No  Medications: 9 mL bupivacaine 0.5 %; 40 mg methylPREDNISolone acetate 40 MG/ML; 5 mL lidocaine 1 % Outcome: tolerated well, no immediate complications Procedure, treatment alternatives, risks and benefits explained, specific risks discussed. Consent was given by the patient. Immediately prior to procedure a time out was called to verify the correct patient, procedure, equipment, support staff and site/side marked as required. Patient was prepped and draped in the usual sterile fashion.               MRN: [id number] Visit Date: 06/19/2022 PCP: Gildardo Pounds, PA   Assessment &  Plan:  Chief Complaint:  Chief Complaint  Patient presents with   Right Shoulder - Follow-up   Visit Diagnoses:  1. Status post labral repair of shoulder     Plan: See above  Follow-Up Instructions: No follow-ups on file.   Orders:  Orders Placed This Encounter  Procedures   US Guided Needle Placement - No Linked Charges   No orders of the defined types were placed in this encounter.   Imaging: No results found.  PMFS History: Patient Active Problem List   Diagnosis Date Noted   Arthritis of right acromioclavicular joint 03/22/2022   Degenerative superior labral anterior-to-posterior (SLAP) tear of right shoulder 03/22/2022   Biceps tendonitis on right 03/22/2022   Tear of right glenoid labrum 03/22/2022   Chronic right shoulder pain 01/19/2022   Personal history of colonic polyps    Polyp of sigmoid colon    Gastroesophageal reflux disease without esophagitis    BRCA negative 10/13/2019   Family history of ovarian cancer 09/18/2019   Prolapsed internal hemorrhoids, grade 2 05/19/2016   Past Medical History:  Diagnosis Date   Allergy    Anginal pain ([facility])    Normal TTE and myocardial perfusion scan 04/24/20   Anxiety    BRCA negative 08/2019   MyRisk neg   Family history of breast cancer 08/2019   IBIS=14.3%/riskscore=14.9%   Family history of ovarian cancer    GERD (gastroesophageal reflux disease)  Headache    migraines  1-2x/month   Hidradenoma VULVAR   Hypertension    IBS (irritable bowel syndrome)    Internal hemorrhoid     Family History  Problem Relation Age of Onset   Ovarian cancer Mother    Uterine cancer Mother    Breast cancer Mother 47   Diabetes Father    Kidney disease Paternal Grandmother    Breast cancer Maternal Aunt 17   Esophageal cancer Neg Hx    Colon cancer Neg Hx    Stomach cancer Neg Hx    Pancreatic cancer Neg Hx    Liver disease Neg Hx    Rectal cancer Neg Hx     Past Surgical History:  Procedure Laterality Date    ABDOMINAL HYSTERECTOMY  01-19-2001  DR Nicholas Lose   W/ LEFT SALPINGO-OOPHORECTOMY AND EXCISION ENDOMETRIOSIS   BICEPT TENODESIS Right 03/20/2022   Procedure: OPEN BICEPS TENODESIS;  Surgeon: Cammy Copa, MD;  Location: [facility];  Service: Orthopedics;  Laterality: Right;   BREAST BIOPSY Left 09/22/2017   affirm bx, pash   CESAREAN SECTION     COLONOSCOPY     COLONOSCOPY WITH PROPOFOL N/A 05/10/2020   Procedure: COLONOSCOPY WITH PROPOFOL;  Surgeon: Midge Minium, MD;  Location: [facility];  Service: Endoscopy;  Laterality: N/A;   ESOPHAGOGASTRODUODENOSCOPY (EGD) WITH PROPOFOL N/A 05/10/2020   Procedure: ESOPHAGOGASTRODUODENOSCOPY (EGD) WITH PROPOFOL;  Surgeon: Midge Minium, MD;  Location[facility];  Service: Endoscopy;  Laterality: N/A;   HEMORRHOID BANDING  2018   POLYPECTOMY N/A 05/10/2020   Procedure: POLYPECTOMY;  Surgeon: Midge Minium, MD;  Location: [facility];  Service: Endoscopy;  Laterality: N/A;   REMOVAL OF SIDE PLATE, DISTAL FIBULA, RIGHT ANKLE  05-08-2004   SHOULDER ARTHROSCOPY WITH OPEN ROTATOR CUFF REPAIR AND DISTAL CLAVICLE ACROMINECTOMY Right 03/20/2022   Procedure: RIGHT SHOULDER ARTHROSCOPY, POSTERIOR LABRAL REPAIR, DEBRIDEMENT, ARTHROSCOPIC DISTAL CLAVICLE EXCISION,;  Surgeon: Cammy Copa, MD;  Location: [facility];  Service: Orthopedics;  Laterality: Right;   UPPER GASTROINTESTINAL ENDOSCOPY     Social History   Occupational History   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

## 2022-07-10 ENCOUNTER — Ambulatory Visit (INDEPENDENT_AMBULATORY_CARE_PROVIDER_SITE_OTHER): Payer: BC Managed Care – PPO | Admitting: Surgical

## 2022-07-10 ENCOUNTER — Encounter: Payer: Self-pay | Admitting: Surgical

## 2022-07-10 DIAGNOSIS — Z9889 Other specified postprocedural states: Secondary | ICD-10-CM

## 2022-07-10 DIAGNOSIS — M25611 Stiffness of right shoulder, not elsewhere classified: Secondary | ICD-10-CM

## 2022-07-10 DIAGNOSIS — M7501 Adhesive capsulitis of right shoulder: Secondary | ICD-10-CM

## 2022-07-12 ENCOUNTER — Encounter: Payer: Self-pay | Admitting: Surgical

## 2022-07-12 NOTE — Progress Notes (Signed)
Follow-up Office Visit Note   Patient: Nancy Freeman           Date of Birth: December 12, 1970           MRN: [id number] Visit Date: 07/10/2022 Requested by: Gildardo Pounds, PA [address] [place],  [place] [address] PCP: Gildardo Pounds, PA  Subjective: Chief Complaint  Patient presents with   Right Shoulder - Follow-up    Right shoulder scope 03/20/2022    HPI: Nancy Freeman is a 52 y.o. female who returns to the office for follow-up visit.    Plan at last visit was: Plan at this time is ultrasound-guided injection into the glenohumeral joint with continued physical therapy and home exercise program to focus solely on achieving better range of motion. 6-week return for clinical recheck and decision for or against repeat injection and/or manipulation.         Since then, patient notes she has been going to physical therapy 2 times per week with home exercise program on the other days.  She states that her therapist has told her that she needs imaging as the shoulder is more stiff than she thinks is appropriate.  She feels that she has not made any significant progress since the last visit with Dr. August Saucer and she has plateaued.  She also has intermittent swelling on the right side of her neck in the region of the sternocleidomastoid.  Not really any swelling near the shoulder itself.  She denies any association with any foods or activities that she has been able to notice.  Currently not swollen today.  She denies any history of thyroid disease or cancer.              ROS: All systems reviewed are negative as they relate to the chief complaint within the history of present illness.  Patient denies fevers or chills.  Assessment & Plan: Visit Diagnoses:  1. Shoulder stiffness, right   2. Status post labral repair of shoulder     Plan: Nancy Freeman is a 52 y.o. female who returns to the office for follow-up visit for right frozen shoulder postoperatively.  Plan from last visit was noted  above in HPI.  They now return with mild improvement in passive motion of the right shoulder relative to her last visit with Dr. August Saucer on 06/19/2022.  However, from a functional standpoint she still feels that she has not really made significant gains and she feels that her progress has plateaued in the last week or 2.  Patient desires to proceed with manipulation under anesthesia as was discussed with Dr. August Saucer.  We did discuss this again as far as what it entails.  We discussed the risks and benefits of the procedure including but not limited to the risk of damage to intra-articular structures, fracture, continued stiffness of the shoulder, medical complication from surgical procedure/anesthesia, infection.  With her continued stiffness with external rotation, she will need manipulation under anesthesia followed by shoulder arthroscopy with rotator interval release.  Will get her set up for CPM machine to use after surgery along with working with her physical therapist.  Follow-up after procedure.  Follow-Up Instructions: No follow-ups on file.   Orders:  No orders of the defined types were placed in this encounter.  No orders of the defined types were placed in this encounter.     Procedures: No procedures performed   Clinical Data: No additional findings.  Objective: Vital Signs: There were no  vitals taken for this visit.  Physical Exam:  Constitutional: Patient appears well-developed HEENT:  Head: Normocephalic Eyes:EOM are normal Neck: Normal range of motion Cardiovascular: Normal rate Pulmonary/chest: Effort normal Neurologic: Patient is alert Skin: Skin is warm Psychiatric: Patient has normal mood and affect  Ortho Exam: Ortho exam demonstrates right shoulder with 30 degrees external rotation, 60 degrees abduction, 95 degrees forward elevation passively and actively.  She has pain with passive motion of the shoulder especially at the ends of range of motion.  Does have some mild  tenderness over the [facility] joint that continues.  Visions are well-healed.  Axillary nerve intact with deltoid firing.  2+ radial pulse of the operative extremity.  She has no palpable lymph nodes noted throughout the neck, jaw, clavicle region.  There is no asymmetric swelling or any thyroid nodules palpable.  Specialty Comments:  No specialty comments available.  Imaging: No results found.   PMFS History: Patient Active Problem List   Diagnosis Date Noted   Arthritis of right acromioclavicular joint 03/22/2022   Degenerative superior labral anterior-to-posterior (SLAP) tear of right shoulder 03/22/2022   Biceps tendonitis on right 03/22/2022   Tear of right glenoid labrum 03/22/2022   Chronic right shoulder pain 01/19/2022   Personal history of colonic polyps    Polyp of sigmoid colon    Gastroesophageal reflux disease without esophagitis    BRCA negative 10/13/2019   Family history of ovarian cancer 09/18/2019   Prolapsed internal hemorrhoids, grade 2 05/19/2016   Past Medical History:  Diagnosis Date   Allergy    Anginal pain (HCC)    Normal TTE and myocardial perfusion scan 04/24/20   Anxiety    BRCA negative 08/2019   MyRisk neg   Family history of breast cancer 08/2019   IBIS=14.3%/riskscore=14.9%   Family history of ovarian cancer    GERD (gastroesophageal reflux disease)    Headache    migraines  1-2x/month   Hidradenoma VULVAR   Hypertension    IBS (irritable bowel syndrome)    Internal hemorrhoid     Family History  Problem Relation Age of Onset   Ovarian cancer Mother    Uterine cancer Mother    Breast cancer Mother 71   Diabetes Father    Kidney disease Paternal Grandmother    Breast cancer Maternal Aunt 26   Esophageal cancer Neg Hx    Colon cancer Neg Hx    Stomach cancer Neg Hx    Pancreatic cancer Neg Hx    Liver disease Neg Hx    Rectal cancer Neg Hx     Past Surgical History:  Procedure Laterality Date   ABDOMINAL HYSTERECTOMY  01-19-2001  DR  Nicholas Lose   W/ LEFT SALPINGO-OOPHORECTOMY AND EXCISION ENDOMETRIOSIS   BICEPT TENODESIS Right 03/20/2022   Procedure: OPEN BICEPS TENODESIS;  Surgeon: Cammy Copa, MD;  Location: [facility];  Service: Orthopedics;  Laterality: Right;   BREAST BIOPSY Left 09/22/2017   affirm bx, pash   CESAREAN SECTION     COLONOSCOPY     COLONOSCOPY WITH PROPOFOL N/A 05/10/2020   Procedure: COLONOSCOPY WITH PROPOFOL;  Surgeon: Midge Minium, MD;  Location[facility];  Service: Endoscopy;  Laterality: N/A;   ESOPHAGOGASTRODUODENOSCOPY (EGD) WITH PROPOFOL N/A 05/10/2020   Procedure: ESOPHAGOGASTRODUODENOSCOPY (EGD) WITH PROPOFOL;  Surgeon: Midge Minium, MD;  Location: [facility];  Service: Endoscopy;  Laterality: N/A;   HEMORRHOID BANDING  2018   POLYPECTOMY N/A 05/10/2020   Procedure: POLYPECTOMY;  Surgeon: Midge Minium,  MD;  Location: [facility];  Service: Endoscopy;  Laterality: N/A;   REMOVAL OF SIDE PLATE, DISTAL FIBULA, RIGHT ANKLE  05-08-2004   SHOULDER ARTHROSCOPY WITH OPEN ROTATOR CUFF REPAIR AND DISTAL CLAVICLE ACROMINECTOMY Right 03/20/2022   Procedure: RIGHT SHOULDER ARTHROSCOPY, POSTERIOR LABRAL REPAIR, DEBRIDEMENT, ARTHROSCOPIC DISTAL CLAVICLE EXCISION,;  Surgeon: Cammy Copa, MD;  Location: [facility];  Service: Orthopedics;  Laterality: Right;   UPPER GASTROINTESTINAL ENDOSCOPY     Social History   Occupational History   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

## 2022-07-15 ENCOUNTER — Other Ambulatory Visit: Payer: Self-pay | Admitting: Physician Assistant

## 2022-07-15 DIAGNOSIS — Z1231 Encounter for screening mammogram for malignant neoplasm of breast: Secondary | ICD-10-CM

## 2022-07-16 DIAGNOSIS — R221 Localized swelling, mass and lump, neck: Secondary | ICD-10-CM

## 2022-07-22 NOTE — Telephone Encounter (Signed)
Nancy Freeman, can you order an MRI of her neck region to evaluate for swelling in the anterior neck in the region of the sternocleidomastoid on the right side?  Does not need MRI cervical spine, I think there is an MRI for just the anterior part of the neck and the region of the thyroid.

## 2022-07-29 ENCOUNTER — Ambulatory Visit: Payer: BC Managed Care – PPO | Admitting: Orthopedic Surgery

## 2022-07-31 ENCOUNTER — Ambulatory Visit
Admission: RE | Admit: 2022-07-31 | Discharge: 2022-07-31 | Disposition: A | Discharge status: Home / Self Care | Payer: BC Managed Care – PPO | Source: Ambulatory Visit

## 2022-07-31 ENCOUNTER — Encounter: Payer: Self-pay | Admitting: Surgical

## 2022-07-31 ENCOUNTER — Ambulatory Visit: Payer: BC Managed Care – PPO | Admitting: Orthopedic Surgery

## 2022-07-31 DIAGNOSIS — Z1231 Encounter for screening mammogram for malignant neoplasm of breast: Secondary | ICD-10-CM

## 2022-08-02 ENCOUNTER — Ambulatory Visit
Admission: RE | Admit: 2022-08-02 | Discharge: 2022-08-02 | Disposition: A | Discharge status: Home / Self Care | Payer: BC Managed Care – PPO | Source: Ambulatory Visit | Attending: Surgical | Admitting: Surgical

## 2022-08-02 DIAGNOSIS — R221 Localized swelling, mass and lump, neck: Secondary | ICD-10-CM

## 2022-08-18 ENCOUNTER — Telehealth: Payer: Self-pay | Admitting: Orthopedic Surgery

## 2022-08-18 NOTE — Telephone Encounter (Signed)
Left message on voice mail. Asked patient to return call.  Provided name and direct number.  Surgery sheet from 07-10-22 unable to reach patient.

## 2022-08-20 ENCOUNTER — Telehealth: Payer: Self-pay | Admitting: Orthopedic Surgery

## 2022-08-20 NOTE — Telephone Encounter (Signed)
Patient is scheduled at [facility] 09-03-22 @ 2:15pm or right shoulder arthroscopy with rotator interval release, manipulation under anesthesia.  July 2nd was offered for surgery, but patient declined stating anything earlier is too soon and too close to the July 4th holiday.  Patient is asking for a callback to go over the MRI results  of the  neck region. It was ordered to evaluate for swelling in the anterior neck in the region of the sternocleidomastoid on the right side.  Patient was holding off on scheduling the surgery until she had gone over the results with [facility] or Dr. August Saucer.  Please call patient today.  [phone]

## 2022-08-20 NOTE — Telephone Encounter (Signed)
I called.  I think she is having increased forces go through the sternoclavicular joint because her shoulder is stiff.  We discussed how structurally the that area does not have any kind of mass there.  We can see if that gets better with improved ball-and-socket range of motion.

## 2022-08-25 ENCOUNTER — Encounter (HOSPITAL_COMMUNITY)
Admission: RE | Admit: 2022-08-25 | Discharge: 2022-08-25 | Disposition: A | Discharge status: Home / Self Care | Payer: BC Managed Care – PPO | Source: Ambulatory Visit | Attending: Orthopedic Surgery | Admitting: Orthopedic Surgery

## 2022-08-25 ENCOUNTER — Encounter (HOSPITAL_COMMUNITY): Payer: Self-pay

## 2022-08-25 ENCOUNTER — Other Ambulatory Visit: Payer: Self-pay

## 2022-08-25 DIAGNOSIS — I10 Essential (primary) hypertension: Secondary | ICD-10-CM | POA: Diagnosis not present

## 2022-08-25 DIAGNOSIS — Z01812 Encounter for preprocedural laboratory examination: Secondary | ICD-10-CM | POA: Diagnosis present

## 2022-08-25 DIAGNOSIS — Z01818 Encounter for other preprocedural examination: Secondary | ICD-10-CM

## 2022-08-25 HISTORY — DX: Attention-deficit hyperactivity disorder, unspecified type: F90.9

## 2022-08-25 LAB — BASIC METABOLIC PANEL
Anion gap: 9 (ref 5–15)
BUN: 9 mg/dL (ref 6–20)
CO2: 30 mmol/L (ref 22–32)
Calcium: 9 mg/dL (ref 8.9–10.3)
Chloride: 100 mmol/L (ref 98–111)
Creatinine, Ser: 1.09 mg/dL — ABNORMAL HIGH (ref 0.44–1.00)
GFR, Estimated: >60 mL/min (ref >60)
Glucose, Bld: 96 mg/dL (ref 70–99)
Potassium: 3.3 mmol/L — ABNORMAL LOW (ref 3.5–5.1)
Sodium: 139 mmol/L (ref 135–145)

## 2022-08-25 LAB — CBC
HCT: 41.9 % (ref 36.0–46.0)
Hemoglobin: 14.4 g/dL (ref 12.0–15.0)
MCH: 29.6 pg (ref 26.0–34.0)
MCHC: 34.4 g/dL (ref 30.0–36.0)
MCV: 86.2 fL (ref 80.0–100.0)
Platelets: 214 10*3/uL (ref 150–400)
RBC: 4.86 MIL/uL (ref 3.87–5.11)
RDW: 11.5 % (ref 11.5–15.5)
WBC: 4.2 10*3/uL (ref 4.0–10.5)
nRBC: 0 % (ref 0.0–0.2)

## 2022-08-25 NOTE — Progress Notes (Signed)
Surgical Instructions   Your procedure is scheduled on Thursday, September 03, 2022 at 2:48 PM.  Report to [facility] Main Entrance "A" at 12:50 PM, then check in with the Admitting office.  Call this number if you have problems the morning of surgery:  [phone]   If you have any questions prior to your surgery date call [phone]: Open Monday-Friday 8am-4pm If you experience any cold or flu symptoms such as cough, fever, chills, shortness of breath, etc. between now and your scheduled surgery, please notify us at the above number    Remember:  Do not eat after midnight the night before your surgery  You may drink clear liquids until 11:50 AM the morning of your surgery.   Clear liquids allowed are: Water, Non-Citrus Juices (without pulp), Carbonated Beverages, Clear Tea, Black Coffee ONLY (NO MILK, CREAM OR POWDERED CREAMER of any kind), and Gatorade  Enhanced Recovery after Surgery for Orthopedics Enhanced Recovery after Surgery is a protocol used to improve the stress on your body and your recovery after surgery.  Patient Instructions  The day of surgery (if you do NOT have diabetes):  Drink ONE (1) Pre-Surgery Clear Ensure by 11:50 AM the morning of surgery   This drink was given to you during your hospital  pre-op appointment visit. Nothing else to drink after completing the  Pre-Surgery Clear Ensure.        If you have questions, please contact your surgeon's office.     Take these medicines the morning of surgery with A SIP OF WATER: Famotidine (Pepcid) - if needed, Sumatriptan (Imitrex) - if needed  As of today, STOP taking any Aspirin (unless otherwise instructed by your surgeon) Aleve, Naproxen, Ibuprofen, Motrin, Advil, Goody's, BC's, all herbal medications (includes Melatonin), fish oil, and all vitamins.  [facility] is not responsible for any belongings or valuables.     Contacts, glasses, hearing aids, dentures or partials may not be worn into surgery, please bring  cases for these belongings   Patients discharged the day of surgery will not be allowed to drive home and someone needs to stay with them for 24 hours.   SURGICAL WAITING ROOM VISITATION Patients having surgery or a procedure may have no more than 2 support people in the waiting area - these visitors may rotate.   Children under the age of 65 must have an adult with them who is not the patient. Pre-op nurse will coordinate an appropriate time for 1 support person to accompany patient in pre-op.  This support person may not rotate.   Special instructions:    Oral Hygiene is also important to reduce your risk of infection.  Remember - BRUSH YOUR TEETH THE MORNING OF SURGERY WITH YOUR REGULAR TOOTHPASTE  [facility]- Preparing For Surgery  Before surgery, you can play an important role. Because skin is not sterile, your skin needs to be as free of germs as possible. You can reduce the number of germs on your skin by washing with CHG (chlorahexidine gluconate) Soap before surgery.  CHG is an antiseptic cleaner which kills germs and bonds with the skin to continue killing germs even after washing.    Please do not use if you have an allergy to CHG or antibacterial soaps. If your skin becomes reddened/irritated stop using the CHG.  Do not shave (including legs and underarms) for at least 48 hours prior to first CHG shower. It is OK to shave your face.  Please follow these instructions carefully.  Shower the NIGHT BEFORE SURGERY and the MORNING OF SURGERY with CHG Soap.   If you chose to wash your hair, wash your hair first as usual with your normal shampoo. After you shampoo, rinse your hair and body thoroughly to remove the shampoo.  Then [employer] and genitals (private parts) with your normal soap and rinse thoroughly to remove soap.  After that Use CHG Soap as you would any other liquid soap. You can apply CHG directly to the skin and wash gently with a scrungie or a clean washcloth.    Apply the CHG Soap to your body ONLY FROM THE NECK DOWN.  Do not use on open wounds or open sores. Avoid contact with your eyes, ears, mouth and genitals (private parts). Wash Face and genitals (private parts)  with your normal soap.   Wash thoroughly, paying special attention to the area where your surgery will be performed.  Thoroughly rinse your body with warm water from the neck down.  DO NOT shower/wash with your normal soap after using and rinsing off the CHG Soap.  Pat yourself dry with a CLEAN TOWEL.  Wear CLEAN PAJAMAS to bed the night before surgery  Place CLEAN SHEETS on your bed the night before your surgery  DO NOT SLEEP WITH PETS.  Day of Surgery: Take a shower with CHG soap.  Do not wear jewelry or makeup. Do not wear lotions, powders, perfumes or deodorant. Do not shave 48 hours prior to surgery.  Do not bring valuables to the hospital. Do not wear nail polish, gel polish, artificial nails, or any other type of covering on natural nails (fingers and toes) If you have artificial nails or gel coating that need to be removed by a nail salon, please have this removed prior to surgery. Artificial nails or gel coating may interfere with anesthesia's ability to adequately monitor your vital signs.  Please read over the fact sheets that you were given.

## 2022-08-25 NOTE — Progress Notes (Signed)
PCP - Unknown Foley, PA Cardiologist - Has seen Dr. Lorretta Harp in the past  Chest x-ray - N/A EKG - 03/18/22 Stress Test - 04/24/20 ECHO - 04/24/20 Cardiac Cath - no  ERAS Protcol - yes with PRE-SURGERY Ensure    Anesthesia review: Yes, pt has been seen by cardiologist for shortness of breath and was labeled with angina (per pt) in 2022. She states that she would have shortness of breath with exertion. She was supposed to have a cardiac CT done, but her insurance would not cover it. She states Dr. Novella Olive spoke with over the phone and he said that it was not really necessary to have the CT done and told her that PCP could follow her and return to see him as needed.   Patient denies shortness of breath, fever, cough and chest pain at PAT appointment   All instructions explained to the patient, with a verbal understanding of the material. The opportunity to ask questions was provided.

## 2022-08-26 NOTE — Progress Notes (Signed)
Anesthesia Chart Review:  52 year old female with history of HTN, GERD, migraines.  Patient evaluated by cardiology at [facility] in 2022 for reported exertional dyspnea.  Echocardiogram and myocardial perfusion scan were both normal.  Coronary CTA was also ordered but patient states this was denied by her insurance.  She states she had subsequent conversation with cardiologist Dr. Novella Olive who advised that based on normal echo and stress test he felt she was okay to continue management with PCP and follow-up if with cardiology on an as-needed basis.  Patient denied any cardiopulmonary symptoms at preadmission testing appointment.  She recently underwent similar surgery on 03/20/2022 at [facility] surgery center without complication.  Preop labs reviewed, mild hypokalemia potassium 3.3, otherwise unremarkable.  EKG 03/18/2022: Sinus bradycardia.  Rate 54.  TTE 04/24/2020 (Care Everywhere): Summary   1. The left ventricle is normal in size with normal wall thickness.    2. The left ventricular systolic function is normal, LVEF is visually  estimated at > 55%.    3. The right ventricle is normal in size, with normal systolic function.    4. The aorta is upper normal in size in the visualized segments.   Nuclear stress 04/24/2020: Impressions:  - Normal myocardial perfusion study   - No evidence for significant ischemia or scar is noted.   - Post stress:  Global systolic function is normal.  The ejection fraction  calculated at 58%.   - Sensitivity and specificity of this test are reduced by the noted motion     Zannie Cove [facility] Stay [facility]/Anesthesiology Phone [phone] 08/26/2022 2:41 PM

## 2022-08-26 NOTE — Anesthesia Preprocedure Evaluation (Addendum)
Anesthesia Evaluation  Patient identified by MRN, date of birth, ID band Patient awake    Reviewed: Allergy & Precautions, NPO status , Patient's Chart, lab work & pertinent test results  History of Anesthesia Complications Negative for: history of anesthetic complications  Airway Mallampati: III  TM Distance: >3 FB Neck ROM: Full   Comment: Previous grade I view with MAC 4, easy mask Dental  (+) Dental Advisory Given   Pulmonary neg pulmonary ROS   Pulmonary exam normal breath sounds clear to auscultation       Cardiovascular hypertension (HCTZ), Pt. on medications (-) angina (-) Past MI, (-) Cardiac Stents and (-) CABG (-) dysrhythmias  Rhythm:Regular Rate:Normal     Neuro/Psych  Headaches, neg Seizures PSYCHIATRIC DISORDERS (ADHD) Anxiety        GI/Hepatic Neg liver ROS,GERD  Medicated,,IBS   Endo/Other  negative endocrine ROS    Renal/GU negative Renal ROS     Musculoskeletal  (+) Arthritis ,    Abdominal   Peds  Hematology negative hematology ROS (+)   Anesthesia Other Findings Last took [facility] 7/5. Has been on it 2.5 years. Denies abdominal bloating, nausea, and vomiting.  Reproductive/Obstetrics                             Anesthesia Physical Anesthesia Plan  ASA: 3  Anesthesia Plan: General and Regional   Post-op Pain Management: Regional block* and Tylenol PO (pre-op)*   Induction: Intravenous and Rapid sequence  PONV Risk Score and Plan: 3 and Ondansetron, Dexamethasone and Treatment may vary due to age or medical condition  Airway Management Planned: Oral ETT  Additional Equipment:   Intra-op Plan:   Post-operative Plan: Extubation in OR  Informed Consent: I have reviewed the patients History and Physical, chart, labs and discussed the procedure including the risks, benefits and alternatives for the proposed anesthesia with the patient or authorized representative  who has indicated his/her understanding and acceptance.     Dental advisory given  Plan Discussed with: CRNA and Anesthesiologist  Anesthesia Plan Comments: (Discussed potential risks of nerve blocks including, but not limited to, infection, bleeding, nerve damage, seizures, pneumothorax, respiratory depression, and potential failure of the block. Alternatives to nerve blocks discussed. All questions answered.  Risks of general anesthesia discussed including, but not limited to, sore throat, hoarse voice, chipped/damaged teeth, injury to vocal cords, nausea and vomiting, allergic reactions, lung infection, heart attack, stroke, and death. All questions answered.   PAT note by Antionette Poles, PA-C: 52 year old female with history of HTN, GERD, migraines.  Patient evaluated by cardiology at [facility] in 2022 for reported exertional dyspnea.  Echocardiogram and myocardial perfusion scan were both normal.  Coronary CTA was also ordered but patient states this was denied by her insurance.  She states she had subsequent conversation with cardiologist Dr. Novella Olive who advised that based on normal echo and stress test he felt she was okay to continue management with PCP and follow-up if with cardiology on an as-needed basis.  Patient denied any cardiopulmonary symptoms at preadmission testing appointment.  She recently underwent similar surgery on 03/20/2022 at [facility] surgery center without complication.  Preop labs reviewed, mild hypokalemia potassium 3.3, otherwise unremarkable.  EKG 03/18/2022: Sinus bradycardia.  Rate 54.  TTE 04/24/2020 (Care Everywhere): Summary  1. The left ventricle is normal in size with normal wall thickness.  2. The left ventricular systolic function is normal, LVEF is visually  estimated at >  55%.  3. The right ventricle is normal in size, with normal systolic function.  4. The aorta is upper normal in size in the visualized segments.   Nuclear stress  04/24/2020: Impressions:  - Normal myocardial perfusion study   - No evidence for significant ischemia or scar is noted.   - Post stress: Global systolic function is normal. The ejection fraction  calculated at 58%.   - Sensitivity and specificity of this test are reduced by the noted motion   )        Anesthesia Quick Evaluation

## 2022-09-02 NOTE — Progress Notes (Signed)
Patient's husband voiced understanding of new arrival time of 0900 tomorrow and clear liquids okay until 0800.

## 2022-09-03 ENCOUNTER — Ambulatory Visit (HOSPITAL_COMMUNITY): Payer: BC Managed Care – PPO | Admitting: Physician Assistant

## 2022-09-03 ENCOUNTER — Encounter (HOSPITAL_COMMUNITY)
Admission: RE | Disposition: A | Discharge status: Home / Self Care | Payer: Self-pay | Source: Home / Self Care | Attending: Orthopedic Surgery

## 2022-09-03 ENCOUNTER — Other Ambulatory Visit: Payer: Self-pay

## 2022-09-03 ENCOUNTER — Ambulatory Visit (HOSPITAL_COMMUNITY)
Admission: RE | Admit: 2022-09-03 | Discharge: 2022-09-03 | Disposition: A | Discharge status: Home / Self Care | Payer: BC Managed Care – PPO | Attending: Orthopedic Surgery | Admitting: Orthopedic Surgery

## 2022-09-03 ENCOUNTER — Ambulatory Visit (HOSPITAL_COMMUNITY): Payer: BC Managed Care – PPO | Admitting: Anesthesiology

## 2022-09-03 ENCOUNTER — Encounter (HOSPITAL_COMMUNITY): Payer: Self-pay | Admitting: Orthopedic Surgery

## 2022-09-03 DIAGNOSIS — K219 Gastro-esophageal reflux disease without esophagitis: Secondary | ICD-10-CM | POA: Insufficient documentation

## 2022-09-03 DIAGNOSIS — M7501 Adhesive capsulitis of right shoulder: Secondary | ICD-10-CM | POA: Insufficient documentation

## 2022-09-03 DIAGNOSIS — Z7722 Contact with and (suspected) exposure to environmental tobacco smoke (acute) (chronic): Secondary | ICD-10-CM | POA: Diagnosis not present

## 2022-09-03 DIAGNOSIS — I10 Essential (primary) hypertension: Secondary | ICD-10-CM | POA: Insufficient documentation

## 2022-09-03 DIAGNOSIS — Z01818 Encounter for other preprocedural examination: Secondary | ICD-10-CM

## 2022-09-03 HISTORY — PX: SHOULDER ARTHROSCOPY: SHX128

## 2022-09-03 HISTORY — PX: SHOULDER CLOSED REDUCTION: SHX1051

## 2022-09-03 SURGERY — ARTHROSCOPY, SHOULDER
Anesthesia: Regional | Site: Shoulder | Laterality: Right

## 2022-09-03 MED ORDER — PHENYLEPHRINE 80 MCG/ML (10ML) SYRINGE FOR IV PUSH (FOR BLOOD PRESSURE SUPPORT)
PREFILLED_SYRINGE | INTRAVENOUS | Status: DC | PRN
  Administered 2022-09-03: 80 ug via INTRAVENOUS

## 2022-09-03 MED ORDER — BUPIVACAINE HCL (PF) 0.5 % IJ SOLN
INTRAMUSCULAR | Status: DC | PRN
  Administered 2022-09-03: 10 mL via PERINEURAL

## 2022-09-03 MED ORDER — ACETAMINOPHEN 500 MG PO TABS: 1000.0000 mg | ORAL_TABLET | Freq: Once | ORAL | Status: AC

## 2022-09-03 MED ORDER — EPHEDRINE 5 MG/ML INJ
INTRAVENOUS | Status: AC
  Filled 2022-09-03: qty 5

## 2022-09-03 MED ORDER — ROCURONIUM BROMIDE 10 MG/ML (PF) SYRINGE
PREFILLED_SYRINGE | INTRAVENOUS | Status: AC
  Filled 2022-09-03: qty 10

## 2022-09-03 MED ORDER — ACETAMINOPHEN 500 MG PO TABS
ORAL_TABLET | ORAL | Status: AC
  Administered 2022-09-03: 1000 mg via ORAL
  Filled 2022-09-03: qty 2

## 2022-09-03 MED ORDER — AMISULPRIDE (ANTIEMETIC) 5 MG/2ML IV SOLN
INTRAVENOUS | Status: AC
  Administered 2022-09-03: 10 mg via INTRAVENOUS
  Filled 2022-09-03: qty 4

## 2022-09-03 MED ORDER — ONDANSETRON HCL 4 MG/2ML IJ SOLN
INTRAMUSCULAR | Status: AC
  Filled 2022-09-03: qty 2

## 2022-09-03 MED ORDER — ONDANSETRON HCL 4 MG/2ML IJ SOLN
4.0000 mg | Freq: Once | INTRAMUSCULAR | Status: AC
  Administered 2022-09-03: 4 mg via INTRAVENOUS

## 2022-09-03 MED ORDER — DEXAMETHASONE SODIUM PHOSPHATE 10 MG/ML IJ SOLN
INTRAMUSCULAR | Status: DC | PRN
  Administered 2022-09-03: 8 mg via INTRAVENOUS

## 2022-09-03 MED ORDER — PHENYLEPHRINE HCL-NACL 20-0.9 MG/250ML-% IV SOLN
INTRAVENOUS | Status: DC | PRN
  Administered 2022-09-03: 30 ug/min via INTRAVENOUS

## 2022-09-03 MED ORDER — FENTANYL CITRATE (PF) 100 MCG/2ML IJ SOLN
INTRAMUSCULAR | Status: AC
  Filled 2022-09-03: qty 2

## 2022-09-03 MED ORDER — OXYCODONE HCL 5 MG PO TABS
ORAL_TABLET | ORAL | Status: AC
  Filled 2022-09-03: qty 1

## 2022-09-03 MED ORDER — MIDAZOLAM HCL 2 MG/2ML IJ SOLN: 2.0000 mg | Freq: Once | INTRAMUSCULAR | Status: AC

## 2022-09-03 MED ORDER — DEXAMETHASONE SODIUM PHOSPHATE 10 MG/ML IJ SOLN
INTRAMUSCULAR | Status: AC
  Filled 2022-09-03: qty 1

## 2022-09-03 MED ORDER — METHOCARBAMOL 500 MG PO TABS: 500.0000 mg | ORAL_TABLET | Freq: Three times a day (TID) | ORAL | 1 refills | Status: DC | PRN

## 2022-09-03 MED ORDER — OXYCODONE HCL 5 MG/5ML PO SOLN: 5.0000 mg | Freq: Once | ORAL | Status: AC | PRN

## 2022-09-03 MED ORDER — OXYCODONE HCL 5 MG PO TABS: 5.0000 mg | ORAL_TABLET | ORAL | 0 refills | Status: DC | PRN

## 2022-09-03 MED ORDER — SODIUM CHLORIDE 0.9 % IR SOLN
Status: DC | PRN
  Administered 2022-09-03: 3000 mL

## 2022-09-03 MED ORDER — ORAL CARE MOUTH RINSE: 15.0000 mL | Freq: Once | OROMUCOSAL | Status: AC

## 2022-09-03 MED ORDER — FENTANYL CITRATE (PF) 100 MCG/2ML IJ SOLN
INTRAMUSCULAR | Status: DC | PRN
  Administered 2022-09-03: 100 ug via INTRAVENOUS

## 2022-09-03 MED ORDER — BUPIVACAINE LIPOSOME 1.3 % IJ SUSP
INTRAMUSCULAR | Status: DC | PRN
  Administered 2022-09-03: 10 mL via PERINEURAL

## 2022-09-03 MED ORDER — LACTATED RINGERS IV SOLN: INTRAVENOUS | Status: DC

## 2022-09-03 MED ORDER — MIDAZOLAM HCL 2 MG/2ML IJ SOLN
INTRAMUSCULAR | Status: AC
  Administered 2022-09-03: 2 mg via INTRAVENOUS
  Filled 2022-09-03: qty 2

## 2022-09-03 MED ORDER — SUCCINYLCHOLINE CHLORIDE 200 MG/10ML IV SOSY
PREFILLED_SYRINGE | INTRAVENOUS | Status: DC | PRN
  Administered 2022-09-03: 100 mg via INTRAVENOUS

## 2022-09-03 MED ORDER — FENTANYL CITRATE (PF) 250 MCG/5ML IJ SOLN
INTRAMUSCULAR | Status: AC
  Filled 2022-09-03: qty 5

## 2022-09-03 MED ORDER — PROPOFOL 10 MG/ML IV BOLUS
INTRAVENOUS | Status: AC
  Filled 2022-09-03: qty 20

## 2022-09-03 MED ORDER — PHENYLEPHRINE 80 MCG/ML (10ML) SYRINGE FOR IV PUSH (FOR BLOOD PRESSURE SUPPORT)
PREFILLED_SYRINGE | INTRAVENOUS | Status: AC
  Filled 2022-09-03: qty 10

## 2022-09-03 MED ORDER — ROCURONIUM BROMIDE 10 MG/ML (PF) SYRINGE
PREFILLED_SYRINGE | INTRAVENOUS | Status: DC | PRN
  Administered 2022-09-03: 40 mg via INTRAVENOUS

## 2022-09-03 MED ORDER — SUGAMMADEX SODIUM 200 MG/2ML IV SOLN
INTRAVENOUS | Status: DC | PRN
  Administered 2022-09-03: 200 mg via INTRAVENOUS

## 2022-09-03 MED ORDER — OXYCODONE HCL 5 MG PO TABS
5.0000 mg | ORAL_TABLET | Freq: Once | ORAL | Status: AC | PRN
  Administered 2022-09-03: 5 mg via ORAL

## 2022-09-03 MED ORDER — PROPOFOL 10 MG/ML IV BOLUS
INTRAVENOUS | Status: DC | PRN
Start: 2022-09-03 — End: 2022-09-03
  Administered 2022-09-03: 150 mg via INTRAVENOUS

## 2022-09-03 MED ORDER — CEFAZOLIN SODIUM-DEXTROSE 2-4 GM/100ML-% IV SOLN
INTRAVENOUS | Status: AC
  Filled 2022-09-03: qty 100

## 2022-09-03 MED ORDER — LIDOCAINE 2% (20 MG/ML) 5 ML SYRINGE
INTRAMUSCULAR | Status: AC
  Filled 2022-09-03: qty 5

## 2022-09-03 MED ORDER — FENTANYL CITRATE (PF) 100 MCG/2ML IJ SOLN
25.0000 ug | INTRAMUSCULAR | Status: DC | PRN
  Administered 2022-09-03 (×2): 50 ug via INTRAVENOUS

## 2022-09-03 MED ORDER — EPHEDRINE SULFATE-NACL 50-0.9 MG/10ML-% IV SOSY
PREFILLED_SYRINGE | INTRAVENOUS | Status: DC | PRN
  Administered 2022-09-03: 5 mg via INTRAVENOUS

## 2022-09-03 MED ORDER — CHLORHEXIDINE GLUCONATE 0.12 % MT SOLN
OROMUCOSAL | Status: AC
  Administered 2022-09-03: 15 mL via OROMUCOSAL
  Filled 2022-09-03: qty 15

## 2022-09-03 MED ORDER — POVIDONE-IODINE 10 % EX SWAB
2.0000 | Freq: Once | CUTANEOUS | Status: AC
  Administered 2022-09-03: 2 via TOPICAL

## 2022-09-03 MED ORDER — AMISULPRIDE (ANTIEMETIC) 5 MG/2ML IV SOLN: 10.0000 mg | Freq: Once | INTRAVENOUS | Status: AC | PRN

## 2022-09-03 MED ORDER — IBUPROFEN 800 MG PO TABS: 800.0000 mg | ORAL_TABLET | Freq: Three times a day (TID) | ORAL | 0 refills | Status: AC | PRN

## 2022-09-03 MED ORDER — POVIDONE-IODINE 7.5 % EX SOLN
Freq: Once | CUTANEOUS | Status: DC
  Filled 2022-09-03: qty 118

## 2022-09-03 MED ORDER — CEFAZOLIN SODIUM-DEXTROSE 2-4 GM/100ML-% IV SOLN
2.0000 g | INTRAVENOUS | Status: AC
  Administered 2022-09-03: 2 g via INTRAVENOUS

## 2022-09-03 MED ORDER — CHLORHEXIDINE GLUCONATE 0.12 % MT SOLN: 15.0000 mL | Freq: Once | OROMUCOSAL | Status: AC

## 2022-09-03 SURGICAL SUPPLY — 45 items
AID PSTN UNV HD RSTRNT DISP (MISCELLANEOUS) ×1
ALCOHOL 70% 16 OZ (MISCELLANEOUS) ×1 IMPLANT
BAG COUNTER SPONGE SURGICOUNT (BAG) ×1 IMPLANT
BAG SPNG CNTER NS LX DISP (BAG) ×1
BLADE EXCALIBUR 4.0X13 (MISCELLANEOUS) ×1 IMPLANT
BLADE SHAVER TORPEDO 4X13 (MISCELLANEOUS) IMPLANT
BLADE SURG 11 STRL SS (BLADE) ×1 IMPLANT
DRAPE IMP U-DRAPE 54X76 (DRAPES) ×1 IMPLANT
DRAPE INCISE IOBAN 66X45 STRL (DRAPES) ×1 IMPLANT
DRAPE STERI 35X30 U-POUCH (DRAPES) ×1 IMPLANT
DRAPE U-SHAPE 47X51 STRL (DRAPES) ×2 IMPLANT
DRSG AQUACEL AG ADV 3.5X 4 (GAUZE/BANDAGES/DRESSINGS) ×1 IMPLANT
DURAPREP 26ML APPLICATOR (WOUND CARE) ×1 IMPLANT
DW OUTFLOW CASSETTE/TUBE SET (MISCELLANEOUS) ×1 IMPLANT
ELECT REM PT RETURN 9FT ADLT (ELECTROSURGICAL) ×1
ELECTRODE REM PT RTRN 9FT ADLT (ELECTROSURGICAL) ×1 IMPLANT
GAUZE XEROFORM 1X8 LF (GAUZE/BANDAGES/DRESSINGS) ×1 IMPLANT
GLOVE BIOGEL M 6.5 STRL (GLOVE) ×1 IMPLANT
GLOVE BIOGEL PI IND STRL 6.5 (GLOVE) ×1 IMPLANT
GLOVE BIOGEL PI IND STRL 8 (GLOVE) ×1 IMPLANT
GLOVE ECLIPSE 8.0 STRL XLNG CF (GLOVE) ×1 IMPLANT
GLOVE SS BIOGEL STRL SZ 8 (GLOVE) ×1 IMPLANT
GOWN STRL REUS W/ TWL LRG LVL3 (GOWN DISPOSABLE) ×3 IMPLANT
GOWN STRL REUS W/TWL LRG LVL3 (GOWN DISPOSABLE) ×3
KIT BASIN OR (CUSTOM PROCEDURE TRAY) ×1 IMPLANT
KIT TURNOVER KIT B (KITS) ×1 IMPLANT
MANIFOLD NEPTUNE II (INSTRUMENTS) ×1 IMPLANT
NDL HYPO 25X1 1.5 SAFETY (NEEDLE) ×1 IMPLANT
NDL SPNL 18GX3.5 QUINCKE PK (NEEDLE) ×1 IMPLANT
NEEDLE HYPO 25X1 1.5 SAFETY (NEEDLE) ×1 IMPLANT
NEEDLE SPNL 18GX3.5 QUINCKE PK (NEEDLE) ×1 IMPLANT
NS IRRIG 1000ML POUR BTL (IV SOLUTION) ×1 IMPLANT
PACK SHOULDER (CUSTOM PROCEDURE TRAY) ×1 IMPLANT
PAD ARMBOARD 7.5X6 YLW CONV (MISCELLANEOUS) ×2 IMPLANT
PORT APPOLLO RF 90DEGREE MULTI (SURGICAL WAND) ×1 IMPLANT
RESTRAINT HEAD UNIVERSAL NS (MISCELLANEOUS) ×1 IMPLANT
SLING ARM FOAM STRAP LRG (SOFTGOODS) IMPLANT
SPONGE T-LAP 4X18 ~~LOC~~+RFID (SPONGE) ×1 IMPLANT
SUCTION TUBE FRAZIER 10FR DISP (SUCTIONS) IMPLANT
SUT ETHILON 3 0 PS 1 (SUTURE) ×1 IMPLANT
TOWEL GREEN STERILE (TOWEL DISPOSABLE) ×1 IMPLANT
TOWEL GREEN STERILE FF (TOWEL DISPOSABLE) ×1 IMPLANT
TUBING ARTHROSCOPY IRRIG 16FT (MISCELLANEOUS) ×1 IMPLANT
WAND ABLATOR APOLLO I90 (BUR) IMPLANT
WATER STERILE IRR 1000ML POUR (IV SOLUTION) ×1 IMPLANT

## 2022-09-03 NOTE — H&P (Signed)
Nancy Freeman is an 52 y.o. female.   Chief Complaint: Right shoulder stiffness HPI: Nancy Freeman is a 52 year old patient with right shoulder pain and stiffness.  She underwent labral repair and biceps tenodesis in January of this year.  Has done well from a stability standpoint but has had stiffness since surgery.  She has had injections as well as physical therapy with continued limitation of motion which interferes with ADLs.  After failure of conservative management she presents now for operative management after explanation of risk and benefits.  Past Medical History:  Diagnosis Date   ADHD (attention deficit hyperactivity disorder)    ADHD   Allergy    Anginal pain (HCC)    Normal TTE and myocardial perfusion scan 04/24/20   Anxiety    BRCA negative 08/2019   MyRisk neg   Family history of breast cancer 08/2019   IBIS=14.3%/riskscore=14.9%   Family history of ovarian cancer    GERD (gastroesophageal reflux disease)    Headache    migraines  1-2x/month   Hidradenoma VULVAR   Hypertension    IBS (irritable bowel syndrome)    Internal hemorrhoid     Past Surgical History:  Procedure Laterality Date   ABDOMINAL HYSTERECTOMY  01-19-2001  DR Nicholas Lose   W/ LEFT SALPINGO-OOPHORECTOMY AND EXCISION ENDOMETRIOSIS   BICEPT TENODESIS Right 03/20/2022   Procedure: OPEN BICEPS TENODESIS;  Surgeon: Cammy Copa, MD;  Location: [facility];  Service: Orthopedics;  Laterality: Right;   BREAST BIOPSY Left 09/22/2017   affirm bx, pash   CESAREAN SECTION     COLONOSCOPY     COLONOSCOPY WITH PROPOFOL N/A 05/10/2020   Procedure: COLONOSCOPY WITH PROPOFOL;  Surgeon: Midge Minium, MD;  Location: [facility];  Service: Endoscopy;  Laterality: N/A;   ESOPHAGOGASTRODUODENOSCOPY (EGD) WITH PROPOFOL N/A 05/10/2020   Procedure: ESOPHAGOGASTRODUODENOSCOPY (EGD) WITH PROPOFOL;  Surgeon: Midge Minium, MD;  Location: [facility];  Service: Endoscopy;  Laterality: N/A;    HEMORRHOID BANDING  2018   POLYPECTOMY N/A 05/10/2020   Procedure: POLYPECTOMY;  Surgeon: Midge Minium, MD;  Location: [facility];  Service: Endoscopy;  Laterality: N/A;   REMOVAL OF SIDE PLATE, DISTAL FIBULA, RIGHT ANKLE  05-08-2004   SHOULDER ARTHROSCOPY WITH OPEN ROTATOR CUFF REPAIR AND DISTAL CLAVICLE ACROMINECTOMY Right 03/20/2022   Procedure: RIGHT SHOULDER ARTHROSCOPY, POSTERIOR LABRAL REPAIR, DEBRIDEMENT, ARTHROSCOPIC DISTAL CLAVICLE EXCISION,;  Surgeon: Cammy Copa, MD;  Location: [facility];  Service: Orthopedics;  Laterality: Right;   UPPER GASTROINTESTINAL ENDOSCOPY      Family History  Problem Relation Age of Onset   Ovarian cancer Mother    Uterine cancer Mother    Breast cancer Mother 86   Diabetes Father    Kidney disease Paternal Grandmother    Breast cancer Maternal Aunt 72   Esophageal cancer Neg Hx    Colon cancer Neg Hx    Stomach cancer Neg Hx    Pancreatic cancer Neg Hx    Liver disease Neg Hx    Rectal cancer Neg Hx    Social History:  reports that she has never smoked. She has been exposed to tobacco smoke. She has never used smokeless tobacco. She reports current drug use. She reports that she does not drink alcohol.  Allergies:  Allergies  Allergen Reactions   Sulfa Antibiotics Hives and Itching    Medications Prior to Admission  Medication Sig Dispense Refill   famotidine (PEPCID) 20 MG tablet Take 20 mg by mouth daily  as needed for heartburn or indigestion.     hydrochlorothiazide (HYDRODIURIL) 12.5 MG tablet Take 12.5 mg by mouth daily.     melatonin 3 MG TABS tablet Take 3 mg by mouth at bedtime.     SUMAtriptan (IMITREX) 50 MG tablet Take 50 mg by mouth every 2 (two) hours as needed for migraine. May repeat in 2 hours if headache persists or recurs.     vitamin B-12 (CYANOCOBALAMIN) 100 MCG tablet Take 100 mcg by mouth daily.     Vitamin D, Ergocalciferol, (DRISDOL) 1.25 MG (50000 UNIT) CAPS capsule Take 50,000 Units  by mouth once a week.     WEGOVY 0.5 MG/0.5ML SOAJ Inject 0.5 mg into the skin once a week.     BYDUREON BCISE 2 MG/0.85ML AUIJ Inject 2 mg into the skin once a week. (Patient not taking: Reported on 09/03/2022)     losartan (COZAAR) 25 MG tablet Take 12.5 mg by mouth daily. (Patient not taking: Reported on 09/03/2022)      No results found for this or any previous visit (from the past 48 hour(s)). No results found.  Review of Systems  Musculoskeletal:  Positive for arthralgias.  All other systems reviewed and are negative.   Blood pressure 128/78, pulse 61, temperature 98.2 F (36.8 C), temperature source Oral, resp. rate 17, height 5\' 6"  (1.676 m), weight 83.5 kg, SpO2 97%. Physical Exam Vitals reviewed.  HENT:     Head: Normocephalic.     Nose: Nose normal.     Mouth/Throat:     Mouth: Mucous membranes are moist.  Eyes:     Pupils: Pupils are equal, round, and reactive to light.  Cardiovascular:     Rate and Rhythm: Normal rate.     Pulses: Normal pulses.  Pulmonary:     Effort: Pulmonary effort is normal.  Abdominal:     General: Abdomen is flat.  Musculoskeletal:     Cervical back: Normal range of motion.  Skin:    General: Skin is warm.     Capillary Refill: Capillary refill takes less than 2 seconds.  Neurological:     General: No focal deficit present.     Mental Status: She is alert.  Psychiatric:        Mood and Affect: Mood normal.     Ortho exam demonstrates right shoulder with 30 degrees external rotation, 60 degrees abduction, 95 degrees forward elevation passively and actively.  She has pain with passive motion of the shoulder especially at the ends of range of motion.  Does have some mild tenderness over the [facility] joint that continues.  Visions are well-healed.  Axillary nerve intact with deltoid firing.  2+ radial pulse of the operative extremity.  She has no palpable lymph nodes noted throughout the neck, jaw, clavicle region.  There is no asymmetric swelling  or any thyroid nodules palpable.   Assessment/Plan Impression is right shoulder adhesive capsulitis.  Plan is right shoulder examination under anesthesia, manipulation under anesthesia, rotator interval release, and postop CPM machine to facilitate gains in range of motion.  Also plan to start physical therapy early next week.  The risk and benefits of the procedure discussed with the patient including not limited to infection nerve or vessel damage incomplete restoration of functional range of motion as well as continued pain.  Patient understands risk and benefits.  Fracture risk also discussed which is low.  All questions answered  Burnard Bunting, MD 09/03/2022, 10:19 AM

## 2022-09-03 NOTE — Anesthesia Postprocedure Evaluation (Signed)
Anesthesia Post Note  Patient: Nancy Freeman  Procedure(s) Performed: RIGHT SHOULDER ARTHROSCOPY WITH ROTATOR INTERVAL RELEASE (Right: Shoulder) MANIPULATION UNDER ANESTHESIA (Right: Shoulder)     Patient location during evaluation: PACU Anesthesia Type: General Level of consciousness: awake Pain management: pain level controlled Vital Signs Assessment: post-procedure vital signs reviewed and stable Respiratory status: spontaneous breathing, nonlabored ventilation and respiratory function stable Cardiovascular status: blood pressure returned to baseline and stable Postop Assessment: no apparent nausea or vomiting Anesthetic complications: no   No notable events documented.  Last Vitals:  Vitals:   09/03/22 1300 09/03/22 1315  BP: 92/60 96/69  Pulse: (!) 52 (!) 48  Resp: 14 14  Temp:    SpO2: 92% 93%    Last Pain:  Vitals:   09/03/22 1315  TempSrc:   PainSc: Asleep                 Linton Rump

## 2022-09-03 NOTE — Brief Op Note (Signed)
   09/03/2022  12:13 PM  PATIENT:  Nancy Freeman  52 y.o. female  PRE-OPERATIVE DIAGNOSIS:  right frozen shoulder  POST-OPERATIVE DIAGNOSIS:  right frozen shoulder  PROCEDURE:  Procedure(s): RIGHT SHOULDER ARTHROSCOPY WITH ROTATOR INTERVAL RELEASE MANIPULATION UNDER ANESTHESIA  SURGEON:  Surgeon(s): August Saucer, Corrie Mckusick, MD  ASSISTANT: magnant pa  ANESTHESIA:   general  EBL: 3 ml    Total I/O In: 100 [IV Piggyback:100] Out: -   BLOOD ADMINISTERED: none  DRAINS: none   LOCAL MEDICATIONS USED:  none  SPECIMEN:  No Specimen  COUNTS:  YES  TOURNIQUET:  * No tourniquets in log *  DICTATION: .Other Dictation: Dictation Number [id number]  PLAN OF CARE: Discharge to home after PACU  PATIENT DISPOSITION:  PACU - hemodynamically stable

## 2022-09-03 NOTE — Anesthesia Procedure Notes (Signed)
Anesthesia Regional Block: Interscalene brachial plexus block   Pre-Anesthetic Checklist: , timeout performed,  Correct Patient, Correct Site, Correct Laterality,  Correct Procedure, Correct Position, site marked,  Risks and benefits discussed,  Surgical consent,  Pre-op evaluation,  At surgeon's request and post-op pain management  Laterality: Right  Prep: chloraprep       Needles:  Injection technique: Single-shot  Needle Type: Echogenic Stimulator Needle     Needle Length: 9cm  Needle Gauge: 21     Additional Needles:   Procedures:,,,, ultrasound used (permanent image in chart),,    Narrative:  Start time: 09/03/2022 10:27 AM End time: 09/03/2022 10:30 AM Injection made incrementally with aspirations every 5 mL.  Performed by: Personally  Anesthesiologist: Linton Rump, MD  Additional Notes: Discussed risks and benefits of nerve block including, but not limited to, prolonged and/or permanent nerve injury involving sensory and/or motor function. Monitors were applied and a time-out was performed. The nerve and associated structures were visualized under ultrasound guidance. After negative aspiration, local anesthetic was slowly injected around the nerve. There was no evidence of high pressure during the procedure. There were no paresthesias. VSS remained stable and the patient tolerated the procedure well.

## 2022-09-03 NOTE — Op Note (Signed)
NAMESTERLING, UCCI MEDICAL RECORD NO: [id number] ACCOUNT NO: [id number] DATE OF BIRTH: [birth year] FACILITY: [facility] LOCATION: [facility] PHYSICIAN: Graylin Shiver. August Saucer, MD  Operative Report   DATE OF PROCEDURE: 09/03/2022  PREOPERATIVE DIAGNOSIS:  Right shoulder adhesive capsulitis.  POSTOPERATIVE DIAGNOSIS:  Right shoulder adhesive capsulitis.  PROCEDURE:  Right shoulder manipulation under anesthesia with arthroscopy and rotator interval release.  SURGEON:  Graylin Shiver. August Saucer, MD  ASSISTANT:  Karenann Cai, PA.  INDICATIONS:  This is a patient who underwent right shoulder labral repair for instability about 6 months ago.  He has had some trouble, full range of motion.  He has failed conservative measures and presents now for operative management after  explanation of risks and benefits.  DESCRIPTION OF PROCEDURE:  The patient was brought to the operating room where general anesthetic was induced.  Preoperative antibiotics administered.  Timeout was called.  The patient was supine and examination under anesthesia was performed.  The  patient had about 20 degrees of external rotation and 15 degrees of abduction, about 100 of forward flexion and 60 degrees of isolated glenohumeral abduction.  With the patient anesthetized after we called time-out, the patient was taken into full  forward flexion with some disruption of adhesions noted.  We also took the patient up to about 90 degrees of abduction.  At this time, the patient was sat upright in the beach chair position with head in neutral position.  Right shoulder, arm and hand  then pre-scrubbed with alcohol and Betadine, allowed to air dry, prepped with DuraPrep solution and draped in sterile manner.  Ioban used to seal the operative field and cover the axilla.  Next, we placed the scope into the shoulder joint.  Prior labral  anterior portal was then created under direct visualization.  Rotator cuff intact.  Prior labral tear was also intact.   There was some synovitis within the rotator interval and within the capsule of the shoulder joint.  Anterior, inferior and posterior  inferior glenohumeral ligaments intact.  Shoulder stability was good with examination under anesthesia with about 1+ anterior, 1+ posterior and less than a centimeter sulcus sign instability.  At this time, the Arthrocare wand was used to release the  rotator interval, which increased external rotation at 15 degrees of abduction from 20 to about 60.  At this time, thorough irrigation of the joint was performed.  Adhesions within the subdeltoid space were also cleared out using a blunt obturator.  At  this time, instruments were removed, and the portals were closed using 3-0 nylon.  Impervious dressings placed.  Shoulder immobilizer placed.  The patient tolerated the procedure well without immediate complications, transferred to the recovery room in  stable condition.  Luke's assistance was required at all times for retraction, opening, closing manipulation assistance.  His assistance was a medical necessity.   PUS D: 09/03/2022 12:17:13 pm T: 09/03/2022 12:24:00 pm  JOB: [id number]/ [id number]

## 2022-09-03 NOTE — Transfer of Care (Signed)
Immediate Anesthesia Transfer of Care Note  Patient: Nancy Freeman  Procedure(s) Performed: RIGHT SHOULDER ARTHROSCOPY WITH ROTATOR INTERVAL RELEASE (Right: Shoulder) MANIPULATION UNDER ANESTHESIA (Right: Shoulder)  Patient Location: PACU  Anesthesia Type:General  Level of Consciousness: awake, alert , patient cooperative, and responds to stimulation  Airway & Oxygen Therapy: Patient Spontanous Breathing and Patient connected to face mask oxygen  Post-op Assessment: Report given to RN and Post -op Vital signs reviewed and stable  Post vital signs: Reviewed and stable  Last Vitals:  Vitals Value Taken Time  BP 135/92 09/03/22 1215  Temp    Pulse 66 09/03/22 1216  Resp 21 09/03/22 1216  SpO2 96 % 09/03/22 1216  Vitals shown include unfiled device data.  Last Pain:  Vitals:   09/03/22 1040  TempSrc:   PainSc: 0-No pain         Complications: No notable events documented.

## 2022-09-03 NOTE — Anesthesia Procedure Notes (Signed)
Procedure Name: Intubation Date/Time: 09/03/2022 11:09 AM  Performed by: Nelle Don, CRNAPre-anesthesia Checklist: Patient identified, Emergency Drugs available, Suction available and Patient being monitored Patient Re-evaluated:Patient Re-evaluated prior to induction Oxygen Delivery Method: Circle system utilized Preoxygenation: Pre-oxygenation with 100% oxygen Induction Type: IV induction and Rapid sequence Laryngoscope Size: Mac and 4 Grade View: Grade I Tube type: Oral Tube size: 7.0 mm Number of attempts: 1 Airway Equipment and Method: Stylet Placement Confirmation: ETT inserted through vocal cords under direct vision, positive ETCO2 and breath sounds checked- equal and bilateral Secured at: 22 cm Tube secured with: Tape Dental Injury: Teeth and Oropharynx as per pre-operative assessment

## 2022-09-04 ENCOUNTER — Encounter (HOSPITAL_COMMUNITY): Payer: Self-pay | Admitting: Orthopedic Surgery

## 2022-09-11 ENCOUNTER — Ambulatory Visit (INDEPENDENT_AMBULATORY_CARE_PROVIDER_SITE_OTHER): Payer: BC Managed Care – PPO | Admitting: Orthopedic Surgery

## 2022-09-11 ENCOUNTER — Encounter: Payer: Self-pay | Admitting: Orthopedic Surgery

## 2022-09-11 DIAGNOSIS — Z9889 Other specified postprocedural states: Secondary | ICD-10-CM

## 2022-09-11 NOTE — Progress Notes (Signed)
Post-Op Visit Note   Patient: Nancy Freeman           Date of Birth: May 17, 1970           MRN: [id number] Visit Date: 09/11/2022 PCP: Gildardo Pounds, PA   Assessment & Plan:  Chief Complaint:  Chief Complaint  Patient presents with   Right Shoulder - Routine Post Op   Visit Diagnoses:  1. Status post labral repair of shoulder     Plan: Patient presents now 1 week out right shoulder rotator interval release and manipulation for shoulder stiffness.  She is in a CPM machine at 85 degrees.  Has not started physical therapy yet.  On examination range of motion is 45/70/135.  Rotator cuff strength intact.  No instability.  Plan at this time is start physical therapy with physical therapy.  Want to get a little bit more range of motion but avoid creating any more instability.  Labral repair looks very good at the time of surgery.  Follow-up in 2 weeks for clinical recheck on range of motion.  Follow-Up Instructions: No follow-ups on file.   Orders:  No orders of the defined types were placed in this encounter.  No orders of the defined types were placed in this encounter.   Imaging: No results found.  PMFS History: Patient Active Problem List   Diagnosis Date Noted   Arthritis of right acromioclavicular joint 03/22/2022   Degenerative superior labral anterior-to-posterior (SLAP) tear of right shoulder 03/22/2022   Biceps tendonitis on right 03/22/2022   Tear of right glenoid labrum 03/22/2022   Chronic right shoulder pain 01/19/2022   Personal history of colonic polyps    Polyp of sigmoid colon    Gastroesophageal reflux disease without esophagitis    BRCA negative 10/13/2019   Family history of ovarian cancer 09/18/2019   Prolapsed internal hemorrhoids, grade 2 05/19/2016   Past Medical History:  Diagnosis Date   ADHD (attention deficit hyperactivity disorder)    ADHD   Allergy    Anginal pain (HCC)    Normal TTE and myocardial perfusion scan 04/24/20   Anxiety     BRCA negative 08/2019   MyRisk neg   Family history of breast cancer 08/2019   IBIS=14.3%/riskscore=14.9%   Family history of ovarian cancer    GERD (gastroesophageal reflux disease)    Headache    migraines  1-2x/month   Hidradenoma VULVAR   Hypertension    IBS (irritable bowel syndrome)    Internal hemorrhoid     Family History  Problem Relation Age of Onset   Ovarian cancer Mother    Uterine cancer Mother    Breast cancer Mother 26   Diabetes Father    Kidney disease Paternal Grandmother    Breast cancer Maternal Aunt 11   Esophageal cancer Neg Hx    Colon cancer Neg Hx    Stomach cancer Neg Hx    Pancreatic cancer Neg Hx    Liver disease Neg Hx    Rectal cancer Neg Hx     Past Surgical History:  Procedure Laterality Date   ABDOMINAL HYSTERECTOMY  01-19-2001  DR Nicholas Lose   W/ LEFT SALPINGO-OOPHORECTOMY AND EXCISION ENDOMETRIOSIS   BICEPT TENODESIS Right 03/20/2022   Procedure: OPEN BICEPS TENODESIS;  Surgeon: Cammy Copa, MD;  Location: [facility];  Service: Orthopedics;  Laterality: Right;   BREAST BIOPSY Left 09/22/2017   affirm bx, pash   CESAREAN SECTION     COLONOSCOPY  COLONOSCOPY WITH PROPOFOL N/A 05/10/2020   Procedure: COLONOSCOPY WITH PROPOFOL;  Surgeon: Midge Minium, MD;  Location: [facility];  Service: Endoscopy;  Laterality: N/A;   ESOPHAGOGASTRODUODENOSCOPY (EGD) WITH PROPOFOL N/A 05/10/2020   Procedure: ESOPHAGOGASTRODUODENOSCOPY (EGD) WITH PROPOFOL;  Surgeon: Midge Minium, MD;  Location: [facility];  Service: Endoscopy;  Laterality: N/A;   HEMORRHOID BANDING  2018   POLYPECTOMY N/A 05/10/2020   Procedure: POLYPECTOMY;  Surgeon: Midge Minium, MD;  Location: [facility];  Service: Endoscopy;  Laterality: N/A;   REMOVAL OF SIDE PLATE, DISTAL FIBULA, RIGHT ANKLE  05-08-2004   SHOULDER ARTHROSCOPY Right 09/03/2022   Procedure: RIGHT SHOULDER ARTHROSCOPY WITH ROTATOR INTERVAL RELEASE;  Surgeon: Cammy Copa, MD;  Location: [facility];  Service: Orthopedics;  Laterality: Right;   SHOULDER ARTHROSCOPY WITH OPEN ROTATOR CUFF REPAIR AND DISTAL CLAVICLE ACROMINECTOMY Right 03/20/2022   Procedure: RIGHT SHOULDER ARTHROSCOPY, POSTERIOR LABRAL REPAIR, DEBRIDEMENT, ARTHROSCOPIC DISTAL CLAVICLE EXCISION,;  Surgeon: Cammy Copa, MD;  Location: [facility];  Service: Orthopedics;  Laterality: Right;   SHOULDER CLOSED REDUCTION Right 09/03/2022   Procedure: MANIPULATION UNDER ANESTHESIA;  Surgeon: Cammy Copa, MD;  Location: [facility];  Service: Orthopedics;  Laterality: Right;   UPPER GASTROINTESTINAL ENDOSCOPY     Social History   Occupational History   Occupation: [employer]    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

## 2022-09-19 DIAGNOSIS — M7501 Adhesive capsulitis of right shoulder: Secondary | ICD-10-CM

## 2022-09-25 ENCOUNTER — Encounter: Payer: Self-pay | Admitting: Orthopedic Surgery

## 2022-09-25 ENCOUNTER — Ambulatory Visit (INDEPENDENT_AMBULATORY_CARE_PROVIDER_SITE_OTHER): Payer: BC Managed Care – PPO | Admitting: Orthopedic Surgery

## 2022-09-25 DIAGNOSIS — Z9889 Other specified postprocedural states: Secondary | ICD-10-CM

## 2022-09-25 NOTE — Progress Notes (Signed)
Post-Op Visit Note   Patient: Nancy Freeman           Date of Birth: [birth year]           MRN: [id number] Visit Date: 09/25/2022 PCP: Gildardo Pounds, PA   Assessment & Plan:  Chief Complaint:  Chief Complaint  Patient presents with   Right Shoulder - Routine Post Op    09/03/22 (3w 1d) Right Shoulder Arthroscopy With Rotator Interval Release - Right  Manipulation Under Anesthesia - Right      Visit Diagnoses:  1. Status post labral repair of shoulder     Plan: Pain show is a patient who is now about 3 weeks out right shoulder arthroscopy with rotator interval release.  Seeing physical therapy today.  On exam she has got about 30 of external rotation as well as forward flexion and abduction above shoulder level.  Plan at this time is to continue with range of motion exercises as tolerated and see her back in 6 weeks for final check  Follow-Up Instructions: No follow-ups on file.   Orders:  No orders of the defined types were placed in this encounter.  No orders of the defined types were placed in this encounter.   Imaging: No results found.  PMFS History: Patient Active Problem List   Diagnosis Date Noted   Adhesive capsulitis of right shoulder 09/19/2022   Arthritis of right acromioclavicular joint 03/22/2022   Degenerative superior labral anterior-to-posterior (SLAP) tear of right shoulder 03/22/2022   Biceps tendonitis on right 03/22/2022   Tear of right glenoid labrum 03/22/2022   Chronic right shoulder pain 01/19/2022   Personal history of colonic polyps    Polyp of sigmoid colon    Gastroesophageal reflux disease without esophagitis    BRCA negative 10/13/2019   Family history of ovarian cancer 09/18/2019   Prolapsed internal hemorrhoids, grade 2 05/19/2016   Past Medical History:  Diagnosis Date   ADHD (attention deficit hyperactivity disorder)    ADHD   Allergy    Anginal pain (HCC)    Normal TTE and myocardial perfusion scan 04/24/20   Anxiety     BRCA negative 08/2019   MyRisk neg   Family history of breast cancer 08/2019   IBIS=14.3%/riskscore=14.9%   Family history of ovarian cancer    GERD (gastroesophageal reflux disease)    Headache    migraines  1-2x/month   Hidradenoma VULVAR   Hypertension    IBS (irritable bowel syndrome)    Internal hemorrhoid     Family History  Problem Relation Age of Onset   Ovarian cancer Mother    Uterine cancer Mother    Breast cancer Mother 70   Diabetes Father    Kidney disease Paternal Grandmother    Breast cancer Maternal Aunt 7   Esophageal cancer Neg Hx    Colon cancer Neg Hx    Stomach cancer Neg Hx    Pancreatic cancer Neg Hx    Liver disease Neg Hx    Rectal cancer Neg Hx     Past Surgical History:  Procedure Laterality Date   ABDOMINAL HYSTERECTOMY  01-19-2001  DR Nicholas Lose   W/ LEFT SALPINGO-OOPHORECTOMY AND EXCISION ENDOMETRIOSIS   BICEPT TENODESIS Right 03/20/2022   Procedure: OPEN BICEPS TENODESIS;  Surgeon: Cammy Copa, MD;  Location: [facility];  Service: Orthopedics;  Laterality: Right;   BREAST BIOPSY Left 09/22/2017   affirm bx, pash   CESAREAN SECTION     COLONOSCOPY  COLONOSCOPY WITH PROPOFOL N/A 05/10/2020   Procedure: COLONOSCOPY WITH PROPOFOL;  Surgeon: Midge Minium, MD;  Location: [facility];  Service: Endoscopy;  Laterality: N/A;   ESOPHAGOGASTRODUODENOSCOPY (EGD) WITH PROPOFOL N/A 05/10/2020   Procedure: ESOPHAGOGASTRODUODENOSCOPY (EGD) WITH PROPOFOL;  Surgeon: Midge Minium, MD;  Location: [facility];  Service: Endoscopy;  Laterality: N/A;   HEMORRHOID BANDING  2018   POLYPECTOMY N/A 05/10/2020   Procedure: POLYPECTOMY;  Surgeon: Midge Minium, MD;  Location: [facility];  Service: Endoscopy;  Laterality: N/A;   REMOVAL OF SIDE PLATE, DISTAL FIBULA, RIGHT ANKLE  05-08-2004   SHOULDER ARTHROSCOPY Right 09/03/2022   Procedure: RIGHT SHOULDER ARTHROSCOPY WITH ROTATOR INTERVAL RELEASE;  Surgeon: Cammy Copa, MD;  Location: [facility] OR;  Service: Orthopedics;  Laterality: Right;   SHOULDER ARTHROSCOPY WITH OPEN ROTATOR CUFF REPAIR AND DISTAL CLAVICLE ACROMINECTOMY Right 03/20/2022   Procedure: RIGHT SHOULDER ARTHROSCOPY, POSTERIOR LABRAL REPAIR, DEBRIDEMENT, ARTHROSCOPIC DISTAL CLAVICLE EXCISION,;  Surgeon: Cammy Copa, MD;  Location: [facility];  Service: Orthopedics;  Laterality: Right;   SHOULDER CLOSED REDUCTION Right 09/03/2022   Procedure: MANIPULATION UNDER ANESTHESIA;  Surgeon: Cammy Copa, MD;  Location: [facility];  Service: Orthopedics;  Laterality: Right;   UPPER GASTROINTESTINAL ENDOSCOPY     Social History   Occupational History   Occupation: [employer]    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

## 2022-10-23 ENCOUNTER — Telehealth: Payer: Self-pay

## 2022-10-23 NOTE — Telephone Encounter (Signed)
Patient contacted office with concerns of possible bladder prolapse and was scheduled with Dr. Marice Potter on 10/29/22, patient is requesting a call back to see if there is any available appts for a earlier date and time? KW

## 2022-10-23 NOTE — Telephone Encounter (Signed)
Patient states that she has concerns of urinary prolapse. Patient states that she feels like she has a "heavy ball"between her legs and states that she has urinary urgency and has been dribbling. Patient states that she has difficulty passing bowel movement because of this and states that she has to use a Enema to pass stool. An appointment was scheduled for patient to be evaluated for urinary prolapse by Dr. Marice Potter on 10/29/22. Patient advised that if pelvic/pain discomfort worsens, if she develops a fever, back ache or experiencing vaginal bleeding she should be evaluated sooner. Message was sent to our administrative team to see if there is a sooner available appt. Patient states that she has had the problem now for 30 days or more, she would like to know if doctor advises that she do anything else until her appt? Please advise.

## 2022-10-23 NOTE — Telephone Encounter (Signed)
Patient has been advised. KW 

## 2022-10-29 ENCOUNTER — Ambulatory Visit: Payer: BC Managed Care – PPO | Admitting: Obstetrics & Gynecology

## 2022-11-03 ENCOUNTER — Encounter: Payer: Self-pay | Admitting: Obstetrics & Gynecology

## 2022-11-03 ENCOUNTER — Ambulatory Visit (INDEPENDENT_AMBULATORY_CARE_PROVIDER_SITE_OTHER): Payer: BC Managed Care – PPO | Admitting: Obstetrics & Gynecology

## 2022-11-03 VITALS — BP 122/87 | HR 64 | Ht 66.0 in | Wt 167.0 lb

## 2022-11-03 DIAGNOSIS — R32 Unspecified urinary incontinence: Secondary | ICD-10-CM

## 2022-11-03 DIAGNOSIS — R1031 Right lower quadrant pain: Secondary | ICD-10-CM

## 2022-11-03 NOTE — Progress Notes (Signed)
    GYNECOLOGY PROGRESS NOTE  Subjective:    Patient ID: Nancy Freeman, female    DOB: [birth year], 52 y.o.   MRN: [id number]  HPI 52 yo P1 who comes in with a return of her urinary incontinence issues. She saw Dr. Bonney Aid in 2022 for this issue. She did pelvic PT and this improved initially but has now returned. During that visit, he did not see any prolapse. However, since this Spring, she has been feeling discomfort vaginally, also with sex "It's like he's hitting something". She has had to start splinting with BMs. She denies current constipation. She had a hysterectomy at age 56 for endometriosis pain. Her child was delivered via Cesarean section.  She also had a recent occurrence of her external anal sphincter "opening" and feeling very painful while she was at work recently.  The following portions of the patient's history were reviewed and updated as appropriate: allergies, current medications, past family history, past medical history, past social history, past surgical history, and problem list.  Review of Systems Pertinent items are noted in HPI.  Her mammogram is UTD and normal. Colonoscopy - UTD  Objective:   Blood pressure 122/87, pulse 64, height 5\' 6"  (1.676 m), weight 167 lb (75.8 kg). Body mass index is 26.95 kg/m. Well nourished, well hydrated White female, no apparent distress She is ambulating and conversing normally. EG- mild VVA (expected) NO vaginal vault prolapse, no cystocele or rectocele There is a tender 1.5 cm rounded mass deep within the tissue at the base of her right labia majora Bimanual exam - no specific masses palpable, but distinct discomfort in her RLQ with exam   Assessment:   RLQ with bimanual exam - schedule gyn ultrasound Urinary incontinence - refer to urogyn Splinting with defecation- refer to urogyn Tender mass in lower right labia majora - ultrasound at time of pelvic ultrasound. If this is non- diagnostic, then rec CT/MRI (will have  to discuss with radiologist) FH of breast and ovarian cancer- negative genetic testing Painful occasion with her external anal sphincter - she should go back to her GI

## 2022-11-06 ENCOUNTER — Encounter: Payer: BC Managed Care – PPO | Admitting: Orthopedic Surgery

## 2022-11-24 ENCOUNTER — Ambulatory Visit: Payer: BC Managed Care – PPO | Admitting: Obstetrics & Gynecology

## 2022-11-24 ENCOUNTER — Encounter: Payer: Self-pay | Admitting: Obstetrics & Gynecology

## 2022-11-24 ENCOUNTER — Ambulatory Visit: Payer: BC Managed Care – PPO

## 2022-11-24 DIAGNOSIS — R32 Unspecified urinary incontinence: Secondary | ICD-10-CM | POA: Diagnosis not present

## 2022-11-24 DIAGNOSIS — R159 Full incontinence of feces: Secondary | ICD-10-CM | POA: Diagnosis not present

## 2022-11-24 DIAGNOSIS — S3422XD Injury of nerve root of sacral spine, subsequent encounter: Secondary | ICD-10-CM | POA: Diagnosis not present

## 2022-11-24 DIAGNOSIS — R1031 Right lower quadrant pain: Secondary | ICD-10-CM

## 2022-11-24 DIAGNOSIS — S3422XA Injury of nerve root of sacral spine, initial encounter: Secondary | ICD-10-CM

## 2022-11-24 NOTE — Progress Notes (Signed)
GYNECOLOGY PROGRESS NOTE  Subjective:    Patient ID: Nancy Freeman, female    DOB: February 29, 1970, 52 y.o.   MRN: [id number]  HPI  Patient is a 52 y.o. G1P1001 seen here today just after her ultrasound of her perineum to evaluate a palpable mass in the base of her labia majora. She had a CT at [facility] recently that showed a small cystic area of the vagina. On today's ultrasound, a small cystic area corresponding to the palpable area of the right labia majora is seen.  I saw her last month with several issues including her complaint of her external anal sphincter "opening". She also tells me of fecal incontinence today, although she did not mention that at our first meeting. With further exploration of this subject today, she reports that she had her first occasion of fecal incontinence about 2 1/2 years ago. Initially, this would only happen about once every 4 months. It has progressed to happening throughout the day, every day. She wears diapers continually at the present time.  She has a h/o urinary incontinence and has tried pelvic PT for this issue.  She went to the ED at [facility] and had a CT that showed S1 and S2 nerve abnormalities. A MRI was scheduled in the near future.   The following portions of the patient's history were reviewed and updated as appropriate: allergies, current medications, past family history, past medical history, past social history, past surgical history, and problem list.  Review of Systems Pertinent items are noted in HPI.   Objective:   There were no vitals taken for this visit. There is no height or weight on file to calculate BMI. Well nourished, well hydrated White female, no apparent distress She is ambulating and conversing normally.  Assessment:   1. 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 Urinary and fecal incontinence- refer to  urogynecology  The thickening of the sacral nerves may or may not be related to her incontinence.

## 2022-11-26 ENCOUNTER — Inpatient Hospital Stay
Admission: RE | Admit: 2022-11-26 | Discharge: 2022-11-26 | Disposition: A | Discharge status: Home / Self Care | Payer: Self-pay | Source: Ambulatory Visit | Attending: Neurosurgery | Admitting: Neurosurgery

## 2022-11-26 ENCOUNTER — Other Ambulatory Visit: Payer: Self-pay | Admitting: Family Medicine

## 2022-11-26 DIAGNOSIS — Z049 Encounter for examination and observation for unspecified reason: Secondary | ICD-10-CM

## 2022-11-27 NOTE — Progress Notes (Unsigned)
Referring Physician:  Gildardo Pounds, PA [address] [place],  [place] [address]  Primary Physician:  Gildardo Pounds, PA  History of Present Illness: 11/27/2022 Ms. Nancy Freeman is here today with a chief complaint of sacral masses.  During workup for a recent episode where she found stool being excreted from her vagina she had a pelvic CT scan which demonstrated a perivaginal cyst as well as a mass appearance on the S1 and S2 nerve roots.  Notably she does not have any symptoms in the S1 or S2 dermatomes.  She does not have any weakness.  She has had a region ultrasound which was unable to see these masses, and was overall inconclusive.  The symptoms are causing a significant impact on the patient's life.   I have utilized the care everywhere function in epic to review the outside records available from external health systems.  Review of Systems:  A 10 point review of systems is negative, except for the pertinent positives and negatives detailed in the HPI.  Past Medical History: Past Medical History:  Diagnosis Date   ADHD (attention deficit hyperactivity disorder)    ADHD   Allergy    Anginal pain (HCC)    Normal TTE and myocardial perfusion scan 04/24/20   Anxiety    BRCA negative 08/2019   MyRisk neg   Family history of breast cancer 08/2019   IBIS=14.3%/riskscore=14.9%   Family history of ovarian cancer    GERD (gastroesophageal reflux disease)    Headache    migraines  1-2x/month   Hidradenoma VULVAR   Hypertension    IBS (irritable bowel syndrome)    Internal hemorrhoid     Past Surgical History: Past Surgical History:  Procedure Laterality Date   ABDOMINAL HYSTERECTOMY  01-19-2001  DR Nicholas Lose   W/ LEFT SALPINGO-OOPHORECTOMY AND EXCISION ENDOMETRIOSIS   BICEPT TENODESIS Right 03/20/2022   Procedure: OPEN BICEPS TENODESIS;  Surgeon: Cammy Copa, MD;  Location: [facility];  Service: Orthopedics;  Laterality: Right;   BREAST BIOPSY Left  09/22/2017   affirm bx, pash   CESAREAN SECTION     COLONOSCOPY     COLONOSCOPY WITH PROPOFOL N/A 05/10/2020   Procedure: COLONOSCOPY WITH PROPOFOL;  Surgeon: Midge Minium, MD;  Location: [facility];  Service: Endoscopy;  Laterality: N/A;   ESOPHAGOGASTRODUODENOSCOPY (EGD) WITH PROPOFOL N/A 05/10/2020   Procedure: ESOPHAGOGASTRODUODENOSCOPY (EGD) WITH PROPOFOL;  Surgeon: Midge Minium, MD;  Location: [facility];  Service: Endoscopy;  Laterality: N/A;   HEMORRHOID BANDING  2018   POLYPECTOMY N/A 05/10/2020   Procedure: POLYPECTOMY;  Surgeon: Midge Minium, MD;  Location: [facility];  Service: Endoscopy;  Laterality: N/A;   REMOVAL OF SIDE PLATE, DISTAL FIBULA, RIGHT ANKLE  05-08-2004   SHOULDER ARTHROSCOPY Right 09/03/2022   Procedure: RIGHT SHOULDER ARTHROSCOPY WITH ROTATOR INTERVAL RELEASE;  Surgeon: Cammy Copa, MD;  Location: [facility] OR;  Service: Orthopedics;  Laterality: Right;   SHOULDER ARTHROSCOPY WITH OPEN ROTATOR CUFF REPAIR AND DISTAL CLAVICLE ACROMINECTOMY Right 03/20/2022   Procedure: RIGHT SHOULDER ARTHROSCOPY, POSTERIOR LABRAL REPAIR, DEBRIDEMENT, ARTHROSCOPIC DISTAL CLAVICLE EXCISION,;  Surgeon: Cammy Copa, MD;  Location: [facility];  Service: Orthopedics;  Laterality: Right;   SHOULDER CLOSED REDUCTION Right 09/03/2022   Procedure: MANIPULATION UNDER ANESTHESIA;  Surgeon: Cammy Copa, MD;  Location: [facility] OR;  Service: Orthopedics;  Laterality: Right;   UPPER GASTROINTESTINAL ENDOSCOPY      Allergies: Allergies as of 12/02/2022 - Review Complete  11/03/2022  Allergen Reaction Noted   Sulfa antibiotics Hives and Itching 12/23/2015    Medications:  Current Outpatient Medications:    BYDUREON BCISE 2 MG/0.85ML AUIJ, Inject 2 mg into the skin once a week., Disp: , Rfl:    famotidine (PEPCID) 20 MG tablet, Take 20 mg by mouth daily as needed for heartburn or indigestion., Disp: , Rfl:    hydrochlorothiazide (HYDRODIURIL)  12.5 MG tablet, Take 12.5 mg by mouth daily., Disp: , Rfl:    ibuprofen (ADVIL) 800 MG tablet, Take 1 tablet (800 mg total) by mouth every 8 (eight) hours as needed., Disp: 30 tablet, Rfl: 0   losartan (COZAAR) 25 MG tablet, Take 12.5 mg by mouth daily. (Patient not taking: Reported on 09/03/2022), Disp: , Rfl:    melatonin 3 MG TABS tablet, Take 3 mg by mouth at bedtime., Disp: , Rfl:    methocarbamol (ROBAXIN) 500 MG tablet, Take 1 tablet (500 mg total) by mouth every 8 (eight) hours as needed for muscle spasms., Disp: 30 tablet, Rfl: 1   oxyCODONE (ROXICODONE) 5 MG immediate release tablet, Take 1 tablet (5 mg total) by mouth every 4 (four) hours as needed for severe pain., Disp: 30 tablet, Rfl: 0   SUMAtriptan (IMITREX) 50 MG tablet, Take 50 mg by mouth every 2 (two) hours as needed for migraine. May repeat in 2 hours if headache persists or recurs., Disp: , Rfl:    vitamin B-12 (CYANOCOBALAMIN) 100 MCG tablet, Take 100 mcg by mouth daily., Disp: , Rfl:    Vitamin D, Ergocalciferol, (DRISDOL) 1.25 MG (50000 UNIT) CAPS capsule, Take 50,000 Units by mouth once a week., Disp: , Rfl:    WEGOVY 0.5 MG/0.5ML SOAJ, Inject 0.5 mg into the skin once a week., Disp: , Rfl:   Social History: Social History   Tobacco Use   Smoking status: Never    Passive exposure: Past   Smokeless tobacco: Never  Vaping Use   Vaping status: Never Used  Substance Use Topics   Alcohol use: No   Drug use: Yes    Comment: uses a gummy with CBD in it at night prn    Family Medical History: Family History  Problem Relation Age of Onset   Ovarian cancer Mother    Uterine cancer Mother    Breast cancer Mother 51   Diabetes Father    Kidney disease Paternal Grandmother    Breast cancer Maternal Aunt 60   Esophageal cancer Neg Hx    Colon cancer Neg Hx    Stomach cancer Neg Hx    Pancreatic cancer Neg Hx    Liver disease Neg Hx    Rectal cancer Neg Hx     Physical Examination: There were no vitals filed for  this visit.  General: Patient is in no apparent distress. Attention to examination is appropriate.  Neck:   Supple.  Full range of motion.  Respiratory: Patient is breathing without any difficulty.   NEUROLOGICAL:     Awake, alert, oriented to person, place, and time.  Speech is clear and fluent.   Cranial Nerves: Pupils equal round and reactive to light.  Facial tone is symmetric.  Facial sensation is symmetric. Shoulder shrug is symmetric. Tongue protrusion is midline.    Strength:  Side Iliopsoas Quads Hamstring PF DF EHL  R 5 5 5 5 5 5   L 5 5 5 5 5 5    Reflexes are 2 patella and achilles.     Bilateral lower extremity sensation is  in intact to light touch     No evidence of dysmetria noted.  Gait is normal.    Imaging: I personally reviewed her outside imaging from 11/26/2022.  They demonstrated masses near the right sided S1 and S2 nerve roots, potentially on the left S1 nerve root as well but not as clear.  I was able to find CT scans dating back to 2012 where these were also present.  They appear to vacillate in size on intermittent imaging.  On the most recent image it appears that she has a right sided Perivaginal fluid collection  I have personally reviewed the images and agree with the above interpretation.  Medical Decision Making/Assessment and Plan: Ms. Archuletta is a pleasant 52 y.o. female with recent evaluation for expression of stool material out of her vagina.  She was concerned for possible fistula, as she has had a previous hysterectomy.  As part of her workup she had a pelvic CT scan which demonstrated pelvic findings as described above, and an reference for this evaluation question of possible nerve root masses.  On physical examination she does not demonstrate any S1 or S2 radiculopathy, she does not have any clinical history of S1 or S2 issues.  The likelihood for developing incontinence from unilateral sacral lesions is quite low.  These have also been  present for at least 12 to 15 years on previous imaging.  Nerve lesions at this location would include possibility of a schwannoma or nerve sheath tumor, however given that it has increased and decreased on size on interval imaging a Tarlov cyst is also high on the differential.  I do feel that she would benefit from a MRI of her pelvis to evaluate for the possibility of nerve sheath tumors, I am going to reach out to her OB/GYN and gynecology team to verify the order that would be most helpful for both the nerve sheath lesions, and perivaginal fluid collection.  Once I hear back I will place an order for the appropriate MRI.  We can follow-up after the MRI to discuss the findings.  I do feel like she should maintain her referrals to urogynecology given her recent concern for possible fistula formation.  Thank you for involving me in the care of this patient.    Lovenia Kim MD/MSCR Neurosurgery

## 2022-12-01 ENCOUNTER — Ambulatory Visit: Payer: BC Managed Care – PPO | Admitting: Orthopedic Surgery

## 2022-12-02 ENCOUNTER — Encounter: Payer: Self-pay | Admitting: Neurosurgery

## 2022-12-02 ENCOUNTER — Ambulatory Visit (INDEPENDENT_AMBULATORY_CARE_PROVIDER_SITE_OTHER): Payer: BC Managed Care – PPO | Admitting: Neurosurgery

## 2022-12-02 VITALS — BP 124/80 | Ht 66.0 in | Wt 163.0 lb

## 2022-12-02 DIAGNOSIS — G96191 Perineural cyst: Secondary | ICD-10-CM

## 2022-12-02 DIAGNOSIS — R159 Full incontinence of feces: Secondary | ICD-10-CM

## 2022-12-02 DIAGNOSIS — D492 Neoplasm of unspecified behavior of bone, soft tissue, and skin: Secondary | ICD-10-CM

## 2022-12-02 DIAGNOSIS — G548 Other nerve root and plexus disorders: Secondary | ICD-10-CM | POA: Diagnosis not present

## 2022-12-04 ENCOUNTER — Encounter: Payer: BC Managed Care – PPO | Admitting: Orthopedic Surgery

## 2022-12-07 ENCOUNTER — Other Ambulatory Visit (INDEPENDENT_AMBULATORY_CARE_PROVIDER_SITE_OTHER): Payer: BC Managed Care – PPO | Admitting: Neurosurgery

## 2022-12-07 DIAGNOSIS — D492 Neoplasm of unspecified behavior of bone, soft tissue, and skin: Secondary | ICD-10-CM

## 2022-12-07 DIAGNOSIS — G96191 Perineural cyst: Secondary | ICD-10-CM

## 2022-12-07 DIAGNOSIS — R159 Full incontinence of feces: Secondary | ICD-10-CM

## 2022-12-07 NOTE — Progress Notes (Signed)
I was able to hear back from her obstetrician and gynecologist, they agree with the utility of an MRI of the pelvis with and without contrast to help further define these lumbar paraspinal lesions as well as the perivaginal lesion.  Placing MRI with and without contrast ordered today.

## 2022-12-29 ENCOUNTER — Ambulatory Visit: Payer: BC Managed Care – PPO | Admitting: Obstetrics and Gynecology

## 2023-01-01 ENCOUNTER — Ambulatory Visit (INDEPENDENT_AMBULATORY_CARE_PROVIDER_SITE_OTHER): Payer: BC Managed Care – PPO | Admitting: Obstetrics and Gynecology

## 2023-01-01 ENCOUNTER — Encounter: Payer: Self-pay | Admitting: Obstetrics and Gynecology

## 2023-01-01 VITALS — BP 117/73 | HR 60 | Ht 65.8 in | Wt 157.4 lb

## 2023-01-01 DIAGNOSIS — N3941 Urge incontinence: Secondary | ICD-10-CM

## 2023-01-01 DIAGNOSIS — M6289 Other specified disorders of muscle: Secondary | ICD-10-CM

## 2023-01-01 DIAGNOSIS — N3281 Overactive bladder: Secondary | ICD-10-CM

## 2023-01-01 DIAGNOSIS — R350 Frequency of micturition: Secondary | ICD-10-CM

## 2023-01-01 DIAGNOSIS — R151 Fecal smearing: Secondary | ICD-10-CM

## 2023-01-01 DIAGNOSIS — G96191 Perineural cyst: Secondary | ICD-10-CM

## 2023-01-01 DIAGNOSIS — M62838 Other muscle spasm: Secondary | ICD-10-CM

## 2023-01-01 DIAGNOSIS — N952 Postmenopausal atrophic vaginitis: Secondary | ICD-10-CM

## 2023-01-01 LAB — POCT URINALYSIS DIPSTICK
Bilirubin, UA: NEGATIVE
Blood, UA: NEGATIVE
Glucose, UA: NEGATIVE
Ketones, UA: NEGATIVE
Leukocytes, UA: NEGATIVE
Nitrite, UA: NEGATIVE
Protein, UA: POSITIVE — AB
Spec Grav, UA: 1.02 (ref 1.010–1.025)
Urobilinogen, UA: 0.2 U/dL
pH, UA: 6.5 (ref 5.0–8.0)

## 2023-01-01 MED ORDER — DIAZEPAM 5 MG PO TABS
ORAL_TABLET | ORAL | 0 refills | Status: AC
Start: 2023-01-01 — End: ?

## 2023-01-01 MED ORDER — ESTRADIOL 0.1 MG/GM VA CREA: 0.5000 g | TOPICAL_CREAM | VAGINAL | 11 refills | Status: AC

## 2023-01-01 MED ORDER — VIBEGRON 75 MG PO TABS
75.0000 mg | ORAL_TABLET | Freq: Every day | ORAL | 5 refills | Status: AC
Start: 2023-01-01 — End: ?

## 2023-01-01 NOTE — Progress Notes (Unsigned)
[facility] New Patient Evaluation and Consultation  Referring Provider: Allie Bossier, MD PCP: Gildardo Pounds, PA Date of Service: 01/01/2023  SUBJECTIVE Chief Complaint: New Patient (Initial Visit) (Nancy Freeman is a 52 y.o. female is here for urine and fecal incontinence.)  History of Present Illness: Nancy Freeman is a 52 y.o. White or Caucasian female seen in consultation at the request of Dr. Marice Potter for evaluation of incontinence.  She has done pelvic floor PT but it was with a private office in [place] and she had no internal work done.   Review of records significant for: MRI 12/22/22  Constance Holster, MD - 12/22/2022  Formatting of this note might be different from the original.  EXAM: MRI pelvis with and without contrast  ACCESSION: [id number] UN   CLINICAL INDICATION: 52 years old with recent CT that showed mass on vaginal wall and S1 and S2 mass; Pt is experiencing incontinence both with bladder and rectal  - R19.00 - Pelvic mass    COMPARISON: CT abdomen pelvis 11/03/2022   TECHNIQUE: MRI of the pelvis was obtained with and without IV contrast. Multisequence, multiplanar images were obtained.   CONTRAST: 7 mL of Gadopiclenol   FINDINGS:  BLADDER: Unremarkable.   PELVIC/REPRODUCTIVE ORGANS: Postsurgical changes of hysterectomy and bilateral salpingo-oophorectomy. No thickening or enhancement of the vaginal cuff. The lesion along the posterior lateral right vaginal wall is no longer visualized.   GI TRACT: No dilated or thick walled loops of bowel.   PERITONEUM/RETROPERITONEUM AND MESENTERY: No free air or fluid.   LYMPH NODES: No enlarged lymph nodes.   VESSELS: The aorta is normal in caliber.    BONES AND SOFT TISSUES: Multilevel degenerative change of the spine. Bilateral S1 and S2 Tarlov cysts which correlate with the low-density masses along the sacral nerve roots described on CT.   IMPRESSION:  --Interval resolution of the vaginal mass,  this may've been infectious/inflammatory process or a prominent Bartholin's cyst. No further imaging follow-up is needed for this finding.  --Bilateral S1-S2 Tarlov cysts, these are benign perineural cysts for which no imaging follow-up is recommended  [place] 10/1 Indications:Pelvic Pain - mass of labia Findings:  The uterus is  surgically removed        Right Ovary surgically removed Left Ovary surgically removed Survey of the adnexa demonstrates no adnexal masses. There is no free fluid in the cul de sac.   Area of palpation inferior to rt labia seen measuring; 0.93 x 0.81 x 1.10 cm   Impression: 1. Soft tissue area inferior to labia seen  Urinary Symptoms: Leaks urine with cough/ sneeze, laughing, exercise, lifting, without sensation, and while asleep Leaks 3-4 time(s) per days.  Pad use: 2 adult diapers per day.   Patient is bothered by UI symptoms.  Day time voids 3-5.  Nocturia: 0-1 times per night to void. Voiding dysfunction:  does not empty bladder well.  Patient does not use a catheter to empty bladder.  When urinating, patient feels difficulty starting urine stream and dribbling after finishing Drinks: 60oz Water, 20oz Gatorade, Occasional pepsi Pepsi per day  UTIs: 0 UTI's in the last year.   Denies history of blood in urine, kidney or bladder stones, pyelonephritis, bladder cancer, and kidney cancer No results found for the last 90 days.   Pelvic Organ Prolapse Symptoms:                  Patient Denies a feeling of a bulge the vaginal area.  Bowel Symptom: Bowel movements: 1-2 time(s) per day Stool consistency: soft  Straining: no.  Splinting: yes.  Incomplete evacuation: yes.  Patient Admits to accidental bowel leakage / fecal incontinence  Occurs: 6-8 time(s) per week  Consistency with leakage: soft  Bowel regimen:  Bidet, enema, cashews for bulk fiber, smooth move tea Last colonoscopy: Date 2022, Internal hemorrhoids and 1 polyp found [facility] Colonoscopy           Colonoscopy (Every 7 Years) Next due on 05/11/2027    05/10/2020  COLONOSCOPY   Only the first 1 history entries have been loaded, but more history exists.            Sexual Function Sexually active: yes.  Sexual orientation: Straight Pain with sex: Yes, at the vaginal opening, deep in the pelvis  Pelvic Pain Admits to pelvic pain Location: Vagina and area between labia and buttock area on right side Pain occurs: sitting, walking, lying down Prior pain treatment: Pelvic floor exercises, stretching, stool softeners, fiber, Tylenol, Aleve, weight loss, rest Improved by: Warm Baths and bowel relief Worsened by: large meals and infrequent bowel movements   Past Medical History:  Past Medical History:  Diagnosis Date  . ADHD (attention deficit hyperactivity disorder)    ADHD  . Allergy   . Anginal pain (HCC)    Normal TTE and myocardial perfusion scan 04/24/20  . Anxiety   . BRCA negative 08/2019   MyRisk neg  . Family history of breast cancer 08/2019   IBIS=14.3%/riskscore=14.9%  . Family history of ovarian cancer   . GERD (gastroesophageal reflux disease)   . Headache    migraines  1-2x/month  . Hidradenoma VULVAR  . Hypertension   . IBS (irritable bowel syndrome)   . Internal hemorrhoid      Past Surgical History:   Past Surgical History:  Procedure Laterality Date  . ABDOMINAL HYSTERECTOMY  01-19-2001  DR LOMAX   W/ LEFT SALPINGO-OOPHORECTOMY AND EXCISION ENDOMETRIOSIS  . BICEPT TENODESIS Right 03/20/2022   Procedure: OPEN BICEPS TENODESIS;  Surgeon: Cammy Copa, MD;  Location: [facility];  Service: Orthopedics;  Laterality: Right;  . BREAST BIOPSY Left 09/22/2017   affirm bx, pash  . CESAREAN SECTION    . COLONOSCOPY    . COLONOSCOPY WITH PROPOFOL N/A 05/10/2020   Procedure: COLONOSCOPY WITH PROPOFOL;  Surgeon: Midge Minium, MD;  Location: [facility];  Service: Endoscopy;  Laterality: N/A;  . ESOPHAGOGASTRODUODENOSCOPY  (EGD) WITH PROPOFOL N/A 05/10/2020   Procedure: ESOPHAGOGASTRODUODENOSCOPY (EGD) WITH PROPOFOL;  Surgeon: Midge Minium, MD;  Location: [facility];  Service: Endoscopy;  Laterality: N/A;  . HEMORRHOID BANDING  2018  . POLYPECTOMY N/A 05/10/2020   Procedure: POLYPECTOMY;  Surgeon: Midge Minium, MD;  Location: [facility];  Service: Endoscopy;  Laterality: N/A;  . REMOVAL OF SIDE PLATE, DISTAL FIBULA, RIGHT ANKLE  05-08-2004  . SHOULDER ARTHROSCOPY Right 09/03/2022   Procedure: RIGHT SHOULDER ARTHROSCOPY WITH ROTATOR INTERVAL RELEASE;  Surgeon: Cammy Copa, MD;  Location: [facility] OR;  Service: Orthopedics;  Laterality: Right;  . SHOULDER ARTHROSCOPY WITH OPEN ROTATOR CUFF REPAIR AND DISTAL CLAVICLE ACROMINECTOMY Right 03/20/2022   Procedure: RIGHT SHOULDER ARTHROSCOPY, POSTERIOR LABRAL REPAIR, DEBRIDEMENT, ARTHROSCOPIC DISTAL CLAVICLE EXCISION,;  Surgeon: Cammy Copa, MD;  Location:  SURGERY CENTER;  Service: Orthopedics;  Laterality: Right;  . SHOULDER CLOSED REDUCTION Right 09/03/2022   Procedure: MANIPULATION UNDER ANESTHESIA;  Surgeon: Cammy Copa, MD;  Location: [facility] OR;  Service: Orthopedics;  Laterality: Right;  .  UPPER GASTROINTESTINAL ENDOSCOPY       Past OB/GYN History: G1P1001 Vaginal deliveries: 0,  Forceps/ Vacuum deliveries: 0, Cesarean section: 1 Menopausal: Yes, at age 11 Contraception: Hysterectomy  Last pap smear was N/a HM PAP     This patient has no relevant Health Maintenance data.       Medications: Patient has a current medication list which includes the following prescription(s): diazepam, [START ON 01/04/2023] estradiol, famotidine, ibuprofen, melatonin, ondansetron, sumatriptan, and vibegron.   Allergies: Patient is allergic to sulfa antibiotics.   Social History:  Social History   Tobacco Use  . Smoking status: Never    Passive exposure: Past  . Smokeless tobacco: Never  Vaping Use  . Vaping status: Never Used   Substance Use Topics  . Alcohol use: No  . Drug use: Yes    Comment: uses a gummy with CBD in it at night prn    Relationship status: married Patient lives with spose.   Patient is employed. 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  Regular exercise: Yes: Walks 30 minutes daily. History of abuse: No  Family History:   Family History  Problem Relation Age of Onset  . Ovarian cancer Mother   . Uterine cancer Mother   . Breast cancer Mother 47  . Diabetes Father   . Kidney disease Paternal Grandmother   . Breast cancer Maternal Aunt 60  . Esophageal cancer Neg Hx   . Colon cancer Neg Hx   . Stomach cancer Neg Hx   . Pancreatic cancer Neg Hx   . Liver disease Neg Hx   . Rectal cancer Neg Hx      Review of Systems: Review of Systems  Constitutional:  Positive for malaise/fatigue and weight loss. Negative for chills and fever.  Respiratory:  Negative for cough and shortness of breath.   Cardiovascular:  Positive for leg swelling. Negative for chest pain and palpitations.  Gastrointestinal:  Positive for abdominal pain. Negative for blood in stool, constipation and diarrhea.  Skin:  Negative for rash.  Neurological:  Positive for headaches. Negative for weakness.  Endo/Heme/Allergies:  Bruises/bleeds easily.  Psychiatric/Behavioral:  Negative for depression and suicidal ideas.      OBJECTIVE Physical Exam: Vitals:   01/01/23 0816  BP: 117/73  Pulse: 60  Weight: 157 lb 6.4 oz (71.4 kg)  Height: 5' 5.8" (1.671 m)    Physical Exam Vitals reviewed. Exam conducted with a chaperone present.  Constitutional:      Appearance: Normal appearance.  Pulmonary:     Effort: Pulmonary effort is normal.  Abdominal:     Palpations: Abdomen is soft.  Skin:    General: Skin is warm and dry.  Neurological:     General: No focal deficit present.     Mental Status: She is alert and oriented to person, place, and time.  Psychiatric:        Mood and Affect:  Mood normal.        Behavior: Behavior normal. Behavior is cooperative.        Thought Content: Thought content normal.     GU / Detailed Urogynecologic Evaluation:  Pelvic Exam: Normal external female genitalia; Bartholin's and Skene's glands normal in appearance; urethral meatus normal in appearance, no urethral masses or discharge.   CST: negative  s/p hysterectomy: Speculum exam reveals normal vaginal mucosa with severe atrophy and normal vaginal cuff.  Adnexa normal adnexa.    With apex supported, anterior compartment defect was reduced  Pelvic floor strength I/V  Pelvic floor musculature: Right levator tender, Right obturator tender, Left levator tender, Left obturator tender  POP-Q:   POP-Q  -2.5                                            Aa   -2.5                                           Ba  -7                                              C   2.5                                            Gh  3                                            Pb  8.5                                            tvl   -2                                            Ap  -2                                            Bp                                                 D      Rectal Exam:  Normal external exam.  Post-Void Residual (PVR) by Bladder Scan: In order to evaluate bladder emptying, we discussed obtaining a postvoid residual and patient agreed to this procedure.  Procedure: The ultrasound unit was placed on the patient's abdomen in the suprapubic region after the patient had voided.    Post Void Residual - 01/01/23 0826       Post Void Residual   Post Void Residual 23 mL              Laboratory Results: Lab Results  Component Value Date   COLORU yellow 01/01/2023   CLARITYU clear 01/01/2023   GLUCOSEUR Negative 01/01/2023   BILIRUBINUR negative 01/01/2023   KETONESU negative 01/01/2023   SPECGRAV 1.020 01/01/2023   RBCUR negative 01/01/2023   PHUR 6.5  01/01/2023   PROTEINUR Positive (A) 01/01/2023   UROBILINOGEN 0.2 01/01/2023  LEUKOCYTESUR Negative 01/01/2023    Lab Results  Component Value Date   CREATININE 1.09 (H) 08/25/2022   CREATININE 0.93 03/18/2022   CREATININE 1.13 (H) 09/05/2019    No results found for: "HGBA1C"  Lab Results  Component Value Date   HGB 14.4 08/25/2022     ASSESSMENT AND PLAN Ms. Tanna is a 52 y.o. with:  1. Urinary frequency   2. OAB (overactive bladder)   3. Tarlov cysts   4. Fecal smearing   5. Urge incontinence of urine   6. Pelvic floor dysfunction   7. Levator spasm        Selmer Dominion, NP

## 2023-01-01 NOTE — Patient Instructions (Addendum)
Vaginal atrophy: Start estrogen cream nightly for the next two weeks. After the first two weeks then twice a week. When you are not using the estrogen cream you should do coconut oil or a silcon based lubricant.   You can also chew a plain Tums 1-3 times per day to make your urine less acidic, especially if you have eating/drinking acidic things.   The Most Bothersome Foods* The Least Bothersome Foods*  Coffee - Regular & Decaf Tea - caffeinated Carbonated beverages - cola, non-colas, diet & caffeine-free Alcohols - Beer, Red Wine, White Wine, [address] - Grapefruit, [place], Orange, [employer] - Cranberry, Grapefruit, Orange, Pineapple Vegetables - Tomato & Tomato Products Flavor Enhancers - Hot peppers, Spicy foods, Chili, Horseradish, Vinegar, Monosodium glutamate (MSG) Artificial Sweeteners - NutraSweet, Sweet 'N Low, Equal (sweetener), Saccharin Ethnic foods - [place], [place], [place] food [employer] - low-fat & whole Fruits - Bananas, Blueberries, Honeydew melon, Pears, Raisins, Watermelon Vegetables - Broccoli, [address] Sprouts, [place], Carrots, Cauliflower, [place], Cucumber, Mushrooms, Peas, Radishes, Squash, Zucchini, White potatoes, Sweet potatoes & yams Poultry - Chicken, Eggs, [place], [employer] - Beef, Diplomatic Services operational officer, Lamb Seafood - Shrimp, [place] fish, Salmon Grains - Oat, Rice Snacks - Pretzels, Popcorn  *Lenward Chancellor et al. Diet and its role in interstitial cystitis/bladder pain syndrome (IC/BPS) and comorbid conditions. [employer]U [employer]. BJU Int. 2012 Jan 11.    Use the vaginal valium prior to intercourse and in the evenings.   I have sent a referral to pelvic floor PT at [facility] with Weber Cooks

## 2023-01-03 ENCOUNTER — Encounter: Payer: Self-pay | Admitting: Obstetrics and Gynecology

## 2023-01-03 DIAGNOSIS — N3941 Urge incontinence: Secondary | ICD-10-CM

## 2023-01-03 DIAGNOSIS — G96191 Perineural cyst: Secondary | ICD-10-CM

## 2023-01-03 DIAGNOSIS — R151 Fecal smearing: Secondary | ICD-10-CM

## 2023-01-04 ENCOUNTER — Telehealth: Payer: BC Managed Care – PPO | Admitting: Neurosurgery

## 2023-01-19 NOTE — Progress Notes (Unsigned)
Celso Dejana, PA-C [address]  [address]  [place], [place] [address]  Main: [phone]  Fax: [phone]   Gastroenterology Consultation  Referring Provider:     Gildardo Pounds, PA Primary Care Physician:  Gildardo Pounds, PA Primary Gastroenterologist:  Celso Dellene, PA-C / Dr. Midge Minium   Reason for Consultation:     Incontinence, Rectal Spasm        HPI:   Nancy Freeman is a 52 y.o. y/o female referred for consultation & management  by Gildardo Pounds, PA.  Patient has had fecal and bladder incontinence episodes for several months.  Started after she had an episode of constipation with no bowel movement for 1 week.  She noticed a pouch and protrusion from the rectum, mild hemorrhoid bleeding, and had to manually splint her perineum in order to have a bowel movement.  She has recently been seeing a Warehouse manager through [employer].  She was prescribed Valium suppositories and estrogen cream for the past 2 weeks to help with vaginal spasm.  She has also been referred for pelvic floor physical therapy.    Patient states she has tried Benefiber and Metamucil in the past which caused increased bloating and gas.  Currently she is having normal bowel movement once or twice daily which is formed.  She drinks smooth move tea occasionally as needed for constipation.  She denies any recent hard stools, straining, diarrhea, or rectal bleeding.  She has intermittent chronic right-sided abdominal pain which comes and goes.  No abdominal pain today.  No unintentional weight loss or alarm symptoms.  Colonoscopy 04/2020 by Dr. Servando Snare showed 4 mm colon mucosa polyp removed from sigmoid colon.  Nonbleeding grade 1 internal hemorrhoids.  Good prep.  10-year repeat. Colonoscopy in 2017 showed a few small hyperplastic polyps removed.  She has no family history of colon cancer.  EGD 04/2020: Small hiatal hernia, otherwise normal.  No biopsies.  11/03/2022 labs: Normal CBC, CMP, sed rate,  CRP, PT/INR.  Hemoglobin 13.4, WBC 4.5.  Has history of chronic right side abdominal pain, and IBS.  Banding of internal hemorrhoids in 2018 by Dr. Leone Payor.  She has seen Dr. Tobi Bastos and Dr. Servando Snare for abdominal pain in the past.  -11/24/22: Pelvic ultrasound: showed previous hysterectomy and BSO.  Small right labial lesion seen.  Followed up with GYN. -11/26/2022: CT abdomen pelvis with contrast: showed 2.5 cm lesion right vaginal wall suspicious for Bartholian Gland cyst.  NO GI pathology or abnormality to explain abdominal pain.  Normal gallbladder.  Previous hysterectomy.  Tiny benign hepatic cysts. -12/22/22 Pelvic MRI: Resolution of the vaginal mass.  No further follow-up recommended.   Past Medical History:  Diagnosis Date   ADHD (attention deficit hyperactivity disorder)    ADHD   Allergy    Anginal pain (HCC)    Normal TTE and myocardial perfusion scan 04/24/20   Anxiety    BRCA negative 08/2019   MyRisk neg   Family history of breast cancer 08/2019   IBIS=14.3%/riskscore=14.9%   Family history of ovarian cancer    GERD (gastroesophageal reflux disease)    Headache    migraines  1-2x/month   Hidradenoma VULVAR   Hypertension    IBS (irritable bowel syndrome)    Internal hemorrhoid     Past Surgical History:  Procedure Laterality Date   ABDOMINAL HYSTERECTOMY  01-19-2001  DR LOMAX   W/ LEFT SALPINGO-OOPHORECTOMY AND EXCISION ENDOMETRIOSIS   BICEPT TENODESIS Right 03/20/2022  Procedure: OPEN BICEPS TENODESIS;  Surgeon: Cammy Copa, MD;  Location: [facility];  Service: Orthopedics;  Laterality: Right;   BREAST BIOPSY Left 09/22/2017   affirm bx, pash   CESAREAN SECTION     COLONOSCOPY     COLONOSCOPY WITH PROPOFOL N/A 05/10/2020   Procedure: COLONOSCOPY WITH PROPOFOL;  Surgeon: Midge Minium, MD;  Location: [facility];  Service: Endoscopy;  Laterality: N/A;   ESOPHAGOGASTRODUODENOSCOPY (EGD) WITH PROPOFOL N/A 05/10/2020   Procedure:  ESOPHAGOGASTRODUODENOSCOPY (EGD) WITH PROPOFOL;  Surgeon: Midge Minium, MD;  Location: [facility];  Service: Endoscopy;  Laterality: N/A;   HEMORRHOID BANDING  2018   POLYPECTOMY N/A 05/10/2020   Procedure: POLYPECTOMY;  Surgeon: Midge Minium, MD;  Location: [facility];  Service: Endoscopy;  Laterality: N/A;   REMOVAL OF SIDE PLATE, DISTAL FIBULA, RIGHT ANKLE  05-08-2004   SHOULDER ARTHROSCOPY Right 09/03/2022   Procedure: RIGHT SHOULDER ARTHROSCOPY WITH ROTATOR INTERVAL RELEASE;  Surgeon: Cammy Copa, MD;  Location: [facility] OR;  Service: Orthopedics;  Laterality: Right;   SHOULDER ARTHROSCOPY WITH OPEN ROTATOR CUFF REPAIR AND DISTAL CLAVICLE ACROMINECTOMY Right 03/20/2022   Procedure: RIGHT SHOULDER ARTHROSCOPY, POSTERIOR LABRAL REPAIR, DEBRIDEMENT, ARTHROSCOPIC DISTAL CLAVICLE EXCISION,;  Surgeon: Cammy Copa, MD;  Location: [facility];  Service: Orthopedics;  Laterality: Right;   SHOULDER CLOSED REDUCTION Right 09/03/2022   Procedure: MANIPULATION UNDER ANESTHESIA;  Surgeon: Cammy Copa, MD;  Location: [facility] OR;  Service: Orthopedics;  Laterality: Right;   UPPER GASTROINTESTINAL ENDOSCOPY      Prior to Admission medications   Medication Sig Start Date End Date Taking? Authorizing Provider  diazepam (VALIUM) 5 MG tablet Place 1 tablet vaginally nightly as needed for muscle spasm/ pelvic pain. 01/01/23   Selmer Dominion, NP  estradiol (ESTRACE) 0.1 MG/GM vaginal cream Place 0.5 g vaginally 2 (two) times a week. Place 0.5g nightly for two weeks then twice a week after 01/04/23   Selmer Dominion, NP  famotidine (PEPCID) 20 MG tablet Take 20 mg by mouth daily as needed for heartburn or indigestion.    [provider]  ibuprofen (ADVIL) 800 MG tablet Take 1 tablet (800 mg total) by mouth every 8 (eight) hours as needed. 09/03/22   Magnant, Charles L, PA-C  melatonin 3 MG TABS tablet Take 3 mg by mouth at bedtime.    [provider]   ondansetron (ZOFRAN) 4 MG tablet Take 4 mg by mouth every 8 (eight) hours as needed.    [provider]  SUMAtriptan (IMITREX) 50 MG tablet Take 50 mg by mouth every 2 (two) hours as needed for migraine. May repeat in 2 hours if headache persists or recurs.    [provider]  Vibegron 75 MG TABS Take 1 tablet (75 mg total) by mouth daily. If too expensive please sent [place] a message/PA 01/01/23   Selmer Dominion, NP    Family History  Problem Relation Age of Onset   Ovarian cancer Mother    Uterine cancer Mother    Breast cancer Mother 66   Diabetes Father    Kidney disease Paternal Grandmother    Breast cancer Maternal Aunt 60   Esophageal cancer Neg Hx    Colon cancer Neg Hx    Stomach cancer Neg Hx    Pancreatic cancer Neg Hx    Liver disease Neg Hx    Rectal cancer Neg Hx      Social History   Tobacco Use  Smoking status: Never    Passive exposure: Past   Smokeless tobacco: Never  Vaping Use   Vaping status: Never Used  Substance Use Topics   Alcohol use: No   Drug use: Yes    Comment: uses a gummy with CBD in it at night prn    Allergies as of 01/20/2023 - Review Complete 01/20/2023  Allergen Reaction Noted   Sulfa antibiotics Hives and Itching 12/23/2015    Review of Systems:    All systems reviewed and negative except where noted in HPI.   Physical Exam:  BP 114/75   Pulse 62   Temp 98.3 F (36.8 C)   Ht 5\' 6"  (1.676 m)   Wt 158 lb (71.7 kg)   BMI 25.50 kg/m  No LMP recorded. Patient has had a hysterectomy.  General:   Alert,  Well-developed, well-nourished, pleasant and cooperative in NAD Lungs:  Respirations even and unlabored.  Clear throughout to auscultation.   No wheezes, crackles, or rhonchi. No acute distress. Heart:  Regular rate and rhythm; no murmurs, clicks, rubs, or gallops. Abdomen:  Normal bowel sounds.  No bruits.  Soft, and non-distended without masses, hepatosplenomegaly or hernias noted.  There is Bilateral Lower  Abdominal Tenderness.  No upper abdominal tenderness.  No guarding or rebound tenderness.    Rectal / Anoscope Exam: Marcelino Duster Chaperone present): Several Small external hemorrhoids are present, not swollen, not tender, not thrombosed.  Normal anal sphincter tone.  No rectal masses or tenderness.  There are 2 small internal purple hemorrhoids present.  No visible bleeding.  Imaging Studies: No results found.  Assessment and Plan:   Nancy Freeman is a 52 y.o. y/o female has been referred for:   1.  Fecal incontinence / Rectal Spasm  Rx diltiazem/lidocaine cream apply twice daily to rectum as needed, number 30 g, 1 refill.  Rx sent to [facility] compounding [facility].  Recommend she start OTC FiberCon tablets or fiber Gummies daily.  Take OTC MiraLAX powder 1 capful daily as needed for constipation.  Recommend high-fiber diet and 64 ounces of fluids daily.  I agree with pelvic floor physical therapy and she has already been referred by her urogynecologist.  2.  Small Grade 1 internal hemorrhoids  Reassurance regarding anoscope exam today.  Normal anal sphincter tone.  No evidence of thrombosed hemorrhoids.  I offered prescription for hydrocortisone suppository and patient declined.  3.  Irritable bowel syndrome with chronic right side abdominal pain  Treatment discussed.  Start align probiotic 1 capsule once daily.  Try low FODMAP diet.  OTC IBgard (peppermint oil) 2 capsules twice daily.  Follow up in 3 months or sooner if worsening GI symptoms.  Celso Sanam, PA-C

## 2023-01-20 ENCOUNTER — Ambulatory Visit (INDEPENDENT_AMBULATORY_CARE_PROVIDER_SITE_OTHER): Payer: BC Managed Care – PPO | Admitting: Physician Assistant

## 2023-01-20 ENCOUNTER — Encounter: Payer: Self-pay | Admitting: Physician Assistant

## 2023-01-20 VITALS — BP 114/75 | HR 62 | Temp 98.3°F | Ht 66.0 in | Wt 158.0 lb

## 2023-01-20 DIAGNOSIS — R159 Full incontinence of feces: Secondary | ICD-10-CM | POA: Diagnosis not present

## 2023-01-20 DIAGNOSIS — K594 Anal spasm: Secondary | ICD-10-CM

## 2023-01-20 DIAGNOSIS — K589 Irritable bowel syndrome without diarrhea: Secondary | ICD-10-CM | POA: Diagnosis not present

## 2023-01-20 DIAGNOSIS — K640 First degree hemorrhoids: Secondary | ICD-10-CM

## 2023-01-20 DIAGNOSIS — K581 Irritable bowel syndrome with constipation: Secondary | ICD-10-CM

## 2023-01-20 DIAGNOSIS — K5904 Chronic idiopathic constipation: Secondary | ICD-10-CM

## 2023-04-16 ENCOUNTER — Ambulatory Visit (INDEPENDENT_AMBULATORY_CARE_PROVIDER_SITE_OTHER): Payer: BC Managed Care – PPO | Admitting: Obstetrics and Gynecology

## 2023-04-16 DIAGNOSIS — Z029 Encounter for administrative examinations, unspecified: Secondary | ICD-10-CM

## 2023-04-22 ENCOUNTER — Telehealth: Payer: Self-pay | Admitting: Physician Assistant

## 2023-04-22 NOTE — Telephone Encounter (Signed)
 The patient called in and left a voicemail requesting to reschedule her appointment with Celso Ashara for April. I call her to let her know that I receive the patient message, and no answer I left her a voicemail to call us back.

## 2023-04-23 ENCOUNTER — Ambulatory Visit: Payer: BC Managed Care – PPO | Admitting: Physician Assistant

## 2023-04-28 NOTE — Progress Notes (Signed)
 Patient not seen during appointment time

## 2023-06-22 ENCOUNTER — Ambulatory Visit: Payer: BC Managed Care – PPO | Attending: Obstetrics and Gynecology | Admitting: Physical Therapy

## 2023-06-22 DIAGNOSIS — M533 Sacrococcygeal disorders, not elsewhere classified: Secondary | ICD-10-CM

## 2023-06-22 DIAGNOSIS — M5459 Other low back pain: Secondary | ICD-10-CM | POA: Diagnosis present

## 2023-06-22 DIAGNOSIS — M546 Pain in thoracic spine: Secondary | ICD-10-CM

## 2023-06-22 DIAGNOSIS — R278 Other lack of coordination: Secondary | ICD-10-CM

## 2023-06-22 DIAGNOSIS — R2689 Other abnormalities of gait and mobility: Secondary | ICD-10-CM | POA: Diagnosis present

## 2023-06-22 NOTE — Therapy (Unsigned)
 OUTPATIENT PHYSICAL THERAPY EVALUATION   Patient Name: KATHLYN PAFFORD MRN: [id number] DOB:[birth year], 53 y.o., female Today's Date: 06/22/2023   PT End of Session - 06/22/23 0946     Visit Number 1    Number of Visits 10    Date for PT Re-Evaluation 08/31/23    PT Start Time 0935    PT Stop Time 1015    PT Time Calculation (min) 40 min    Activity Tolerance Patient tolerated treatment well;No increased pain    Behavior During Therapy [facility] for tasks assessed/performed             Past Medical History:  Diagnosis Date   ADHD (attention deficit hyperactivity disorder)    ADHD   Allergy    Anginal pain (HCC)    Normal TTE and myocardial perfusion scan 04/24/20   Anxiety    BRCA negative 08/2019   MyRisk neg   Family history of breast cancer 08/2019   IBIS=14.3%/riskscore=14.9%   Family history of ovarian cancer    GERD (gastroesophageal reflux disease)    Headache    migraines  1-2x/month   Hidradenoma VULVAR   Hypertension    IBS (irritable bowel syndrome)    Internal hemorrhoid    Past Surgical History:  Procedure Laterality Date   ABDOMINAL HYSTERECTOMY  01-19-2001  DR Ola Berger   W/ LEFT SALPINGO-OOPHORECTOMY AND EXCISION ENDOMETRIOSIS   BICEPT TENODESIS Right 03/20/2022   Procedure: OPEN BICEPS TENODESIS;  Surgeon: Jasmine Mesi, MD;  Location: [facility];  Service: Orthopedics;  Laterality: Right;   BREAST BIOPSY Left 09/22/2017   affirm bx, pash   CESAREAN SECTION     COLONOSCOPY     COLONOSCOPY WITH PROPOFOL  N/A 05/10/2020   Procedure: COLONOSCOPY WITH PROPOFOL ;  Surgeon: Marnee Sink, MD;  Location: [facility];  Service: Endoscopy;  Laterality: N/A;   ESOPHAGOGASTRODUODENOSCOPY (EGD) WITH PROPOFOL  N/A 05/10/2020   Procedure: ESOPHAGOGASTRODUODENOSCOPY (EGD) WITH PROPOFOL ;  Surgeon: Marnee Sink, MD;  Location: [facility];  Service: Endoscopy;  Laterality: N/A;   HEMORRHOID BANDING  2018   POLYPECTOMY N/A 05/10/2020    Procedure: POLYPECTOMY;  Surgeon: Marnee Sink, MD;  Location: [facility];  Service: Endoscopy;  Laterality: N/A;   REMOVAL OF SIDE PLATE, DISTAL FIBULA, RIGHT ANKLE  05-08-2004   SHOULDER ARTHROSCOPY Right 09/03/2022   Procedure: RIGHT SHOULDER ARTHROSCOPY WITH ROTATOR INTERVAL RELEASE;  Surgeon: Jasmine Mesi, MD;  Location: [facility];  Service: Orthopedics;  Laterality: Right;   SHOULDER ARTHROSCOPY WITH OPEN ROTATOR CUFF REPAIR AND DISTAL CLAVICLE ACROMINECTOMY Right 03/20/2022   Procedure: RIGHT SHOULDER ARTHROSCOPY, POSTERIOR LABRAL REPAIR, DEBRIDEMENT, ARTHROSCOPIC DISTAL CLAVICLE EXCISION,;  Surgeon: Jasmine Mesi, MD;  Location: [facility];  Service: Orthopedics;  Laterality: Right;   SHOULDER CLOSED REDUCTION Right 09/03/2022   Procedure: MANIPULATION UNDER ANESTHESIA;  Surgeon: Jasmine Mesi, MD;  Location:  [facility] OR;  Service: Orthopedics;  Laterality: Right;   UPPER GASTROINTESTINAL ENDOSCOPY     Patient Active Problem List   Diagnosis Date Noted   Incontinence of feces 11/24/2022   Urinary incontinence 11/24/2022   Adhesive capsulitis of right shoulder 09/19/2022   Arthritis of right acromioclavicular joint 03/22/2022   Degenerative superior labral anterior-to-posterior (SLAP) tear of right shoulder 03/22/2022   Biceps tendonitis on right 03/22/2022   Tear of right glenoid labrum 03/22/2022   Chronic right shoulder pain 01/19/2022   History of colonic polyps    Polyp of sigmoid colon    Gastroesophageal reflux disease without  esophagitis    BRCA negative 10/13/2019   Family history of ovarian cancer 09/18/2019   Prolapsed internal hemorrhoids, grade 2 05/19/2016   PCP: Jefferey Minerva -[place]   REFERRING PROVIDER: [facility],   REFERRING DIAG:  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  R15.1 (ICD-10-CM) - Fecal smearing  N39.41 (ICD-10-CM) - Urge incontinence of urine  R35.0 (ICD-10-CM) - Urinary frequency  N32.81 (ICD-10-CM) - OAB (overactive  bladder)  M62.89 (ICD-10-CM) - Pelvic floor dysfunction  M62.838 (ICD-10-CM) - Levator spasm    Rationale for Evaluation and Treatment Rehabilitation  THERAPY DIAG:  Sacrococcygeal disorders, not elsewhere classified  Other abnormalities of gait and mobility  Other lack of coordination  Pain in thoracic spine  Other low back pain  ONSET DATE:   SUBJECTIVE:                                                                                                                                                                                           SUBJECTIVE STATEMENT: 1_ fecal smearing:  occurs 3 x week mostly in the evening after sitting  , changes pads 2-3 x day . Pt has noticed a decreased in fecal leakage after changing to a Mediterrean Diet and removed caffeine for the past 4 months. Pt also acupuncture visits for TMJ and LBP for incontinence issues for 16 visits from Feb - April with Dr. Ruther Cower ( chiropractor).  Prior to these Tx and dietary changes , pt was having fecal leakage during the night and during the day. And going thru 3-5 pads. Daily fluid : 96 fl oz of water  and no alcohol/ coffee. Juice only 2 x week . Denied straining with bowel movements, Uses a bidet.  Daily BMS , once a day mostly with Pt has stool consistency Type 4-5 .   Pt gets rectal spasms at anal sphincter after sitting for long periods like recent car trip. Pt used get these rectal spasms in the past 6 month before she changed her diet   2_LBP non radiating pain:    Sitting for > 1 hr at computer  and church causes LBP 5-6/10 . Pt had this LBP for past 15-20 years.   3_ thoracic pain without radiating pain : Standing  at sink and counter with food prep and cooking 10-15 min   cause thoracic pain 6-7/10 . Pt had this thoracic pain for last 10 years   4.  Urinary leakage occurs sneezing and coughing.    PERTINENT HISTORY:  1 C section delivery with only child, abdominal hysterectomy  2/2 endometriosis 2002 ,  laparoscopic surgeries for endometriosis, Dx with endometriosis 2000 Tavlov cysts  (  S1-2)    PAIN:  Are you having pain? See above   PRECAUTIONS: None  WEIGHT BEARING RESTRICTIONS: No  FALLS:  Has patient fallen in last 6 months? 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 scar restrictions   ACTIVITY LIMITATIONS  self-care,  home chores, work tasks    PARTICIPATION LIMITATIONS:  community, farming activities and Recruitment consultant activities    PERSONAL FACTORS   see pertinent Hx with surgeries above /  post retiurement plans to take for farm with strenuous physical activities are also affecting patient's functional outcome.    REHAB POTENTIAL: Good   CLINICAL DECISION MAKING: Evolving/moderate complexity   EVALUATION COMPLEXITY: Moderate    PATIENT EDUCATION:    Education details: Showed pt anatomy images. Explained muscles attachments/ connection, physiology of deep core system/ spinal- thoracic-pelvis-lower kinetic chain as they relate to pt's presentation, Sx, and past Hx. Explained what and how these areas of deficits  need to be restored to balance and function    See Therapeutic activity / neuromuscular re-education section  Answered pt's questions.   Person educated: Patient Education method: Explanation, Demonstration, Tactile cues, Verbal cues, and Handouts Education comprehension: verbalized understanding, returned demonstration, verbal cues required, tactile cues required, and needs further education     PLAN: PT FREQUENCY: 1x/week   PT DURATION: 10 weeks   PLANNED INTERVENTIONS: Therapeutic exercises, Therapeutic activity, Neuromuscular re-education, Balance training, Gait training, Patient/Family education, Self Care, Joint mobilization, Spinal mobilization, Moist heat, Taping, and Manual therapy, dry needling.   PLAN FOR NEXT SESSION: See clinical impression for plan     GOALS: Goals reviewed with patient? Yes  SHORT TERM GOALS: Target date: 07/20/2023    Pt will demo IND with HEP                    Baseline: Not IND            Goal status: INITIAL   LONG TERM GOALS: Target date: 08/31/2023    1.Pt will demo proper deep core coordination without chest breathing and optimal excursion of diaphragm/pelvic floor in order to promote spinal stability and pelvic floor function  Baseline: dyscoordination Goal status: INITIAL  2.  Pt will demo proper body mechanics in against gravity tasks and ADLs  farm work  tasks, fitness  to minimize straining pelvic floor / back  issues  Baseline: not IND, improper form that places strain on pelvic floor  Goal status: INITIAL    3. Pt will demo increased gait speed > 1.3 m/s with reciprocal gait pattern, longer stride length  in order to ambulate safely in community and return to fitness routine  Baseline:  Goal status: INITIAL  4. Pt will demo levelled pelvic girdle and shoulder height in order to progress to deep core strengthening HEP and restore mobility at spine, pelvis, gait, posture minimize falls, and improve balance   Baseline: R  shoulder and R iliac crest lowered  Goal status: INITIAL   5. Pt will report being able to prep food and wash dishes for > 30 min with < 2/10 thoracic pain and to be able to sit through church / computer > 1.5 hr with < 2/10 LBP  in order to return community and social activities  Baseline:  Sitting for > 1 hr at computer  and church causes LBP 5-6/10  Standing  at sink and counter with food prep and cooking 10-15 min   cause thoracic pain 6-7/10   Goal status: INITIAL   6. Pt will improve PFDI-7 questionnaire to >10   pts  score change in any categories  to demo improved QOL  Baseline:  125 pts   ( greater pts indicate greater negative impact on QOL)                    38 for UIQ-7 ,  43 for both CRAIQ-7 and POPIQ-7  Goal Status: INITIAL       Modesto Andreas, PT 06/22/2023, 9:57 AM

## 2023-06-23 NOTE — Patient Instructions (Signed)
  Proper body mechanics with getting out of a chair to decrease strain  on back &pelvic floor   Avoid holding your breath when Getting out of the chair:  Scoot to front part of chair chair Heels behind knees, feet are hip width apart, nose over toes  Inhale like you are smelling roses Exhale to stand   __  .sitting with legs and feet uncrossed, planted on ground

## 2023-06-29 ENCOUNTER — Ambulatory Visit: Payer: BC Managed Care – PPO | Attending: Obstetrics and Gynecology | Admitting: Physical Therapy

## 2023-06-29 DIAGNOSIS — M546 Pain in thoracic spine: Secondary | ICD-10-CM | POA: Diagnosis present

## 2023-06-29 DIAGNOSIS — R2689 Other abnormalities of gait and mobility: Secondary | ICD-10-CM | POA: Diagnosis present

## 2023-06-29 DIAGNOSIS — M5459 Other low back pain: Secondary | ICD-10-CM | POA: Insufficient documentation

## 2023-06-29 DIAGNOSIS — R278 Other lack of coordination: Secondary | ICD-10-CM | POA: Diagnosis present

## 2023-06-29 DIAGNOSIS — M533 Sacrococcygeal disorders, not elsewhere classified: Secondary | ICD-10-CM | POA: Insufficient documentation

## 2023-06-29 NOTE — Therapy (Signed)
 OUTPATIENT PHYSICAL THERAPY TREATMENT    Patient Name: Nancy Freeman MRN: [id number] DOB:05/01/1970, 53 y.o., female Today's Date: 06/29/2023   PT End of Session - 06/29/23 0933     Visit Number 2    Number of Visits 10    Date for PT Re-Evaluation 08/31/23    PT Start Time 0935    PT Stop Time 1015    PT Time Calculation (min) 40 min    Activity Tolerance Patient tolerated treatment well;No increased pain    Behavior During Therapy [facility] for tasks assessed/performed             Past Medical History:  Diagnosis Date   ADHD (attention deficit hyperactivity disorder)    ADHD   Allergy    Anginal pain (HCC)    Normal TTE and myocardial perfusion scan 04/24/20   Anxiety    BRCA negative 08/2019   MyRisk neg   Family history of breast cancer 08/2019   IBIS=14.3%/riskscore=14.9%   Family history of ovarian cancer    GERD (gastroesophageal reflux disease)    Headache    migraines  1-2x/month   Hidradenoma VULVAR   Hypertension    IBS (irritable bowel syndrome)    Internal hemorrhoid    Past Surgical History:  Procedure Laterality Date   ABDOMINAL HYSTERECTOMY  01-19-2001  DR Ola Berger   W/ LEFT SALPINGO-OOPHORECTOMY AND EXCISION ENDOMETRIOSIS   BICEPT TENODESIS Right 03/20/2022   Procedure: OPEN BICEPS TENODESIS;  Surgeon: Jasmine Mesi, MD;  Location: [facility];  Service: Orthopedics;  Laterality: Right;   BREAST BIOPSY Left 09/22/2017   affirm bx, pash   CESAREAN SECTION     COLONOSCOPY     COLONOSCOPY WITH PROPOFOL  N/A 05/10/2020   Procedure: COLONOSCOPY WITH PROPOFOL ;  Surgeon: Marnee Sink, MD;  Location: [facility];  Service: Endoscopy;  Laterality: N/A;   ESOPHAGOGASTRODUODENOSCOPY (EGD) WITH PROPOFOL  N/A 05/10/2020   Procedure: ESOPHAGOGASTRODUODENOSCOPY (EGD) WITH PROPOFOL ;  Surgeon: Marnee Sink, MD;  Location: [facility];  Service: Endoscopy;  Laterality: N/A;   HEMORRHOID BANDING  2018   POLYPECTOMY N/A 05/10/2020    Procedure: POLYPECTOMY;  Surgeon: Marnee Sink, MD;  [facility];  Service: Endoscopy;  Laterality: N/A;   REMOVAL OF SIDE PLATE, DISTAL FIBULA, RIGHT ANKLE  05-08-2004   SHOULDER ARTHROSCOPY Right 09/03/2022   Procedure: RIGHT SHOULDER ARTHROSCOPY WITH ROTATOR INTERVAL RELEASE;  Surgeon: Jasmine Mesi, MD;  Location: [facility] OR;  Service: Orthopedics;  Laterality: Right;   SHOULDER ARTHROSCOPY WITH OPEN ROTATOR CUFF REPAIR AND DISTAL CLAVICLE ACROMINECTOMY Right 03/20/2022   Procedure: RIGHT SHOULDER ARTHROSCOPY, POSTERIOR LABRAL REPAIR, DEBRIDEMENT, ARTHROSCOPIC DISTAL CLAVICLE EXCISION,;  Surgeon: Jasmine Mesi, MD;  Location: [facility];  Service: Orthopedics;  Laterality: Right;   SHOULDER CLOSED REDUCTION Right 09/03/2022   Procedure: MANIPULATION UNDER ANESTHESIA;  Surgeon: Jasmine Mesi, MD;  Location: [facility] OR;  Service: Orthopedics;  Laterality: Right;   UPPER GASTROINTESTINAL ENDOSCOPY     Patient Active Problem List   Diagnosis Date Noted   Incontinence of feces 11/24/2022   Urinary incontinence 11/24/2022   Adhesive capsulitis of right shoulder 09/19/2022   Arthritis of right acromioclavicular joint 03/22/2022   Degenerative superior labral anterior-to-posterior (SLAP) tear of right shoulder 03/22/2022   Biceps tendonitis on right 03/22/2022   Tear of right glenoid labrum 03/22/2022   Chronic right shoulder pain 01/19/2022   History of colonic polyps    Polyp of sigmoid colon    Gastroesophageal reflux disease  without esophagitis    BRCA negative 10/13/2019   Family history of ovarian cancer 09/18/2019   Prolapsed internal hemorrhoids, grade 2 05/19/2016   PCP: Jefferey Minerva -[place]   REFERRING PROVIDER: [facility]uleta,   REFERRING DIAG:  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  R15.1 (ICD-10-CM) - Fecal smearing  N39.41 (ICD-10-CM) - Urge incontinence of urine  R35.0 (ICD-10-CM) - Urinary frequency  N32.81 (ICD-10-CM) - OAB (overactive  bladder)  M62.89 (ICD-10-CM) - Pelvic floor dysfunction  M62.838 (ICD-10-CM) - Levator spasm    Rationale for Evaluation and Treatment Rehabilitation  THERAPY DIAG:  Sacrococcygeal disorders, not elsewhere classified  Pain in thoracic spine  Other abnormalities of gait and mobility  Other low back pain  Other lack of coordination  ONSET DATE:   SUBJECTIVE:                SUBJECTIVE STATEMENT TODAY:                                                           Pt is more awre of her posture, not crossing legs. Pt also noticed she would drive with L leg propped up.                                                                                                                       SUBJECTIVE STATEMENT ON EVAL   06/22/23:  1_ fecal smearing:  occurs 3 x week mostly in the evening after sitting  , changes pads 2-3 x day . Pt has noticed a decreased in fecal leakage after changing to a Mediterrean Diet and removed caffeine for the past 4 months. Pt also acupuncture visits for TMJ and LBP for incontinence issues for 16 visits from Feb - April with Dr. Ruther Cower ( chiropractor).  Prior to these Tx and dietary changes , pt was having fecal leakage during the night and during the day. And going thru 3-5 pads. Daily fluid : 96 fl oz of water  and no alcohol/ coffee. Juice only 2 x week . Denied straining with bowel movements, Uses a bidet.  Daily BMS , once a day mostly with Pt has stool consistency Type 4-5 .   Pt gets rectal spasms at anal sphincter after sitting for long periods like recent car trip. Pt used get these rectal spasms in the past 6 month before she changed her diet   2_LBP non radiating pain:    Sitting for > 1 hr at computer  and church causes LBP 5-6/10 . Pt had this LBP for past 15-20 years.   3_ thoracic pain without radiating pain : Standing  at sink and counter with food prep and cooking 10-15 min   cause thoracic pain 6-7/10 . Pt had this thoracic pain for last 10 years    4.  Urinary  leakage occurs sneezing and coughing.    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 See above   PRECAUTIONS: None  WEIGHT BEARING RESTRICTIONS: No  FALLS:  Has patient fallen in last 6 months? 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, feeding animals with hay, mending fences     OBJECTIVE:    [facility] PT Assessment - 06/29/23 0939       Strength   Overall Strength Comments B hip abd 2/5      Palpation   Spinal mobility T3 deviated to R, T8 to L    SI assessment  R iliac crest and R shoulder higher standing, supine: L low rib, ASIS to medial malleoli    Palpation comment tight paraspinals, intercostals at throacic R region             [facility] Adult PT Treatment/Exercise - 06/29/23 1057       Therapeutic Activites    Other Therapeutic Activities advised to not lean on car console to minimize asymmetries to spine   Avoid sleeping on lots of pillows, practice sleeping on one pillow on her back and when sleeping on side, have pilow between knees      Neuro Re-ed    Neuro Re-ed Details  cued for aysmterical stretches , proper body mechanics with log rolling, sitting, sit to stand mechanics,      Modalities   Modalities Moist Heat      Moist Heat Therapy   Number Minutes Moist Heat 5 Minutes    Moist Heat Location --   guided relaxaiton on back with knees supported ( unbilled)      Manual Therapy   Manual therapy comments STM/MWM at problem areas noted in assessment                   HOME EXERCISE PROGRAM: See pt instruction section    ASSESSMENT:  CLINICAL IMPRESSION:     Addressed misalignment of spine/ pelvis today with manual Tx, technique modified to accomodates pt's comfort. Pt demo'd imrpoved mobility at thoracic spine with less deviated segments. However, R shoulder and iliac crest remained higher post Tx. Plan to further assess next session to confirm leg length difference.   Cued for stretches to address spinal deviations, cued for sleeping on her back, avoid sleeping on lots of pillows, practice sleeping on one pillow on her back and when sleeping on side, have pilwo between knees, advised to not lean on car console to minimize asymmetries to spine . Anticipate these techniques will help minimize deviated segments to spine.    Cued for proper body mechanics with log rolling, sitting, sit to stand mechanics to minimize straining pelvic floor and abdominal.   Anticipate these improvements today will help promote optimize IAP system for improved pelvic floor function, trunk stability, gait, balance, stabilization with mobility tasks.  Plan to address pelvic floor issues once pelvis and spine are realigned to yield better outcomes.  Regional interdependent approaches will yield greater benefits .  Pt benefits from skilled PT.     OBJECTIVE IMPAIRMENTS decreased activity tolerance, decreased coordination, decreased endurance, decreased mobility, difficulty walking, decreased ROM, decreased strength, decreased safety awareness, hypomobility, increased muscle spasms, impaired flexibility, improper body mechanics, postural dysfunction, and pain. scar restrictions   ACTIVITY LIMITATIONS  self-care,  home chores, work tasks    PARTICIPATION LIMITATIONS:  community, farming activities and Recruitment consultant activities    PERSONAL FACTORS   see pertinent Hx with surgeries above /  post retiurement plans to take for farm with strenuous physical  activities are also affecting patient's functional outcome.    REHAB POTENTIAL: Good   CLINICAL DECISION MAKING: Evolving/moderate complexity   EVALUATION COMPLEXITY: Moderate    PATIENT EDUCATION:    Education details: Showed pt anatomy images. Explained muscles attachments/ connection, physiology of deep core system/ spinal- thoracic-pelvis-lower kinetic chain as they relate to pt's presentation, Sx, and past Hx. Explained what and how these areas of deficits need to be restored to balance and function    See Therapeutic activity / neuromuscular re-education section  Answered pt's questions.   Person educated: Patient Education method: Explanation, Demonstration, Tactile cues, Verbal cues, and Handouts Education comprehension: verbalized understanding, returned demonstration, verbal cues required, tactile cues required, and needs further education     PLAN: PT FREQUENCY: 1x/week   PT DURATION: 10 weeks   PLANNED INTERVENTIONS: Therapeutic exercises, Therapeutic activity, Neuromuscular re-education, Balance training, Gait training, Patient/Family education, Self Care, Joint mobilization, Spinal mobilization, Moist heat, Taping, and Manual therapy, dry needling.   PLAN FOR NEXT SESSION: See clinical impression for plan     GOALS: Goals reviewed with patient? Yes  SHORT TERM GOALS: Target date: 07/20/2023    Pt will demo IND with HEP                    Baseline: Not IND            Goal status: INITIAL   LONG TERM GOALS: Target date: 08/31/2023    1.Pt will demo proper deep core coordination without chest breathing and optimal excursion of diaphragm/pelvic floor in order to promote spinal stability and pelvic floor function  Baseline: dyscoordination Goal status: INITIAL  2.  Pt will demo proper body mechanics in against gravity tasks and ADLs  farm work  tasks, fitness  to minimize straining pelvic floor / back  issues  Baseline: not IND, improper form that places  strain on pelvic floor  Goal status: INITIAL    3. Pt will demo increased gait speed > 1.5 m/s with reciprocal gait pattern, longer stride length  in order to ambulate safely in community and return to fitness routine  Baseline: 1.4 m/s, limited arm swings, limited posterior L thoracic rotation, excessive pelvic sway to L, abducted feet, ,  Goal status: INITIAL    4. Pt will demo levelled pelvic girdle and shoulder height  and to demo equal downward movement of scapula in order to progress to deep core strengthening HEP and restore mobility at spine, pelvis, gait, posture minimize falls, and improve balance   Baseline: R shoulder and R iliac crest lowered , scapular downward movement : L delayed  Goal status: INITIAL   5. Pt will report being able to prep food and wash dishes for > 30 min with < 2/10 thoracic pain and to be able to sit through church / computer > 1.5 hr with < 2/10 LBP  in order to return community and social activities  Baseline:  Sitting  for > 1 hr at computer  and church causes LBP 5-6/10  Standing  at sink and counter with food prep and cooking 10-15 min   cause thoracic pain 6-7/10  Goal status: INITIAL   6. Pt will improve PFDI-7 questionnaire to >10   pts  score change in any categories  to demo improved QOL  Baseline:  125 pts   ( greater pts indicate greater negative impact on QOL)                    38 for UIQ-7 ,  43 for both CRAIQ-7 and POPIQ-7  Goal Status: INITIAL        Mod[place] Andreas, PT 06/29/2023, 9:34 AM

## 2023-06-29 NOTE — Patient Instructions (Signed)
  Avoid straining pelvic floor, abdominal muscles , spine  Use log rolling technique instead of getting out of bed with your neck or the sit-up     Log rolling into and out of bed   Log rolling into and out of bed If getting out of bed on R side, Bent knees, scoot hips/ shoulder to L  Raise R arm completely overhead, rolling onto armpit  Then lower bent knees to bed to get into complete side lying position  Then drop legs off bed, and push up onto R elbow/forearm, and use L hand to push onto the bed    Dig elbows and feet to lift hte buttocks and scoot without lifting head   __    Lengthen Back rib by R  shoulder   ( winging)    Lie on L  side , pillow between knees and under head  Pull  R arm overhead over mattress, grab the edge of mattress,pull it upward, drawing elbow away from ears  Breathing 10 reps  Brushing arm with 3/4 turn onto pillow behind back  Lying on L  side ,Pillow/ Block between knees     dragging top forearm across ribs below breast rotating 3/4 turn,  rotating  _R_ only this week ,  relax onto the pillow behind the back  and then back to other palm , maintain top palm on body whole top and not lift shoulder  Do this side this week       Wait do both sides until we have levelled out your spine and shoulders  ___ ZigZag stretch  Reclined twist for hips and side of the hips/ legs  Lay on your back, knees bend, dig elbows and feet into bed to exhale and lift buttocks up to  Scoot hips to the L , leave shoulders in place Wobble knees to the R side 45 deg and to midline  10 reps      ___  Sleep on back  ___  Avoid leaning on console in car to minimize asymmetries of pelvic and spine

## 2023-07-06 ENCOUNTER — Ambulatory Visit: Payer: BC Managed Care – PPO | Admitting: Physical Therapy

## 2023-07-06 DIAGNOSIS — M546 Pain in thoracic spine: Secondary | ICD-10-CM

## 2023-07-06 DIAGNOSIS — M533 Sacrococcygeal disorders, not elsewhere classified: Secondary | ICD-10-CM

## 2023-07-06 DIAGNOSIS — M5459 Other low back pain: Secondary | ICD-10-CM

## 2023-07-06 DIAGNOSIS — R278 Other lack of coordination: Secondary | ICD-10-CM

## 2023-07-06 DIAGNOSIS — R2689 Other abnormalities of gait and mobility: Secondary | ICD-10-CM

## 2023-07-06 NOTE — Patient Instructions (Addendum)
 Minisquat: Scoot buttocks back slight, hinge like you are looking at your reflection on a pond  Knees behind toes,  Inhale to "smell flowers"  Exhale on the rise "like rocket"  Do not lock knees, have more weight across ballmounds of feet, toes relaxed and spread them, not grip them   10 reps x 3 x day    __  Stretches :   Neck / shoulder stretches:    Lying on back - small sushi roll towel under neck  _ 6 directions   Chin up, down Rotation like changing lanes when driving Ear to shoulder like puppy dog   10 reps    _angel wings, lower elbows down , keep arms touching bed  10 reps    ____________

## 2023-07-06 NOTE — Therapy (Signed)
 OUTPATIENT PHYSICAL THERAPY TREATMENT     Patient Name: Nancy Freeman MRN: [id number] DOB:10-Nov-1970, 53 y.o., female Today's Date: 07/06/2023   PT End of Session - 07/06/23 0943     Visit Number 3    Number of Visits 10    Date for PT Re-Evaluation 08/31/23    PT Start Time 0940    PT Stop Time 1020    PT Time Calculation (min) 40 min    Activity Tolerance Patient tolerated treatment well;No increased pain    Behavior During Therapy [facility] for tasks assessed/performed             Past Medical History:  Diagnosis Date   ADHD (attention deficit hyperactivity disorder)    ADHD   Allergy    Anginal pain (HCC)    Normal TTE and myocardial perfusion scan 04/24/20   Anxiety    BRCA negative 08/2019   MyRisk neg   Family history of breast cancer 08/2019   IBIS=14.3%/riskscore=14.9%   Family history of ovarian cancer    GERD (gastroesophageal reflux disease)    Headache    migraines  1-2x/month   Hidradenoma VULVAR   Hypertension    IBS (irritable bowel syndrome)    Internal hemorrhoid    Past Surgical History:  Procedure Laterality Date   ABDOMINAL HYSTERECTOMY  01-19-2001  DR Ola Berger   W/ LEFT SALPINGO-OOPHORECTOMY AND EXCISION ENDOMETRIOSIS   BICEPT TENODESIS Right 03/20/2022   Procedure: OPEN BICEPS TENODESIS;  Surgeon: Jasmine Mesi, MD;  Location: [facility];  Service: Orthopedics;  Laterality: Right;   BREAST BIOPSY Left 09/22/2017   affirm bx, pash   CESAREAN SECTION     COLONOSCOPY     COLONOSCOPY WITH PROPOFOL  N/A 05/10/2020   Procedure: COLONOSCOPY WITH PROPOFOL ;  Surgeon: Marnee Sink, MD;  Location: [facility];  Service: Endoscopy;  Laterality: N/A;   ESOPHAGOGASTRODUODENOSCOPY (EGD) WITH PROPOFOL  N/A 05/10/2020   Procedure: ESOPHAGOGASTRODUODENOSCOPY (EGD) WITH PROPOFOL ;  Surgeon: Marnee Sink, MD;  Location: [facility];  Service: Endoscopy;  Laterality: N/A;   HEMORRHOID BANDING  2018   POLYPECTOMY N/A 05/10/2020    Procedure: POLYPECTOMY;  Surgeon: Marnee Sink, MD;  Location: [facility];  Service: Endoscopy;  Laterality: N/A;   REMOVAL OF SIDE PLATE, DISTAL FIBULA, RIGHT ANKLE  05-08-2004   SHOULDER ARTHROSCOPY Right 09/03/2022   Procedure: RIGHT SHOULDER ARTHROSCOPY WITH ROTATOR INTERVAL RELEASE;  Surgeon: Jasmine Mesi, MD;  Location: [facility];  Service: Orthopedics;  Laterality: Right;   SHOULDER ARTHROSCOPY WITH OPEN ROTATOR CUFF REPAIR AND DISTAL CLAVICLE ACROMINECTOMY Right 03/20/2022   Procedure: RIGHT SHOULDER ARTHROSCOPY, POSTERIOR LABRAL REPAIR, DEBRIDEMENT, ARTHROSCOPIC DISTAL CLAVICLE EXCISION,;  Surgeon: Jasmine Mesi, MD;  Location: [facility];  Service: Orthopedics;  Laterality: Right;   SHOULDER CLOSED REDUCTION Right 09/03/2022   Procedure: MANIPULATION UNDER ANESTHESIA;  Surgeon: Jasmine Mesi, MD;  Location: [facility] OR;  Service: Orthopedics;  Laterality: Right;   UPPER GASTROINTESTINAL ENDOSCOPY     Patient Active Problem List   Diagnosis Date Noted   Incontinence of feces 11/24/2022   Urinary incontinence 11/24/2022   Adhesive capsulitis of right shoulder 09/19/2022   Arthritis of right acromioclavicular joint 03/22/2022   Degenerative superior labral anterior-to-posterior (SLAP) tear of right shoulder 03/22/2022   Biceps tendonitis on right 03/22/2022   Tear of right glenoid labrum 03/22/2022   Chronic right shoulder pain 01/19/2022   History of colonic polyps    Polyp of sigmoid colon    Gastroesophageal reflux  disease without esophagitis    BRCA negative 10/13/2019   Family history of ovarian cancer 09/18/2019   Prolapsed internal hemorrhoids, grade 2 05/19/2016   PCP: Jefferey Minerva -[place]   REFERRING PROVIDER: Zuleta,   REFERRING DIAG:  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  R15.1 (ICD-10-CM) - Fecal smearing  N39.41 (ICD-10-CM) - Urge incontinence of urine  R35.0 (ICD-10-CM) - Urinary frequency  N32.81 (ICD-10-CM) - OAB (overactive  bladder)  M62.89 (ICD-10-CM) - Pelvic floor dysfunction  M62.838 (ICD-10-CM) - Levator spasm    Rationale for Evaluation and Treatment Rehabilitation  THERAPY DIAG:  Sacrococcygeal disorders, not elsewhere classified  Pain in thoracic spine  Other abnormalities of gait and mobility  Other low back pain  Other lack of coordination  ONSET DATE:   SUBJECTIVE:                SUBJECTIVE STATEMENT TODAY:                                                           Pt is more aware of her posture and not leaning on her car console. Pt has transitioned to one pillow instead of two pillows.   The exercises stretches have felt like a workout for her rib area but not painful. Pt feels soreness and achiness between the shoulder blades.    SUBJECTIVE STATEMENT ON EVAL   06/22/23:  1_ fecal smearing:  occurs 3 x week mostly in the evening after sitting  , changes pads 2-3 x day . Pt has noticed a decreased in fecal leakage after changing to a Mediter[place] Diet and removed caffeine for the past 4 months. Pt also acupuncture visits for TMJ and LBP for incontinence issues for 16 visits from Feb - April with Dr. Ruther Cower ( chiropractor).  Prior to these Tx and dietary changes , pt was having fecal leakage during the night and during the day. And going thru 3-5 pads. Daily fluid : 96 fl oz of water  and no alcohol/ coffee. Juice only 2 x week . Denied straining with bowel movements, Uses a bidet.  Daily BMS , once a day mostly with Pt has stool consistency Type 4-5 .   Pt gets rectal spasms at anal sphincter after sitting for long periods like recent car trip. Pt used get these rectal spasms in the past 6 month before she changed her diet   2_LBP non radiating pain:    Sitting for > 1 hr at computer  and church causes LBP 5-6/10 . Pt had this LBP for past 15-20 years.   3_ thoracic pain without radiating pain : Standing  at sink and counter with food prep and cooking 10-15 min   cause thoracic pain  6-7/10 . Pt had this thoracic pain for last 10 years   4.  Urinary leakage occurs sneezing and coughing.    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 See above   PRECAUTIONS: None  WEIGHT BEARING RESTRICTIONS: No  FALLS:  Has patient fallen in last 6 months? 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, feeding animals with hay, mending fences     OBJECTIVE:    [facility] PT Assessment - 07/06/23 0944       Squat   Comments simulated bending with gardening: locked knees,      Palpation   SI assessment  levelled shoulder and pelvis    Palpation comment tightness medial scapular area/ B occiput, neck mm             [facility] Adult PT Treatment/Exercise - 07/06/23 0951       Neuro Re-ed    Neuro Re-ed Details  cued for logrolling and scooting in bed without strianing neck./ back, pelvic floor , cued for stretches to ffurther promote mobility and upright posture      Modalities   Modalities Moist Heat      Moist Heat Therapy   Number Minutes Moist Heat 5 Minutes    Moist Heat Location --   C/T junction ( unbilled)     Manual Therapy   Manual therapy comments STM/MWM at problem areas noted in assessment to improve C/T mobility, scapular mobility                  HOME EXERCISE PROGRAM: See pt instruction section    ASSESSMENT:  CLINICAL IMPRESSION:  Pt showed good carry over from last session with levelled pelvis and shoulder. Pt is compliant with not leaning onto console in car which  will help with maintaining levelled spine and pelvis. Further addressed hypomobility at C/T junction and scapular region. Pt has Hx of shoulder/ clavicle / acromium surgeries which is likely related hypomobility in C/T / scapular area. Pt demo'd improved mobility in these areas after manual Tx.  Cued for stretches to address C/T junction and scapular  hypomobility. Anticipate these techniques will help minimize deviated segments to spine.   Anticipate these improvements today will help promote upright posture and optimize IAP system for improved pelvic floor function, trunk stability, gait, balance, stabilization with mobility tasks.  Plan to address pelvic floor issues once pelvis and spine are realigned to yield better outcomes.  Regional interdependent approaches will yield greater benefits .     Pt benefits from skilled PT.     OBJECTIVE IMPAIRMENTS decreased activity tolerance, decreased coordination, decreased endurance, decreased mobility, difficulty walking, decreased ROM, decreased strength, decreased safety awareness, hypomobility, increased muscle spasms, impaired flexibility, improper body mechanics, postural dysfunction, and pain. scar restrictions   ACTIVITY LIMITATIONS  self-care,  home chores, work tasks    PARTICIPATION LIMITATIONS:  community, farming activities and Recruitment consultant activities    PERSONAL FACTORS   see pertinent Hx with surgeries above /  post retiurement plans to take for farm with strenuous physical activities are also affecting patient's functional outcome.    REHAB POTENTIAL: Good   CLINICAL DECISION MAKING: Evolving/moderate complexity   EVALUATION COMPLEXITY: Moderate    PATIENT EDUCATION:    Education details: Showed pt anatomy images. Explained muscles attachments/ connection, physiology of deep core system/ spinal- thoracic-pelvis-lower kinetic chain as they relate to pt's presentation, Sx, and past Hx. Explained what and how these areas of  deficits need to be restored to balance and function    See Therapeutic activity / neuromuscular re-education section  Answered pt's questions.  Person educated: Patient Education method: Explanation, Demonstration, Tactile cues, Verbal cues, and Handouts Education comprehension: verbalized understanding, returned demonstration, verbal cues required, tactile cues required, and needs further education     PLAN: PT FREQUENCY: 1x/week   PT DURATION: 10 weeks   PLANNED INTERVENTIONS: Therapeutic exercises, Therapeutic activity, Neuromuscular re-education, Balance training, Gait training, Patient/Family education, Self Care, Joint mobilization, Spinal mobilization, Moist heat, Taping, and Manual therapy, dry needling.   PLAN FOR NEXT SESSION: See clinical impression for plan     GOALS: Goals reviewed with patient? Yes  SHORT TERM GOALS: Target date: 07/20/2023    Pt will demo IND with HEP                    Baseline: Not IND            Goal status: INITIAL   LONG TERM GOALS: Target date: 08/31/2023    1.Pt will demo proper deep core coordination without chest breathing and optimal excursion of diaphragm/pelvic floor in order to promote spinal stability and pelvic floor function  Baseline: dyscoordination Goal status: INITIAL  2.  Pt will demo proper body mechanics in against gravity tasks and ADLs  farm work  tasks, fitness  to minimize straining pelvic floor / back  issues  Baseline: not IND, improper form that places strain on pelvic floor  Goal status: INITIAL    3. Pt will demo increased gait speed > 1.5 m/s with reciprocal gait pattern, longer stride length  in order to ambulate safely in community and return to fitness routine  Baseline: 1.4 m/s, limited arm swings, limited posterior L thoracic rotation, excessive pelvic sway to L, abducted feet, ,  Goal status: INITIAL    4. Pt will demo levelled pelvic girdle and shoulder height  and to demo equal downward  movement of scapula in order to progress to deep core strengthening HEP and restore mobility at spine, pelvis, gait, posture minimize falls, and improve balance   Baseline: R shoulder and R iliac crest lowered , scapular downward movement : L delayed  Goal status: INITIAL   5. Pt will report being able to prep food and wash dishes for > 30 min with < 2/10 thoracic pain and to be able to sit through church / computer > 1.5 hr with < 2/10 LBP  in order to return community and social activities  Baseline:  Sitting for > 1 hr at computer  and church causes LBP 5-6/10  Standing  at sink and counter with food prep and cooking 10-15 min   cause thoracic pain 6-7/10  Goal status: INITIAL   6. Pt will improve PFDI-7 questionnaire to >10   pts  score change in any categories  to demo improved QOL  Baseline:  125 pts   ( greater pts indicate greater negative impact on QOL)                    38 for UIQ-7 ,  43 for both CRAIQ-7 and POPIQ-7  Goal Status: INITIAL        Modesto Andreas, PT 07/06/2023, 9:48 AM

## 2023-07-27 ENCOUNTER — Ambulatory Visit: Payer: BC Managed Care – PPO | Attending: Obstetrics and Gynecology | Admitting: Physical Therapy

## 2023-07-27 DIAGNOSIS — R2689 Other abnormalities of gait and mobility: Secondary | ICD-10-CM | POA: Diagnosis present

## 2023-07-27 DIAGNOSIS — M5459 Other low back pain: Secondary | ICD-10-CM | POA: Insufficient documentation

## 2023-07-27 DIAGNOSIS — R278 Other lack of coordination: Secondary | ICD-10-CM | POA: Insufficient documentation

## 2023-07-27 DIAGNOSIS — M546 Pain in thoracic spine: Secondary | ICD-10-CM | POA: Insufficient documentation

## 2023-07-27 DIAGNOSIS — M533 Sacrococcygeal disorders, not elsewhere classified: Secondary | ICD-10-CM | POA: Insufficient documentation

## 2023-07-27 NOTE — Patient Instructions (Signed)
 Inferior self mob at clavical with cervical extension

## 2023-07-27 NOTE — Therapy (Signed)
 OUTPATIENT PHYSICAL THERAPY TREATMENT     Patient Name: Nancy Freeman MRN: [id number] DOB:11-28-1970, 53 y.o., female Today's Date: 07/27/2023   PT End of Session - 07/27/23 0938     Visit Number 4    Number of Visits 10    Date for PT Re-Evaluation 08/31/23    PT Start Time 0935    PT Stop Time 1015    PT Time Calculation (min) 40 min    Activity Tolerance Patient tolerated treatment well;No increased pain    Behavior During Therapy [facility] for tasks assessed/performed             Past Medical History:  Diagnosis Date   ADHD (attention deficit hyperactivity disorder)    ADHD   Allergy    Anginal pain (HCC)    Normal TTE and myocardial perfusion scan 04/24/20   Anxiety    BRCA negative 08/2019   MyRisk neg   Family history of breast cancer 08/2019   IBIS=14.3%/riskscore=14.9%   Family history of ovarian cancer    GERD (gastroesophageal reflux disease)    Headache    migraines  1-2x/month   Hidradenoma VULVAR   Hypertension    IBS (irritable bowel syndrome)    Internal hemorrhoid    Past Surgical History:  Procedure Laterality Date   ABDOMINAL HYSTERECTOMY  01-19-2001  DR Ola Berger   W/ LEFT SALPINGO-OOPHORECTOMY AND EXCISION ENDOMETRIOSIS   BICEPT TENODESIS Right 03/20/2022   Procedure: OPEN BICEPS TENODESIS;  Surgeon: Jasmine Mesi, MD;  Location: [facility];  Service: Orthopedics;  Laterality: Right;   BREAST BIOPSY Left 09/22/2017   affirm bx, pash   CESAREAN SECTION     COLONOSCOPY     COLONOSCOPY WITH PROPOFOL  N/A 05/10/2020   Procedure: COLONOSCOPY WITH PROPOFOL ;  Surgeon: Marnee Sink, MD;  Location[facility];  Service: Endoscopy;  Laterality: N/A;   ESOPHAGOGASTRODUODENOSCOPY (EGD) WITH PROPOFOL  N/A 05/10/2020   Procedure: ESOPHAGOGASTRODUODENOSCOPY (EGD) WITH PROPOFOL ;  Surgeon: Marnee Sink, MD;  Location: [facility];  Service: Endoscopy;  Laterality: N/A;   HEMORRHOID BANDING  2018   POLYPECTOMY N/A 05/10/2020    Procedure: POLYPECTOMY;  Surgeon: Marnee Sink, MD;  Location: [facility];  Service: Endoscopy;  Laterality: N/A;   REMOVAL OF SIDE PLATE, DISTAL FIBULA, RIGHT ANKLE  05-08-2004   SHOULDER ARTHROSCOPY Right 09/03/2022   Procedure: RIGHT SHOULDER ARTHROSCOPY WITH ROTATOR INTERVAL RELEASE;  Surgeon: Jasmine Mesi, MD;  Location: [facility];  Service: Orthopedics;  Laterality: Right;   SHOULDER ARTHROSCOPY WITH OPEN ROTATOR CUFF REPAIR AND DISTAL CLAVICLE ACROMINECTOMY Right 03/20/2022   Procedure: RIGHT SHOULDER ARTHROSCOPY, POSTERIOR LABRAL REPAIR, DEBRIDEMENT, ARTHROSCOPIC DISTAL CLAVICLE EXCISION,;  Surgeon: Jasmine Mesi, MD;  Location: [facility];  Service: Orthopedics;  Laterality: Right;   SHOULDER CLOSED REDUCTION Right 09/03/2022   Procedure: MANIPULATION UNDER ANESTHESIA;  Surgeon: Jasmine Mesi, MD;  Location: [facility] OR;  Service: Orthopedics;  Laterality: Right;   UPPER GASTROINTESTINAL ENDOSCOPY     Patient Active Problem List   Diagnosis Date Noted   Incontinence of feces 11/24/2022   Urinary incontinence 11/24/2022   Adhesive capsulitis of right shoulder 09/19/2022   Arthritis of right acromioclavicular joint 03/22/2022   Degenerative superior labral anterior-to-posterior (SLAP) tear of right shoulder 03/22/2022   Biceps tendonitis on right 03/22/2022   Tear of right glenoid labrum 03/22/2022   Chronic right shoulder pain 01/19/2022   History of colonic polyps    Polyp of sigmoid colon    Gastroesophageal reflux  disease without esophagitis    BRCA negative 10/13/2019   Family history of ovarian cancer 09/18/2019   Prolapsed internal hemorrhoids, grade 2 05/19/2016   PCP: Kit Peri A -PA   REFERRING PROVIDER: Zuleta,   REFERRING DIAG:  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  ONSET DATE:   SUBJECTIVE:                SUBJECTIVE STATEMENT TODAY:                                                           Pt was sick for the past two weeks ( diarrhea and fever and congestion)  and she finally is feeling better this week. Pt felt fatigued. Pt had to prop up with 2 pillow the correct way to breathe.   LBP and midback have resolved and able to sit for > 30 min at computer and at church. Pt also notices her L TMJ issues are getting better too.    SUBJECTIVE STATEMENT ON EVAL   06/22/23:  1_ fecal smearing:  occurs 3 x week mostly in the evening after sitting  , changes pads 2-3 x day . Pt has noticed a decreased in fecal leakage after changing to a [place] Diet and removed caffeine for the past 4 months. Pt also acupuncture visits for TMJ and LBP for incontinence issues for 16 visits from Feb - April with Dr. Ruther Cower ( chiropractor).  Prior to these Tx and dietary changes , pt was having fecal leakage during the night and during the day. And going thru 3-5 pads. Daily fluid : 96 fl oz of water  and no alcohol/ coffee. Juice only 2 x week . Denied straining with bowel movements, Uses a bidet.  Daily BMS , once a day mostly with Pt has stool consistency Type 4-5 .   Pt gets rectal spasms at anal sphincter after sitting for long periods like recent car trip. Pt used get these rectal spasms in the past 6 month before she changed her diet   2_LBP non radiating pain:    Sitting for > 1 hr at computer  and church causes LBP 5-6/10 . Pt had this LBP for past 15-20 years.   3_ thoracic pain without radiating pain : Standing  at sink and counter with food prep and cooking 10-15 min   cause thoracic pain 6-7/10 . Pt had this thoracic pain for last 10 years   4.  Urinary leakage occurs  sneezing and coughing.    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 See above   PRECAUTIONS: None  WEIGHT BEARING RESTRICTIONS: No  FALLS:  Has patient fallen in last 6 months? 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, feeding animals with hay, mending fences     OBJECTIVE:     [facility] PT Assessment - 07/27/23 0947       PROM   Overall PROM Comments FADDIR L SIJ restricted > R      Strength   Overall Strength Comments L hip abd 3/5,L 4+/5,      Palpation   SI assessment  levelled shoulder and pelvis    Palpation comment deviated T3,7 to L, tightness at masseter L occiput L,             [facility] Adult PT Treatment/Exercise - 07/27/23 1651       Modalities   Modalities Moist Heat      Moist Heat Therapy   Number Minutes Moist Heat 5 Minutes    Moist Heat Location --   C/T junction ( unbilled)     Manual Therapy   Manual therapy comments STM/MWM at problem areas noted in assessment to improve C/T mobility, scapular mobility                HOME EXERCISE PROGRAM: See pt instruction section    ASSESSMENT:  CLINICAL IMPRESSION:   Improvements across the past sessions:   Pt shows corrected alignment of pelvis and spine with levelled shoulders and iliac crest.  LBP and midback have resolved and able to sit for > 30 min at computer and at church. Pt also notices her L TMJ issues are getting better too.               Continued to apply manual Tx to L cervical/scapular area to  realign spine. Pt demo'd improved mobility in these areas post Tx which will help pt progress to deep core training with less compensatory techniques/ overuse of upper trap.            Plan to add deep core training to address fecal smearing and urinary incontinence and also to add cervico-scapular strengthening at upcoming sessions to help pt restore function of thoracic and low back area with washing dishes and sitting at church.     Anticipate these improvements today will help promote upright posture and optimize IAP system for improved pelvic floor function, trunk stability, gait, balance, stabilization with mobility tasks.   Regional interdependent approaches will yield greater benefits .     Pt benefits from skilled PT.     OBJECTIVE IMPAIRMENTS decreased activity tolerance, decreased coordination, decreased endurance, decreased mobility, difficulty walking, decreased ROM, decreased strength, decreased safety awareness, hypomobility, increased muscle spasms, impaired flexibility, improper body mechanics, postural dysfunction, and pain. scar restrictions   ACTIVITY LIMITATIONS  self-care,  home chores, work tasks    PARTICIPATION LIMITATIONS:  community, farming activities and Recruitment consultant activities    PERSONAL FACTORS   see pertinent Hx with surgeries above /  post retiurement plans to take for farm with strenuous physical activities are also affecting patient's functional outcome.    REHAB POTENTIAL: Good   CLINICAL DECISION MAKING: Evolving/moderate complexity   EVALUATION COMPLEXITY: Moderate    PATIENT EDUCATION:    Education details: Showed pt anatomy images. Explained muscles attachments/ connection, physiology of deep core system/ spinal- thoracic-pelvis-lower kinetic chain as they relate to pt's presentation, Sx, and past Hx. Explained what and how these areas of  deficits need to be restored to balance and function    See Therapeutic activity / neuromuscular  re-education section  Answered pt's questions.   Person educated: Patient Education method: Explanation, Demonstration, Tactile cues, Verbal cues, and Handouts Education comprehension: verbalized understanding, returned demonstration, verbal cues required, tactile cues required, and needs further education     PLAN: PT FREQUENCY: 1x/week   PT DURATION: 10 weeks   PLANNED INTERVENTIONS: Therapeutic exercises, Therapeutic activity, Neuromuscular re-education, Balance training, Gait training, Patient/Family education, Self Care, Joint mobilization, Spinal mobilization, Moist heat, Taping, and Manual therapy, dry needling.   PLAN FOR NEXT SESSION: See clinical impression for plan     GOALS: Goals reviewed with patient? Yes  SHORT TERM GOALS: Target date: 07/20/2023    Pt will demo IND with HEP                    Baseline: Not IND            Goal status: INITIAL   LONG TERM GOALS: Target date: 08/31/2023    1.Pt will demo proper deep core coordination without chest breathing and optimal excursion of diaphragm/pelvic floor in order to promote spinal stability and pelvic floor function  Baseline: dyscoordination Goal status: INITIAL  2.  Pt will demo proper body mechanics in against gravity tasks and ADLs  farm work  tasks, fitness  to minimize straining pelvic floor / back  issues  Baseline: not IND, improper form that places strain on pelvic floor  Goal status: INITIAL    3. Pt will demo increased gait speed > 1.5 m/s with reciprocal gait pattern, longer stride length  in order to ambulate safely in community and return to fitness routine  Baseline: 1.4 m/s, limited arm swings, limited posterior L thoracic rotation, excessive pelvic sway to L, abducted feet, ,  Goal status: INITIAL    4. Pt will demo levelled pelvic girdle and shoulder height  and to demo equal downward movement of scapula in order to progress to deep core strengthening HEP and restore mobility at spine,  pelvis, gait, posture minimize falls, and improve balance   Baseline: R shoulder and R iliac crest lowered , scapular downward movement : L delayed  Goal status: INITIAL   5. Pt will report being able to prep food and wash dishes for > 30 min with < 2/10 thoracic pain and to be able to sit through church / computer > 1.5 hr with < 2/10 LBP  in order to return community and social activities  Baseline:  Sitting for > 1 hr at computer  and church causes LBP 5-6/10  Standing  at sink and counter with food prep and cooking 10-15 min   cause thoracic pain 6-7/10  Goal status: INITIAL   6. Pt will improve PFDI-7 questionnaire to >10   pts  score change in any categories  to demo improved QOL  Baseline:  125 pts   ( greater pts indicate greater negative impact on QOL)                    38 for UIQ-7 ,  43 for both CRAIQ-7 and POPIQ-7  Goal Status: INITIAL        Modesto Andreas, PT 07/27/2023, 9:40 AM

## 2023-08-03 ENCOUNTER — Ambulatory Visit: Payer: BC Managed Care – PPO | Admitting: Physical Therapy

## 2023-08-03 DIAGNOSIS — M533 Sacrococcygeal disorders, not elsewhere classified: Secondary | ICD-10-CM | POA: Diagnosis not present

## 2023-08-03 DIAGNOSIS — R278 Other lack of coordination: Secondary | ICD-10-CM

## 2023-08-03 DIAGNOSIS — R2689 Other abnormalities of gait and mobility: Secondary | ICD-10-CM

## 2023-08-03 DIAGNOSIS — M5459 Other low back pain: Secondary | ICD-10-CM

## 2023-08-03 DIAGNOSIS — M546 Pain in thoracic spine: Secondary | ICD-10-CM

## 2023-08-03 NOTE — Patient Instructions (Signed)
 Place band in "U"    band under ballmounds  while laying on back w/ knees bent   Lying on back, knees bent    band under ballmounds  while laying on back w/ knees bent  "W" exercise  10 reps x 2 sets   Band is placed under feet, knees bent, feet are hip width apart Hold band with thumbs point out, keep upper arm and elbow touching the bed the whole time  - inhale and then exhale pull bands by bending elbows hands move in a "w"  (feel shoulder blades squeezing)   ________________   Oblique/ scapula stabilization   Opposite arm   Place band in "U"    band under ballmounds  while laying on back w/ knees bent     20 reps  on each side  Holding band from opposite thigh,  Inhale,    exhale then pull band across body while keeping elbow , shoulders, back of the head pressed down    ______________   Deep core level 1-2 ( handout) daily x 2

## 2023-08-03 NOTE — Therapy (Signed)
 OUTPATIENT PHYSICAL THERAPY TREATMENT     Patient Name: Nancy Freeman MRN: [id number] DOB:Oct 28, 1970, 53 y.o., female Today's Date: 08/03/2023   PT End of Session - 08/03/23 1017     Visit Number 5    Number of Visits 10    Date for PT Re-Evaluation 08/31/23    PT Start Time 0940    PT Stop Time 1018    PT Time Calculation (min) 38 min    Activity Tolerance Patient tolerated treatment well;No increased pain    Behavior During Therapy [facility] for tasks assessed/performed             Past Medical History:  Diagnosis Date   ADHD (attention deficit hyperactivity disorder)    ADHD   Allergy    Anginal pain (HCC)    Normal TTE and myocardial perfusion scan 04/24/20   Anxiety    BRCA negative 08/2019   MyRisk neg   Family history of breast cancer 08/2019   IBIS=14.3%/riskscore=14.9%   Family history of ovarian cancer    GERD (gastroesophageal reflux disease)    Headache    migraines  1-2x/month   Hidradenoma VULVAR   Hypertension    IBS (irritable bowel syndrome)    Internal hemorrhoid    Past Surgical History:  Procedure Laterality Date   ABDOMINAL HYSTERECTOMY  01-19-2001  DR Ola Berger   W/ LEFT SALPINGO-OOPHORECTOMY AND EXCISION ENDOMETRIOSIS   BICEPT TENODESIS Right 03/20/2022   Procedure: OPEN BICEPS TENODESIS;  Surgeon: Jasmine Mesi, MD;  Location: [facility];  Service: Orthopedics;  Laterality: Right;   BREAST BIOPSY Left 09/22/2017   affirm bx, pash   CESAREAN SECTION     COLONOSCOPY     COLONOSCOPY WITH PROPOFOL  N/A 05/10/2020   Procedure: COLONOSCOPY WITH PROPOFOL ;  Surgeon: Marnee Sink, MD;  Location: [facility];  Service: Endoscopy;  Laterality: N/A;   ESOPHAGOGASTRODUODENOSCOPY (EGD) WITH PROPOFOL  N/A 05/10/2020   Procedure: ESOPHAGOGASTRODUODENOSCOPY (EGD) WITH PROPOFOL ;  Surgeon: Marnee Sink, MD;  Location: [facility];  Service: Endoscopy;  Laterality: N/A;   HEMORRHOID BANDING  2018   POLYPECTOMY N/A 05/10/2020    Procedure: POLYPECTOMY;  Surgeon: Marnee Sink, MD;  Location: [facility];  Service: Endoscopy;  Laterality: N/A;   REMOVAL OF SIDE PLATE, DISTAL FIBULA, RIGHT ANKLE  05-08-2004   SHOULDER ARTHROSCOPY Right 09/03/2022   Procedure: RIGHT SHOULDER ARTHROSCOPY WITH ROTATOR INTERVAL RELEASE;  Surgeon: Jasmine Mesi, MD;  Location: [facility];  Service: Orthopedics;  Laterality: Right;   SHOULDER ARTHROSCOPY WITH OPEN ROTATOR CUFF REPAIR AND DISTAL CLAVICLE ACROMINECTOMY Right 03/20/2022   Procedure: RIGHT SHOULDER ARTHROSCOPY, POSTERIOR LABRAL REPAIR, DEBRIDEMENT, ARTHROSCOPIC DISTAL CLAVICLE EXCISION,;  Surgeon: Jasmine Mesi, MD;  Location: [facility];  Service: Orthopedics;  Laterality: Right;   SHOULDER CLOSED REDUCTION Right 09/03/2022   Procedure: MANIPULATION UNDER ANESTHESIA;  Surgeon: Jasmine Mesi, MD;  Location: [facility];  Service: Orthopedics;  Laterality: Right;   UPPER GASTROINTESTINAL ENDOSCOPY     Patient Active Problem List   Diagnosis Date Noted   Incontinence of feces 11/24/2022   Urinary incontinence 11/24/2022   Adhesive capsulitis of right shoulder 09/19/2022   Arthritis of right acromioclavicular joint 03/22/2022   Degenerative superior labral anterior-to-posterior (SLAP) tear of right shoulder 03/22/2022   Biceps tendonitis on right 03/22/2022   Tear of right glenoid labrum 03/22/2022   Chronic right shoulder pain 01/19/2022   History of colonic polyps    Polyp of sigmoid colon    Gastroesophageal reflux  disease without esophagitis    BRCA negative 10/13/2019   Family history of ovarian cancer 09/18/2019   Prolapsed internal hemorrhoids, grade 2 05/19/2016   PCP: Jefferey Minerva -[place]   REFERRING PROVIDER: Zuleta,   REFERRING DIAG:  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  R15.1 (ICD-10-CM) - Fecal smearing  N39.41 (ICD-10-CM) - Urge incontinence of urine  R35.0 (ICD-10-CM) - Urinary frequency  N32.81 (ICD-10-CM) - OAB (overactive  bladder)  M62.89 (ICD-10-CM) - Pelvic floor dysfunction  M62.838 (ICD-10-CM) - Levator spasm    Rationale for Evaluation and Treatment Rehabilitation  THERAPY DIAG:  Sacrococcygeal disorders, not elsewhere classified  Pain in thoracic spine  Other low back pain  Other lack of coordination  Other abnormalities of gait and mobility  ONSET DATE:   SUBJECTIVE:                SUBJECTIVE STATEMENT TODAY:                                                           Pt has been doing her exercises.   SUBJECTIVE STATEMENT ON EVAL   06/22/23:  1_ fecal smearing:  occurs 3 x week mostly in the evening after sitting  , changes pads 2-3 x day . Pt has noticed a decreased in fecal leakage after changing to a Mediterre[place] Diet and removed caffeine for the past 4 months. Pt also acupuncture visits for TMJ and LBP for incontinence issues for 16 visits from Feb - April with Dr. Ruther Cower ( chiropractor).  Prior to these Tx and dietary changes , pt was having fecal leakage during the night and during the day. And going thru 3-5 pads. Daily fluid : 96 fl oz of water  and no alcohol/ coffee. Juice only 2 x week . Denied straining with bowel movements, Uses a bidet.  Daily BMS , once a day mostly with Pt has stool consistency Type 4-5 .   Pt gets rectal spasms at anal sphincter after sitting for long periods like recent car trip. Pt used get these rectal spasms in the past 6 month before she changed her diet   2_LBP non radiating pain:    Sitting for > 1 hr at computer  and church causes LBP 5-6/10 . Pt had this LBP for past 15-20 years.   3_ thoracic pain without radiating pain : Standing  at sink and counter with food prep and cooking 10-15 min   cause thoracic pain 6-7/10 . Pt had this thoracic pain for last 10 years   4.  Urinary leakage occurs sneezing and coughing.    PERTINENT HISTORY:  1 C section delivery with only child, abdominal hysterectomy  2/2 endometriosis 2002 , laparoscopic surgeries for  endometriosis, Dx with endometriosis 2000 Tavlov cysts  ( S1-2)    PAIN:  Are you having pain? See above   PRECAUTIONS: None  WEIGHT BEARING RESTRICTIONS: No  FALLS:  Has patient fallen in last 6 months? 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, feeding animals  with hay, mending fences     OBJECTIVE:    [facility] PT Assessment - 08/03/23 1052       Coordination   Coordination and Movement Description poor propioception of pelvic tilt, excessive use of ab with deep core breathing      Palpation   SI assessment  levelled shoulder and pelvis               [facility] Adult PT Treatment/Exercise - 08/03/23 1021       Neuro Re-ed    Neuro Re-ed Details  provided rolled towel under L/S junction to minimize tightness complaints at low back and to promote propioception of anterior tilt of pelvis, cued for new strengthening HEP for cervico-scapular strenghtening, cued for deep core level 1-2      Exercises   Exercises Other Exercises    Other Exercises  see pt instructions                HOME EXERCISE PROGRAM: See pt instruction section    ASSESSMENT:  CLINICAL IMPRESSION:   Improvements across the past sessions:   Pt shows corrected alignment of pelvis and spine with levelled shoulders and iliac crest.  LBP and midback have resolved and able to sit for > 30 min at computer and at church.              Initiated Scientist, research (physical sciences) with band today in hooklying and added deep core training which required cues for less ab overuse. Pt demo'd  correctly post Tx.             Anticipate these improvements will help with fecal smearing and urinary incontinence and  function  of thoracic and low back area with washing dishes and sitting at church.     Anticipate these improvements today will help promote upright posture and optimize IAP system for improved pelvic floor function, trunk stability, gait, balance, stabilization with mobility tasks.   Regional interdependent approaches will yield greater benefits .     Pt benefits from skilled PT.     OBJECTIVE IMPAIRMENTS decreased activity tolerance, decreased coordination, decreased endurance, decreased mobility, difficulty walking, decreased ROM, decreased strength, decreased safety awareness, hypomobility, increased muscle spasms, impaired flexibility, improper body mechanics, postural dysfunction, and pain. scar restrictions   ACTIVITY LIMITATIONS  self-care,  home chores, work tasks    PARTICIPATION LIMITATIONS:  community, farming activities and Recruitment consultant activities    PERSONAL FACTORS   see pertinent Hx with surgeries above /  post retiurement plans to take for farm with strenuous physical activities are also affecting patient's functional outcome.    REHAB POTENTIAL: Good   CLINICAL DECISION MAKING: Evolving/moderate complexity   EVALUATION COMPLEXITY: Moderate    PATIENT EDUCATION:    Education details: Showed pt anatomy images. Explained muscles attachments/ connection, physiology of deep core system/ spinal- thoracic-pelvis-lower kinetic chain as they relate to pt's presentation, Sx, and past Hx. Explained what and how these areas of deficits need to be restored to balance and function    See Therapeutic activity / neuromuscular re-education section  Answered pt's questions.   Person educated: Patient Education method: Explanation, Demonstration, Tactile cues, Verbal cues, and Handouts Education comprehension: verbalized understanding, returned demonstration, verbal cues required, tactile cues required, and needs further education     PLAN: PT FREQUENCY: 1x/week   PT DURATION: 10  weeks   PLANNED INTERVENTIONS: Therapeutic exercises, Therapeutic activity, Neuromuscular re-education, Balance training, Gait training, Patient/Family education, Self Care, Joint mobilization, Spinal mobilization, Moist heat,  Taping, and Manual therapy, dry needling.   PLAN FOR NEXT SESSION: See clinical impression for plan     GOALS: Goals reviewed with patient? Yes  SHORT TERM GOALS: Target date: 07/20/2023    Pt will demo IND with HEP                    Baseline: Not IND            Goal status: INITIAL   LONG TERM GOALS: Target date: 08/31/2023    1.Pt will demo proper deep core coordination without chest breathing and optimal excursion of diaphragm/pelvic floor in order to promote spinal stability and pelvic floor function  Baseline: dyscoordination Goal status: INITIAL  2.  Pt will demo proper body mechanics in against gravity tasks and ADLs  farm work  tasks, fitness  to minimize straining pelvic floor / back  issues  Baseline: not IND, improper form that places strain on pelvic floor  Goal status: INITIAL    3. Pt will demo increased gait speed > 1.5 m/s with reciprocal gait pattern, longer stride length  in order to ambulate safely in community and return to fitness routine  Baseline: 1.4 m/s, limited arm swings, limited posterior L thoracic rotation, excessive pelvic sway to L, abducted feet, ,  Goal status: INITIAL    4. Pt will demo levelled pelvic girdle and shoulder height  and to demo equal downward movement of scapula in order to progress to deep core strengthening HEP and restore mobility at spine, pelvis, gait, posture minimize falls, and improve balance   Baseline: R shoulder and R iliac crest lowered , scapular downward movement : L delayed  Goal status: INITIAL   5. Pt will report being able to prep food and wash dishes for > 30 min with < 2/10 thoracic pain and to be able to sit through church / computer > 1.5 hr with < 2/10 LBP  in order to return  community and social activities  Baseline:  Sitting for > 1 hr at computer  and church causes LBP 5-6/10  Standing  at sink and counter with food prep and cooking 10-15 min   cause thoracic pain 6-7/10  Goal status: INITIAL   6. Pt will improve PFDI-7 questionnaire to >10   pts  score change in any categories  to demo improved QOL  Baseline:  125 pts   ( greater pts indicate greater negative impact on QOL)                    38 for UIQ-7 ,  43 for both CRAIQ-7 and POPIQ-7  Goal Status: INITIAL        Modesto Andreas, PT 08/03/2023, 10:54 AM

## 2023-08-10 ENCOUNTER — Ambulatory Visit: Payer: BC Managed Care – PPO | Admitting: Physical Therapy

## 2023-08-10 DIAGNOSIS — M533 Sacrococcygeal disorders, not elsewhere classified: Secondary | ICD-10-CM

## 2023-08-10 DIAGNOSIS — M5459 Other low back pain: Secondary | ICD-10-CM

## 2023-08-10 DIAGNOSIS — R278 Other lack of coordination: Secondary | ICD-10-CM

## 2023-08-10 DIAGNOSIS — R2689 Other abnormalities of gait and mobility: Secondary | ICD-10-CM

## 2023-08-10 DIAGNOSIS — M546 Pain in thoracic spine: Secondary | ICD-10-CM

## 2023-08-10 NOTE — Patient Instructions (Addendum)
  Proper body mechanics with getting out of a chair to decrease strain  on back &pelvic floor   Avoid holding your breath when Getting out of the chair:  Scoot to front part of chair chair Heels behind knees, TOE UP AND SPREAD to lift arches,  feet are hip width apart, nose over toes  Inhale like you are smelling roses Exhale to stand   ____    Feet slides :   Points of contact at sitting bones  Four points of contact of foot,  Side knee back while keeping knee out along 2-3rd toe line   Heel up, ankle not twist out Lower heel while keeping knee out along 2-3rd toe line Four points of contact of foot, Slide foot back while keeping knee out along 2-3rd toe line   Repeated with other foot    on plastic bags/ sheet to slide   __

## 2023-08-10 NOTE — Therapy (Signed)
 OUTPATIENT PHYSICAL THERAPY TREATMENT     Patient Name: Nancy Freeman MRN: [id number] DOB:14-Jan-1971, 53 y.o., female Today's Date: 08/10/2023   PT End of Session - 08/10/23 1023     Visit Number 6    Number of Visits 10    Date for PT Re-Evaluation 08/31/23    PT Start Time 1020    PT Stop Time 1100    PT Time Calculation (min) 40 min    Activity Tolerance Patient tolerated treatment well;No increased pain    Behavior During Therapy [facility] for tasks assessed/performed          Past Medical History:  Diagnosis Date   ADHD (attention deficit hyperactivity disorder)    ADHD   Allergy    Anginal pain (HCC)    Normal TTE and myocardial perfusion scan 04/24/20   Anxiety    BRCA negative 08/2019   MyRisk neg   Family history of breast cancer 08/2019   IBIS=14.3%/riskscore=14.9%   Family history of ovarian cancer    GERD (gastroesophageal reflux disease)    Headache    migraines  1-2x/month   Hidradenoma VULVAR   Hypertension    IBS (irritable bowel syndrome)    Internal hemorrhoid    Past Surgical History:  Procedure Laterality Date   ABDOMINAL HYSTERECTOMY  01-19-2001  DR Ola Berger   W/ LEFT SALPINGO-OOPHORECTOMY AND EXCISION ENDOMETRIOSIS   BICEPT TENODESIS Right 03/20/2022   Procedure: OPEN BICEPS TENODESIS;  Surgeon: Jasmine Mesi, MD;  Location: [facility];  Service: Orthopedics;  Laterality: Right;   BREAST BIOPSY Left 09/22/2017   affirm bx, pash   CESAREAN SECTION     COLONOSCOPY     COLONOSCOPY WITH PROPOFOL  N/A 05/10/2020   Procedure: COLONOSCOPY WITH PROPOFOL ;  Surgeon: Marnee Sink, MD;  Location: [facility];  Service: Endoscopy;  Laterality: N/A;   ESOPHAGOGASTRODUODENOSCOPY (EGD) WITH PROPOFOL  N/A 05/10/2020   Procedure: ESOPHAGOGASTRODUODENOSCOPY (EGD) WITH PROPOFOL ;  Surgeon: Marnee Sink, MD;  Location: [facility];  Service: Endoscopy;  Laterality: N/A;   HEMORRHOID BANDING  2018   POLYPECTOMY N/A 05/10/2020   Procedure:  POLYPECTOMY;  Surgeon: Marnee Sink, MD;  Location: [facility];  Service: Endoscopy;  Laterality: N/A;   REMOVAL OF SIDE PLATE, DISTAL FIBULA, RIGHT ANKLE  05-08-2004   SHOULDER ARTHROSCOPY Right 09/03/2022   Procedure: RIGHT SHOULDER ARTHROSCOPY WITH ROTATOR INTERVAL RELEASE;  Surgeon: Jasmine Mesi, MD;  Location: [facility];  Service: Orthopedics;  Laterality: Right;   SHOULDER ARTHROSCOPY WITH OPEN ROTATOR CUFF REPAIR AND DISTAL CLAVICLE ACROMINECTOMY Right 03/20/2022   Procedure: RIGHT SHOULDER ARTHROSCOPY, POSTERIOR LABRAL REPAIR, DEBRIDEMENT, ARTHROSCOPIC DISTAL CLAVICLE EXCISION,;  Surgeon: Jasmine Mesi, MD;  Location: [facility];  Service: Orthopedics;  Laterality: Right;   SHOULDER CLOSED REDUCTION Right 09/03/2022   Procedure: MANIPULATION UNDER ANESTHESIA;  Surgeon: Jasmine Mesi, MD;  Location: [facility];  Service: Orthopedics;  Laterality: Right;   UPPER GASTROINTESTINAL ENDOSCOPY     Patient Active Problem List   Diagnosis Date Noted   Incontinence of feces 11/24/2022   Urinary incontinence 11/24/2022   Adhesive capsulitis of right shoulder 09/19/2022   Arthritis of right acromioclavicular joint 03/22/2022   Degenerative superior labral anterior-to-posterior (SLAP) tear of right shoulder 03/22/2022   Biceps tendonitis on right 03/22/2022   Tear of right glenoid labrum 03/22/2022   Chronic right shoulder pain 01/19/2022   History of colonic polyps    Polyp of sigmoid colon    Gastroesophageal reflux disease without esophagitis  BRCA negative 10/13/2019   Family history of ovarian cancer 09/18/2019   Prolapsed internal hemorrhoids, grade 2 05/19/2016   PCP: Jefferey Minerva -[place]   REFERRING PROVIDER: [place],   REFERRING DIAG:  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  R15.1 (ICD-10-CM) - Fecal smearing  N39.41 (ICD-10-CM) - Urge incontinence of urine  R35.0 (ICD-10-CM) - Urinary frequency  N32.81 (ICD-10-CM) - OAB (overactive bladder)   M62.89 (ICD-10-CM) - Pelvic floor dysfunction  M62.838 (ICD-10-CM) - Levator spasm    Rationale for Evaluation and Treatment Rehabilitation  THERAPY DIAG:  Sacrococcygeal disorders, not elsewhere classified  Pain in thoracic spine  Other low back pain  Other lack of coordination  Other abnormalities of gait and mobility  ONSET DATE:   SUBJECTIVE:                SUBJECTIVE STATEMENT TODAY:                                                           Pt is able to cook and prep food without thoracic pain for 1.5 hours compared to pain occurring after 10-15 min .  Pt is able to sit at church and while driving without LBP the past 5 weeks.   Pt noticed achiness at inner bottom R thigh when she is still. There is no activity that causes great discomfort.   SUBJECTIVE STATEMENT ON EVAL   06/22/23:  1_ fecal smearing:  occurs 3 x week mostly in the evening after sitting  , changes pads 2-3 x day . Pt has noticed a decreased in fecal leakage after changing to a [place]iter[place] Diet and removed caffeine for the past 4 months. Pt also acupuncture visits for TMJ and LBP for incontinence issues for 16 visits from Feb - April with Dr. Ruther Cower ( chiropractor).  Prior to these Tx and dietary changes , pt was having fecal leakage during the night and during the day. And going thru 3-5 pads. Daily fluid : 96 fl oz of water  and no alcohol/ coffee. Juice only 2 x week . Denied straining with bowel movements, Uses a bidet.  Daily BMS , once a day mostly with Pt has stool consistency Type 4-5 .   Pt gets rectal spasms at anal sphincter after sitting for long periods like recent car trip. Pt used get these rectal spasms in the past 6 month before she changed her diet   2_LBP non radiating pain:    Sitting for > 1 hr at computer  and church causes LBP 5-6/10 . Pt had this LBP for past 15-20 years.   3_ thoracic pain without radiating pain : Standing  at sink and counter with food prep and cooking 10-15 min    cause thoracic pain 6-7/10 . Pt had this thoracic pain for last 10 years   4.  Urinary leakage occurs sneezing and coughing.    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 See above   PRECAUTIONS: None  WEIGHT BEARING RESTRICTIONS: No  FALLS:  Has patient fallen in last 6 months? 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, feeding animals with hay, mending fences     OBJECTIVE:     [facility] PT Assessment - 08/10/23 1028       Observation/Other Assessments   Observations adducted knees supination/inversion      Palpation   SI assessment  levelled shoulder and pelvis    Palpation comment tenderness at ischial tuberosity attachments on R,  FADDIR limited on R,   B midfoot / tib-fib tightness,    STRENGTH          DF/EV R     3/5 , L 4/5       [facility] Adult PT Treatment/Exercise - 08/10/23 1104       Therapeutic Activites    Other Therapeutic Activities explained gait cycle to promote ER of knee / DF/EV /lifted arches to address the achiness at ischial tuberosity on R and optimize pelvic floor function      Neuro Re-ed    Neuro Re-ed Details  excessive cues for transverse arch coactivation with COM, less heel striking, higher knees, wider knees to minimize LOB, heel striking, short strides, tightness of planta fascia, and improve DF/EV for more deep core co-activation during gait   Cued for sit to stand with lifted arches to minimize adducted knees/ overactive pelvic floor, dropped arches,   Cued for  proper technique and propioception of knee ER/ toe abduction/ ext to lift arches that mimic cycles of gait      Manual Therapy   Manual therapy comments R foot: distraction, AP/PA mob grade III, STM/MWM at midfoot joints and tib/fib to promote knee ER, DF/EV, toe abduction , minimize adducted knees supination/inversion, plan to address L foot next session                  HOME EXERCISE PROGRAM: See pt instruction section    ASSESSMENT:  CLINICAL IMPRESSION:   Improvements across the past sessions:   Pt shows corrected alignment of pelvis and spine with levelled shoulders and iliac crest.  LBP and midback have resolved and able to sit for > 30 min at computer and at church.  Pt is able to cook and prep food without thoracic pain for 1.5 hours compared to pain occurring after 10-15 min .  Pt is able to sit at church and while driving without LBP the past 5 weeks.                          Addressed pt's complaint of R achiness at ischial tuberosity after starting last week's HEP.  Pt's R SIJ, tib/fib, midfoot were hypomobile with adducted knees/ limited DF/EV and weakness of DF/EV which were addressed with manual Tx.      Provided neuromuscular re-education : Excessive cues for transverse arch coactivation with COM, less heel striking, higher knees, wider knees to minimize LOB, heel striking, short strides, tightness of planta fascia, and improve DF/EV for more deep core co-activation during gait   Cued for sit to stand with lifted arches to minimize adducted knees/ overactive pelvic floor, dropped arches,   Cued for proper technique and propioception of knee ER/ toe abduction/ ext to lift arches that mimic cycles of gait.  Anticipate these improvements will help minimize pelvic floor overactivity and  further improve gait and fecal smearing and urinary incontinence through the use of regional interdependence approach.     Regional interdependent approaches  will yield greater benefits .     Pt benefits from skilled PT.     OBJECTIVE IMPAIRMENTS decreased activity tolerance, decreased coordination, decreased endurance, decreased mobility, difficulty walking, decreased ROM, decreased strength, decreased safety awareness, hypomobility, increased muscle spasms, impaired flexibility, improper body mechanics, postural dysfunction, and pain. scar restrictions   ACTIVITY LIMITATIONS  self-care,  home chores, work tasks    PARTICIPATION LIMITATIONS:  community, farming activities and Recruitment consultant activities    PERSONAL FACTORS   see pertinent Hx with surgeries above /  post retiurement plans to take for farm with strenuous physical activities are also affecting patient's functional outcome.    REHAB POTENTIAL: Good   CLINICAL DECISION MAKING: Evolving/moderate complexity   EVALUATION COMPLEXITY: Moderate    PATIENT EDUCATION:    Education details: Showed pt anatomy images. Explained muscles attachments/ connection, physiology of deep core system/ spinal- thoracic-pelvis-lower kinetic chain as they relate to pt's presentation, Sx, and past Hx. Explained what and how these areas of deficits need to be restored to balance and function    See Therapeutic activity / neuromuscular re-education section  Answered pt's questions.   Person educated: Patient Education method: Explanation, Demonstration, Tactile cues, Verbal cues, and Handouts Education comprehension: verbalized understanding, returned demonstration, verbal cues required, tactile cues required, and needs further education     PLAN: PT FREQUENCY: 1x/week   PT DURATION: 10 weeks   PLANNED INTERVENTIONS: Therapeutic exercises, Therapeutic activity, Neuromuscular re-education, Balance training, Gait training, Patient/Family education, Self Care, Joint mobilization, Spinal mobilization, Moist heat, Taping, and Manual therapy, dry needling.   PLAN FOR NEXT SESSION: See clinical  impression for plan     GOALS: Goals reviewed with patient? Yes  SHORT TERM GOALS: Target date: 07/20/2023    Pt will demo IND with HEP                    Baseline: Not IND            Goal status: INITIAL   LONG TERM GOALS: Target date: 08/31/2023    1.Pt will demo proper deep core coordination without chest breathing and optimal excursion of diaphragm/pelvic floor in order to promote spinal stability and pelvic floor function  Baseline: dyscoordination Goal status: INITIAL  2.  Pt will demo proper body mechanics in against gravity tasks and ADLs  farm work  tasks, fitness  to minimize straining pelvic floor / back  issues  Baseline: not IND, improper form that places strain on pelvic floor  Goal status: INITIAL    3. Pt will demo increased gait speed > 1.5 m/s with reciprocal gait pattern, longer stride length  in order to ambulate safely in community and return to fitness routine  Baseline: 1.4 m/s, limited arm swings, limited posterior L thoracic rotation, excessive pelvic sway to L, abducted feet, ,  Goal status: INITIAL    4. Pt will demo levelled pelvic girdle and shoulder height  and to demo equal downward movement of scapula in order to progress to deep core strengthening HEP and restore mobility at spine, pelvis, gait, posture minimize falls, and improve balance   Baseline: R shoulder and R iliac crest lowered , scapular downward movement : L delayed  Goal status: INITIAL   5. Pt will report being able to prep  food and wash dishes for > 30 min with < 2/10 thoracic pain and to be able to sit through church / computer > 1.5 hr with < 2/10 LBP  in order to return community and social activities  Baseline:  Sitting for > 1 hr at computer  and church causes LBP 5-6/10  Standing  at sink and counter with food prep and cooking 10-15 min   cause thoracic pain 6-7/10  Goal status: INITIAL   6. Pt will improve PFDI-7 questionnaire to >10   pts  score change in any  categories  to demo improved QOL  Baseline:  125 pts   ( greater pts indicate greater negative impact on QOL)                    38 for UIQ-7 ,  43 for both CRAIQ-7 and POPIQ-7  Goal Status: INITIAL        Modesto Andreas, PT 08/10/2023, 10:24 AM

## 2023-08-17 ENCOUNTER — Ambulatory Visit: Payer: BC Managed Care – PPO | Admitting: Physical Therapy

## 2023-08-24 ENCOUNTER — Ambulatory Visit: Payer: BC Managed Care – PPO | Attending: Obstetrics and Gynecology | Admitting: Physical Therapy

## 2023-08-24 DIAGNOSIS — M5459 Other low back pain: Secondary | ICD-10-CM | POA: Insufficient documentation

## 2023-08-24 DIAGNOSIS — R2689 Other abnormalities of gait and mobility: Secondary | ICD-10-CM | POA: Diagnosis present

## 2023-08-24 DIAGNOSIS — R278 Other lack of coordination: Secondary | ICD-10-CM | POA: Diagnosis present

## 2023-08-24 DIAGNOSIS — M533 Sacrococcygeal disorders, not elsewhere classified: Secondary | ICD-10-CM | POA: Insufficient documentation

## 2023-08-24 DIAGNOSIS — M546 Pain in thoracic spine: Secondary | ICD-10-CM | POA: Diagnosis present

## 2023-08-24 NOTE — Patient Instructions (Signed)
 Functional positions Ski track stand , 45 deg turn to  at the counter  To engage legs, pelvis  Front knee above ankle   __  Reaching above with canned jars:  Ski track stance, push off the back leg ,    __  Minisquat for hay and mower leaning activities   Scoot buttocks back slight, hinge like you are looking at your reflection on a pond  Knees behind toes,  Inhale to smell flowers  Exhale on the rise like rocket  Do not lock knees, have more weight across ballmounds of feet, toes relaxed and spread them, not grip them   ___      Transition from standing to floor :  ** use cut piece of yoga mat on the ground for hands, shoulder width apart ,   stand to floor transfer :      _ slow     _ mini squat      _ crawl down with one hand on thigh      _downward dog  - >  shoulders down and back-  walk the dog ( knee bents to lengthe hamstrings)      Floor to stand :   downward dog   Feet are wider than hips, crawl hands back, butt is back, knees behind toes -> squat  Hands at waist , elbows back, chest lifts    Transition from standing to floor if you have hypertension or can not have your head lower than your heart  Wide squat in front of a CHAIR  Forearms onto the chair Lower knees down into double kneeling   floor   To get up Walk on your knees to the front of chair again Forearms onto the chair Inhale, exhale, pushing onto the forearms to life  lifts hips up, kneeping knees bent hands on thighs, then hips then pause HERE  to avoid (moving too quickly up/ blood rush) -->  knees glide forward and roll  Hips up instead of hinging spine up   * KEEP YOUR HEAD AND HEART LEVELLED , NEVER LETTING YOUR HEAD GET BELOW YOUR HEART     ____     half kneeling transitions to and from the floor    Start with ski track stance, step back with heel/ toes turned straight, front knee above ankle without moving as your back knee bends and straightens  Keep pelvis and  trunk centered between legs without shifting more to the front legs

## 2023-08-24 NOTE — Therapy (Signed)
 OUTPATIENT PHYSICAL THERAPY TREATMENT     Patient Name: Nancy Freeman MRN: [id number] DOB:[birth year], 53 y.o., female Today's Date: 08/24/2023   PT End of Session - 08/24/23 0944     Visit Number 7    Number of Visits 10    Date for PT Re-Evaluation 08/31/23    PT Start Time 0940    PT Stop Time 1020    PT Time Calculation (min) 40 min    Activity Tolerance Patient tolerated treatment well;No increased pain    Behavior During Therapy [facility] for tasks assessed/performed          Past Medical History:  Diagnosis Date   ADHD (attention deficit hyperactivity disorder)    ADHD   Allergy    Anginal pain (HCC)    Normal TTE and myocardial perfusion scan 04/24/20   Anxiety    BRCA negative 08/2019   MyRisk neg   Family history of breast cancer 08/2019   IBIS=14.3%/riskscore=14.9%   Family history of ovarian cancer    GERD (gastroesophageal reflux disease)    Headache    migraines  1-2x/month   Hidradenoma VULVAR   Hypertension    IBS (irritable bowel syndrome)    Internal hemorrhoid    Past Surgical History:  Procedure Laterality Date   ABDOMINAL HYSTERECTOMY  01-19-2001  DR CARY   W/ LEFT SALPINGO-OOPHORECTOMY AND EXCISION ENDOMETRIOSIS   BICEPT TENODESIS Right 03/20/2022   Procedure: OPEN BICEPS TENODESIS;  Surgeon: Addie Cordella Hamilton, MD;  Location: [facility];  Service: Orthopedics;  Laterality: Right;   BREAST BIOPSY Left 09/22/2017   affirm bx, pash   CESAREAN SECTION     COLONOSCOPY     COLONOSCOPY WITH PROPOFOL  N/A 05/10/2020   Procedure: COLONOSCOPY WITH PROPOFOL ;  Surgeon: Jinny Carmine, MD;  Location: [facility];  Service: Endoscopy;  Laterality: N/A;   ESOPHAGOGASTRODUODENOSCOPY (EGD) WITH PROPOFOL  N/A 05/10/2020   Procedure: ESOPHAGOGASTRODUODENOSCOPY (EGD) WITH PROPOFOL ;  Surgeon: Jinny Carmine, MD;  Location: [facility];  Service: Endoscopy;  Laterality: N/A;   HEMORRHOID BANDING  2018   POLYPECTOMY N/A 05/10/2020   Procedure:  POLYPECTOMY;  Surgeon: Jinny Carmine, MD;  Location: [facility];  Service: Endoscopy;  Laterality: N/A;   REMOVAL OF SIDE PLATE, DISTAL FIBULA, RIGHT ANKLE  05-08-2004   SHOULDER ARTHROSCOPY Right 09/03/2022   Procedure: RIGHT SHOULDER ARTHROSCOPY WITH ROTATOR INTERVAL RELEASE;  Surgeon: Addie Cordella Hamilton, MD;  Location: [facility] OR;  Service: Orthopedics;  Laterality: Right;   SHOULDER ARTHROSCOPY WITH OPEN ROTATOR CUFF REPAIR AND DISTAL CLAVICLE ACROMINECTOMY Right 03/20/2022   Procedure: RIGHT SHOULDER ARTHROSCOPY, POSTERIOR LABRAL REPAIR, DEBRIDEMENT, ARTHROSCOPIC DISTAL CLAVICLE EXCISION,;  Surgeon: Addie Cordella Hamilton, MD;  Location: [facility];  Service: Orthopedics;  Laterality: Right;   SHOULDER CLOSED REDUCTION Right 09/03/2022   Procedure: MANIPULATION UNDER ANESTHESIA;  Surgeon: Addie Cordella Hamilton, MD;  Location: [facility] OR;  Service: Orthopedics;  Laterality: Right;   UPPER GASTROINTESTINAL ENDOSCOPY     Patient Active Problem List   Diagnosis Date Noted   Incontinence of feces 11/24/2022   Urinary incontinence 11/24/2022   Adhesive capsulitis of right shoulder 09/19/2022   Arthritis of right acromioclavicular joint 03/22/2022   Degenerative superior labral anterior-to-posterior (SLAP) tear of right shoulder 03/22/2022   Biceps tendonitis on right 03/22/2022   Tear of right glenoid labrum 03/22/2022   Chronic right shoulder pain 01/19/2022   History of colonic polyps    Polyp of sigmoid colon    Gastroesophageal reflux disease without esophagitis  BRCA negative 10/13/2019   Family history of ovarian cancer 09/18/2019   Prolapsed internal hemorrhoids, grade 2 05/19/2016   PCP: [facility]   REFERRING PROVIDER: Zuleta,   REFERRING DIAG:  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  R15.1 (ICD-10-CM) - Fecal smearing  N39.41 (ICD-10-CM) - Urge incontinence of urine  R35.0 (ICD-10-CM) - Urinary frequency  N32.81 (ICD-10-CM) - OAB (overactive bladder)   M62.89 (ICD-10-CM) - Pelvic floor dysfunction  M62.838 (ICD-10-CM) - Levator spasm    Rationale for Evaluation and Treatment Rehabilitation  THERAPY DIAG:  Sacrococcygeal disorders, not elsewhere classified  Pain in thoracic spine  Other low back pain  Other lack of coordination  Other abnormalities of gait and mobility  ONSET DATE:   SUBJECTIVE:                SUBJECTIVE STATEMENT TODAY:                                                           Pt was not able to perform HEP the past week while at a retreat center. Pt noticed she was more constipated while away from her regular schedule.   SUBJECTIVE STATEMENT ON EVAL   06/22/23:  1_ fecal smearing:  occurs 3 x week mostly in the evening after sitting  , changes pads 2-3 x day . Pt has noticed a decreased in fecal leakage after changing to a Mediter[place] Diet and removed caffeine for the past 4 months. Pt also acupuncture visits for TMJ and LBP for incontinence issues for 16 visits from Feb - April with Dr. Ila ( chiropractor).  Prior to these Tx and dietary changes , pt was having fecal leakage during the night and during the day. And going thru 3-5 pads. Daily fluid : 96 fl oz of water  and no alcohol/ coffee. Juice only 2 x week . Denied straining with bowel movements, Uses a bidet.  Daily BMS , once a day mostly with Pt has stool consistency Type 4-5 .   Pt gets rectal spasms at anal sphincter after sitting for long periods like recent car trip. Pt used get these rectal spasms in the past 6 month before she changed her diet   2_LBP non radiating pain:    Sitting for > 1 hr at computer  and church causes LBP 5-6/10 . Pt had this LBP for past 15-20 years.   3_ thoracic pain without radiating pain : Standing  at sink and counter with food prep and cooking 10-15 min   cause thoracic pain 6-7/10 . Pt had this thoracic pain for last 10 years   4.  Urinary leakage occurs sneezing and coughing.    PERTINENT HISTORY:  1 C  section delivery with only child, abdominal hysterectomy  2/2 endometriosis 2002 , laparoscopic surgeries for endometriosis, Dx with endometriosis 2000 Tavlov cysts  ( S1-2)    PAIN:  Are you having pain? See above   PRECAUTIONS: None  WEIGHT BEARING RESTRICTIONS: No  FALLS:  Has patient fallen in last 6 months? 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, feeding animals with hay, mending fences     OBJECTIVE:     [facility] PT Assessment - 08/24/23 1248       Squat   Comments deep squat with farming tasks      Lunges   Comments semi tandem stance with poor priopioception with back foot with poor balance and stability,      Floor to Stand   Comments half kneeling method          [facility] Adult PT Treatment/Exercise - 08/24/23 1245       Therapeutic Activites    Other Therapeutic Activities explained principles of body mechanics with LKC and deep core co-activation to bend, lift overhead, and from ground, and to perform farming tasks with tractor cleaning and hay bails  tasks to minimize excessive up and down movements / minimizing  LBP /midback pain/ pelvic floor dysfunctions      Neuro Re-ed    Neuro Re-ed Details  cued for stand <> floor t/f, bending, lifting overhead with putting jars in cabinet  , cleaning under tractor,  standing a sink,                 HOME EXERCISE PROGRAM: See pt instruction section    ASSESSMENT:  CLINICAL IMPRESSION:   Improvements across the past sessions:   Pt shows corrected alignment of pelvis and spine with levelled shoulders and iliac crest.  LBP and midback have resolved and able to sit for > 30 min at computer and at church.  Pt is  able to cook and prep food without thoracic pain for 1.5 hours compared to pain occurring after 10-15 min .  Pt is able to sit at church and while driving without LBP the past 5 weeks.               Focused today on her farming tasks and  explained principles of body mechanics with LKC and deep core co-activation to bend, lift overhead, and from ground, and to perform farming tasks with tractor cleaning and hay bails  tasks for minimizing  LBP /midback pain/ pelvic floor dysfunctions Pt practiced body mechanics for these tasks with cues for alignment and proprioception to promote pelvic and spinal stability. Pt demo'd correctly with simulated tasks she engages in farm setting.              Anticipate these improvements will help minimize pelvic floor overactivity and  further improve gait and fecal smearing and urinary incontinence through the use of regional interdependence approach.     Regional interdependent approaches will yield greater benefits .     Pt benefits from skilled PT.     OBJECTIVE IMPAIRMENTS decreased activity tolerance, decreased coordination, decreased endurance, decreased mobility, difficulty walking, decreased ROM, decreased strength, decreased safety awareness, hypomobility, increased muscle spasms, impaired flexibility, improper body mechanics, postural dysfunction, and pain. scar restrictions   ACTIVITY LIMITATIONS  self-care,  home chores, work tasks    PARTICIPATION LIMITATIONS:  community, farming activities and Recruitment consultant activities    PERSONAL FACTORS   see pertinent Hx with surgeries above /  post retiurement plans to take for farm with strenuous physical activities are also affecting patient's functional outcome.    REHAB POTENTIAL: Good   CLINICAL DECISION MAKING: Evolving/moderate complexity   EVALUATION COMPLEXITY: Moderate    PATIENT EDUCATION:    Education details: Showed pt anatomy images. Explained muscles attachments/ connection,  physiology of deep  core system/ spinal- thoracic-pelvis-lower kinetic chain as they relate to pt's presentation, Sx, and past Hx. Explained what and how these areas of deficits need to be restored to balance and function    See Therapeutic activity / neuromuscular re-education section  Answered pt's questions.   Person educated: Patient Education method: Explanation, Demonstration, Tactile cues, Verbal cues, and Handouts Education comprehension: verbalized understanding, returned demonstration, verbal cues required, tactile cues required, and needs further education     PLAN: PT FREQUENCY: 1x/week   PT DURATION: 10 weeks   PLANNED INTERVENTIONS: Therapeutic exercises, Therapeutic activity, Neuromuscular re-education, Balance training, Gait training, Patient/Family education, Self Care, Joint mobilization, Spinal mobilization, Moist heat, Taping, and Manual therapy, dry needling.   PLAN FOR NEXT SESSION: See clinical impression for plan     GOALS: Goals reviewed with patient? Yes  SHORT TERM GOALS: Target date: 07/20/2023    Pt will demo IND with HEP                    Baseline: Not IND            Goal status: INITIAL   LONG TERM GOALS: Target date: 08/31/2023    1.Pt will demo proper deep core coordination without chest breathing and optimal excursion of diaphragm/pelvic floor in order to promote spinal stability and pelvic floor function  Baseline: dyscoordination Goal status: Ongoing   2.  Pt will demo proper body mechanics in against gravity tasks and ADLs  farm work  tasks, fitness  to minimize straining pelvic floor / back  issues  Baseline: not IND, improper form that places strain on pelvic floor  Goal status: INITIAL    3. Pt will demo increased gait speed > 1.5 m/s with reciprocal gait pattern, longer stride length  in order to ambulate safely in community and return to fitness routine  Baseline: 1.4 m/s, limited arm swings, limited posterior L thoracic  rotation, excessive pelvic sway to L, abducted feet, ,  Goal status: INITIAL    4. Pt will demo levelled pelvic girdle and shoulder height  and to demo equal downward movement of scapula in order to progress to deep core strengthening HEP and restore mobility at spine, pelvis, gait, posture minimize falls, and improve balance   Baseline: R shoulder and R iliac crest lowered , scapular downward movement : L delayed  Goal status: MET    5. Pt will report being able to prep food and wash dishes for > 30 min with < 2/10 thoracic pain and to be able to sit through church / computer > 1.5 hr with < 2/10 LBP  in order to return community and social activities  Baseline:  Sitting for > 1 hr at computer  and church causes LBP 5-6/10  Standing  at sink and counter with food prep and cooking 10-15 min   cause thoracic pain 6-7/10  Goal status: MET     6. Pt will improve PFDI-7 questionnaire to >10   pts  score change in any categories  to demo improved QOL  Baseline:  125 pts   ( greater pts indicate greater negative impact on QOL)                    38 for UIQ-7 ,  43 for both CRAIQ-7 and POPIQ-7  Goal Status: INITIAL        Pia Lupe Plump, PT 08/24/2023, 9:45 AM

## 2023-08-31 ENCOUNTER — Ambulatory Visit: Payer: BC Managed Care – PPO | Admitting: Physical Therapy

## 2023-08-31 ENCOUNTER — Telehealth: Payer: Self-pay | Admitting: Physical Therapy

## 2023-08-31 ENCOUNTER — Ambulatory Visit: Admitting: Physical Therapy

## 2023-08-31 NOTE — Telephone Encounter (Signed)
 Physical therapist called pt re: missed appt today  Stated next appt is 7/15 @9 :30   We would be grateful if pt can please call to confirm whether they wish to keep or cancel remaining appts [phone].  Remaining appts will be removed after 2 no-show appts per rehab department policy. If pt wants to schedule more appts, they will be scheduled one at a time.

## 2023-09-07 ENCOUNTER — Ambulatory Visit: Payer: BC Managed Care – PPO | Admitting: Physical Therapy

## 2023-09-07 DIAGNOSIS — R278 Other lack of coordination: Secondary | ICD-10-CM

## 2023-09-07 DIAGNOSIS — M546 Pain in thoracic spine: Secondary | ICD-10-CM

## 2023-09-07 DIAGNOSIS — M5459 Other low back pain: Secondary | ICD-10-CM

## 2023-09-07 DIAGNOSIS — M533 Sacrococcygeal disorders, not elsewhere classified: Secondary | ICD-10-CM | POA: Diagnosis not present

## 2023-09-07 DIAGNOSIS — R2689 Other abnormalities of gait and mobility: Secondary | ICD-10-CM

## 2023-09-07 NOTE — Patient Instructions (Signed)
 Walking with more higher knees, push ground away, wider feet under hips, arm swings, slight lean

## 2023-09-07 NOTE — Therapy (Signed)
 OUTPATIENT PHYSICAL THERAPY TREATMENT  /  Recert    Patient Name: Nancy Freeman MRN: [id number] DOB:03/29/1970, 53 y.o., female Today's Date: 09/07/2023   PT End of Session - 09/07/23 0942     Visit Number 8    Number of Visits 18    Date for PT Re-Evaluation 11/16/23    PT Start Time 0941    PT Stop Time 1024    PT Time Calculation (min) 43 min    Activity Tolerance Patient tolerated treatment well;No increased pain    Behavior During Therapy [facility] for tasks assessed/performed          Past Medical History:  Diagnosis Date   ADHD (attention deficit hyperactivity disorder)    ADHD   Allergy    Anginal pain (HCC)    Normal TTE and myocardial perfusion scan 04/24/20   Anxiety    BRCA negative 08/2019   MyRisk neg   Family history of breast cancer 08/2019   IBIS=14.3%/riskscore=14.9%   Family history of ovarian cancer    GERD (gastroesophageal reflux disease)    Headache    migraines  1-2x/month   Hidradenoma VULVAR   Hypertension    IBS (irritable bowel syndrome)    Internal hemorrhoid    Past Surgical History:  Procedure Laterality Date   ABDOMINAL HYSTERECTOMY  01-19-2001  DR CARY   W/ LEFT SALPINGO-OOPHORECTOMY AND EXCISION ENDOMETRIOSIS   BICEPT TENODESIS Right 03/20/2022   Procedure: OPEN BICEPS TENODESIS;  Surgeon: Addie Cordella Hamilton, MD;  Location: [facility];  Service: Orthopedics;  Laterality: Right;   BREAST BIOPSY Left 09/22/2017   affirm bx, pash   CESAREAN SECTION     COLONOSCOPY     COLONOSCOPY WITH PROPOFOL  N/A 05/10/2020   Procedure: COLONOSCOPY WITH PROPOFOL ;  Surgeon: Jinny Carmine, MD;  Location: [facility];  Service: Endoscopy;  Laterality: N/A;   ESOPHAGOGASTRODUODENOSCOPY (EGD) WITH PROPOFOL  N/A 05/10/2020   Procedure: ESOPHAGOGASTRODUODENOSCOPY (EGD) WITH PROPOFOL ;  Surgeon: Jinny Carmine, MD;  Location: [facility];  Service: Endoscopy;  Laterality: N/A;   HEMORRHOID BANDING  2018   POLYPECTOMY N/A 05/10/2020    Procedure: POLYPECTOMY;  Surgeon: Jinny Carmine, MD;  Location: [facility];  Service: Endoscopy;  Laterality: N/A;   REMOVAL OF SIDE PLATE, DISTAL FIBULA, RIGHT ANKLE  05-08-2004   SHOULDER ARTHROSCOPY Right 09/03/2022   Procedure: RIGHT SHOULDER ARTHROSCOPY WITH ROTATOR INTERVAL RELEASE;  Surgeon: Addie Cordella Hamilton, MD;  Location: [facility] OR;  Service: Orthopedics;  Laterality: Right;   SHOULDER ARTHROSCOPY WITH OPEN ROTATOR CUFF REPAIR AND DISTAL CLAVICLE ACROMINECTOMY Right 03/20/2022   Procedure: RIGHT SHOULDER ARTHROSCOPY, POSTERIOR LABRAL REPAIR, DEBRIDEMENT, ARTHROSCOPIC DISTAL CLAVICLE EXCISION,;  Surgeon: Addie Cordella Hamilton, MD;  Location: [facility];  Service: Orthopedics;  Laterality: Right;   SHOULDER CLOSED REDUCTION Right 09/03/2022   Procedure: MANIPULATION UNDER ANESTHESIA;  Surgeon: Addie Cordella Hamilton, MD;  Location: [facility];  Service: Orthopedics;  Laterality: Right;   UPPER GASTROINTESTINAL ENDOSCOPY     Patient Active Problem List   Diagnosis Date Noted   Incontinence of feces 11/24/2022   Urinary incontinence 11/24/2022   Adhesive capsulitis of right shoulder 09/19/2022   Arthritis of right acromioclavicular joint 03/22/2022   Degenerative superior labral anterior-to-posterior (SLAP) tear of right shoulder 03/22/2022   Biceps tendonitis on right 03/22/2022   Tear of right glenoid labrum 03/22/2022   Chronic right shoulder pain 01/19/2022   History of colonic polyps    Polyp of sigmoid colon    Gastroesophageal reflux  disease without esophagitis    BRCA negative 10/13/2019   Family history of ovarian cancer 09/18/2019   Prolapsed internal hemorrhoids, grade 2 05/19/2016   PCP: [facility]   REFERRING PROVIDER: Zuleta,   REFERRING DIAG:  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  R15.1 (ICD-10-CM) - Fecal smearing  N39.41 (ICD-10-CM) - Urge incontinence of urine  R35.0 (ICD-10-CM) - Urinary frequency  N32.81 (ICD-10-CM) - OAB (overactive  bladder)  M62.89 (ICD-10-CM) - Pelvic floor dysfunction  M62.838 (ICD-10-CM) - Levator spasm    Rationale for Evaluation and Treatment Rehabilitation  THERAPY DIAG:  Sacrococcygeal disorders, not elsewhere classified  Pain in thoracic spine  Other low back pain  Other lack of coordination  Other abnormalities of gait and mobility  ONSET DATE:   SUBJECTIVE:                SUBJECTIVE STATEMENT TODAY:                                                           Pt has reported Pt has had no fecal smearing for the past 2 months since starting PT. Pt no longer has to take a extra change of clothes and a diaper when traveling because she was afraid she would have a leakage accident with stool and urine. I feel ike I have my life back. Pt had left work end of March due to these issues because it was so disruptive to her work to the point where she had accidents on zoom calls.  Pt no longer plans her travel routes, plan her food to avoid fecal and urinary accidents. I am past that.   SUBJECTIVE STATEMENT ON EVAL   06/22/23:  1_ fecal smearing:  occurs 3 x week mostly in the evening after sitting  , changes pads 2-3 x day . Pt has noticed a decreased in fecal leakage after changing to a [place]iter[place] Diet and removed caffeine for the past 4 months. Pt also acupuncture visits for TMJ and LBP for incontinence issues for 16 visits from Feb - April with Dr. Ila ( chiropractor).  Prior to these Tx and dietary changes , pt was having fecal leakage during the night and during the day. And going thru 3-5 pads. Daily fluid : 96 fl oz of water  and no alcohol/ coffee. Juice only 2 x week . Denied straining with bowel movements, Uses a bidet.  Daily BMS , once a day mostly with Pt has stool consistency Type 4-5 .   Pt gets rectal spasms at anal sphincter after sitting for long periods like recent car trip. Pt used get these rectal spasms in the past 6 month before she changed her diet   2_LBP non  radiating pain:    Sitting for > 1 hr at computer  and church causes LBP 5-6/10 . Pt had this LBP for past 15-20 years.   3_ thoracic pain without radiating pain : Standing  at sink and counter with food prep and cooking 10-15 min   cause thoracic pain 6-7/10 . Pt had this thoracic pain for last 10 years   4.  Urinary leakage occurs sneezing and coughing.    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 See above   PRECAUTIONS: None  WEIGHT BEARING RESTRICTIONS: No  FALLS:  Has patient fallen in last 6 months? 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, feeding animals with hay, mending fences     OBJECTIVE:     [facility] PT Assessment - 09/07/23 1710       Posture/Postural Control   Posture Comments able to catch herself when in adducted knees      Ambulation/Gait   Gait Comments 1.58 m/s, armswings and reciprocal gait          [facility] Adult PT Treatment/Exercise - 09/07/23 1710       Self-Care   Self-Care Other Self-Care Comments    Other Self-Care Comments  explained future session for pelvic floor assessment to decide on whether kegel trianing is improvement or reverse kegel program is appropiate to mimiize risk for prolapse given Hx of hysterectomy . Pt agreed to internal pelvic assessment next session.   Reassessed goals     Neuro Re-ed    Neuro Re-ed Details  cued for more ant COM, higher knees, longer stride in gait to return to walking routine and minimize Sx, practiced with tactile cue with  resistance band at waist to create posterior pertubation to promote more anteiror COM and improved deep core activation with walking                HOME EXERCISE PROGRAM: See pt instruction section    ASSESSMENT:  CLINICAL IMPRESSION:  Pt has met 8/9 goals and progressing well towards remaining goals.  Improvements across the past sessions:   Pt has had no fecal smearing for the past 2 months since starting PT. Pt no longer has to take a extra change of clothes and a diaper when traveling because she was afraid she would have a leakage accident with stool and urine. I feel ike I have my life back. Pt had left work end of March due to these issues because it was so disruptive to her work to the point where she had accidents on zoom calls.  Pt no longer plans her travel routes, plan her food to avoid fecal and urinary accidents. I am past that.   Pt shows corrected alignment of pelvis and spine with levelled shoulders and iliac crest.  LBP and midback have resolved and able to sit for > 30 min at computer and at church.  Pt is able to cook and prep food without thoracic pain for 1.5 hours compared to pain occurring after 10-15 min Pt is able to sit at church and while driving without LBP the past 5 weeks.  Improved PIFQ-7 scores, indication functional improvements              Anticipate these improvements will help minimize pelvic floor overactivity and  further improve gait and fecal smearing and urinary incontinence through the use of regional interdependence approach.              Initiated gait training as pt wants to return to walking for fitness, Cued for more ant COM, higher knees, longer stride in gait to return  to walking routine and minimize Sx, practiced with tactile cue with resistance band at waist to create posterior pertubation to promote more anteiror COM and improved deep core activation with walking  Explained future session for pelvic floor assessment to  decide on whether kegel trianing is improvement or reverse kegel program is appropiate to mimiize risk for prolapse given Hx of hysterectomy . Pt agreed to internal pelvic assessment next session.             Regional interdependent approaches will yield greater benefits .     Pt benefits from skilled PT.     OBJECTIVE IMPAIRMENTS decreased activity tolerance, decreased coordination, decreased endurance, decreased mobility, difficulty walking, decreased ROM, decreased strength, decreased safety awareness, hypomobility, increased muscle spasms, impaired flexibility, improper body mechanics, postural dysfunction, and pain. scar restrictions   ACTIVITY LIMITATIONS  self-care,  home chores, work tasks    PARTICIPATION LIMITATIONS:  community, farming activities and Recruitment consultant activities    PERSONAL FACTORS   see pertinent Hx with surgeries above /  post retiurement plans to take for farm with strenuous physical activities are also affecting patient's functional outcome.    REHAB POTENTIAL: Good   CLINICAL DECISION MAKING: Evolving/moderate complexity   EVALUATION COMPLEXITY: Moderate    PATIENT EDUCATION:    Education details: Showed pt anatomy images. Explained muscles attachments/ connection, physiology of deep core system/ spinal- thoracic-pelvis-lower kinetic chain as they relate to pt's presentation, Sx, and past Hx. Explained what and how these areas of deficits need to be restored to balance and function    See Therapeutic activity / neuromuscular re-education section  Answered pt's questions.   Person educated: Patient Education method: Explanation, Demonstration, Tactile cues, Verbal cues, and Handouts Education comprehension: verbalized understanding, returned demonstration, verbal cues required, tactile cues required, and needs further education     PLAN: PT FREQUENCY: 1x/week   PT DURATION: 10 weeks   PLANNED INTERVENTIONS: Therapeutic exercises, Therapeutic  activity, Neuromuscular re-education, Balance training, Gait training, Patient/Family education, Self Care, Joint mobilization, Spinal mobilization, Moist heat, Taping, and Manual therapy, dry needling.   PLAN FOR NEXT SESSION: See clinical impression for plan     GOALS: Goals reviewed with patient? Yes  SHORT TERM GOALS: Target date: 07/20/2023    Pt will demo IND with HEP                    Baseline: Not IND            Goal status: INITIAL   LONG TERM GOALS: Target date: 11/16/2023      1.Pt will demo proper deep core coordination without chest breathing and optimal excursion of diaphragm/pelvic floor in order to promote spinal stability and pelvic floor function  Baseline: dyscoordination Goal status:  MET   2.  Pt will demo proper body mechanics in against gravity tasks and ADLs  farm work  tasks, fitness  to minimize straining pelvic floor / back  issues  Baseline: not IND, improper form that places strain on pelvic floor  Goal status Ongoing    3. Pt will demo increased gait speed > 1.5 m/s with reciprocal gait pattern, longer stride length  in order to ambulate safely in community and return to fitness routine  Baseline: 1.4 m/s, limited arm swings, limited posterior L thoracic rotation, excessive pelvic sway to L, abducted feet, ,  Goal status: MET ( 09/07/23:  1.58 m/s , reciprocal)      4. Pt will demo levelled pelvic girdle  and shoulder height  and to demo equal downward movement of scapula in order to progress to deep core strengthening HEP and restore mobility at spine, pelvis, gait, posture minimize falls, and improve balance   Baseline: R shoulder and R iliac crest lowered , scapular downward movement : L delayed  Goal status: MET    5. Pt will report being able to prep food and wash dishes for > 30 min with < 2/10 thoracic pain and to be able to sit through church / computer > 1.5 hr with < 2/10 LBP  in order to return community and social activities   Baseline:  Sitting for > 1 hr at computer  and church causes LBP 5-6/10  Standing  at sink and counter with food prep and cooking 10-15 min   cause thoracic pain 6-7/10  Goal status: MET     6. Pt will improve PFDI-7 questionnaire to >10   pts  score change in any categories  to demo improved QOL  Baseline:  125 pts   ( greater pts indicate greater negative impact on QOL)                    38 for UIQ-7 ,  43 for both CRAIQ-7 and POPIQ-7  Goal Status:  MET  ( 09/07/23 :  9 pts, total,  1 pt UIQ-7, 8 pts CRAIQ-7,  2 pt POPQ)    7.  Fecal smearing will decrease to < 1 x week  after sitting and pt will report no more need for changing pads  Baseline: occurs 3 x week mostly in the evening after sitting  , changes pads 2-3 x day Goal Status: MET ( 09/07/23:  Pt no longer has to take a extra change of clothes and a diaper when traveling because she was afraid she would have a leakage accident with stool and urine.)   8. Pt will be able to make it to the bathroom with sneezing spells and no leakage after sneezing  Baseline: Urinary leakage occurs 100% of the time with  sneezing and coughing and leakage before making to the toilet when sneezing at the sink   Goal STATUS:  MET   9. Pt will demo proper sequential and circumferential coordination of 3 layers of pelvic floor for upward and lengthening to minimize risk for prolapse and relapse of Sx Baseline:' plan to assess internal pelvic floor next session GOAL STATUS: INITIAL    Pia Lupe Plump, PT 09/07/2023, 9:43 AM

## 2023-09-14 ENCOUNTER — Ambulatory Visit: Payer: BC Managed Care – PPO | Admitting: Physical Therapy

## 2023-09-14 DIAGNOSIS — R278 Other lack of coordination: Secondary | ICD-10-CM

## 2023-09-14 DIAGNOSIS — M546 Pain in thoracic spine: Secondary | ICD-10-CM

## 2023-09-14 DIAGNOSIS — R2689 Other abnormalities of gait and mobility: Secondary | ICD-10-CM

## 2023-09-14 DIAGNOSIS — M533 Sacrococcygeal disorders, not elsewhere classified: Secondary | ICD-10-CM

## 2023-09-14 DIAGNOSIS — M5459 Other low back pain: Secondary | ICD-10-CM

## 2023-09-14 NOTE — Therapy (Signed)
 OUTPATIENT PHYSICAL THERAPY TREATMENT     Patient Name: Nancy Freeman MRN: [id number] DOB:March 05, 1970, 53 y.o., female Today's Date: 09/14/2023   PT End of Session - 09/14/23 0951     Visit Number 9    Number of Visits 18    Date for PT Re-Evaluation 11/16/23    PT Start Time 0953    PT Stop Time 1017    PT Time Calculation (min) 24 min    Activity Tolerance Patient tolerated treatment well;No increased pain    Behavior During Therapy [facility] for tasks assessed/performed          Past Medical History:  Diagnosis Date   ADHD (attention deficit hyperactivity disorder)    ADHD   Allergy    Anginal pain (HCC)    Normal TTE and myocardial perfusion scan 04/24/20   Anxiety    BRCA negative 08/2019   MyRisk neg   Family history of breast cancer 08/2019   IBIS=14.3%/riskscore=14.9%   Family history of ovarian cancer    GERD (gastroesophageal reflux disease)    Headache    migraines  1-2x/month   Hidradenoma VULVAR   Hypertension    IBS (irritable bowel syndrome)    Internal hemorrhoid    Past Surgical History:  Procedure Laterality Date   ABDOMINAL HYSTERECTOMY  01-19-2001  DR CARY   W/ LEFT SALPINGO-OOPHORECTOMY AND EXCISION ENDOMETRIOSIS   BICEPT TENODESIS Right 03/20/2022   Procedure: OPEN BICEPS TENODESIS;  Surgeon: Addie Cordella Hamilton, MD;  Location: [facility];  Service: Orthopedics;  Laterality: Right;   BREAST BIOPSY Left 09/22/2017   affirm bx, pash   CESAREAN SECTION     COLONOSCOPY     COLONOSCOPY WITH PROPOFOL  N/A 05/10/2020   Procedure: COLONOSCOPY WITH PROPOFOL ;  Surgeon: Jinny Carmine, MD;  Location: [facility];  Service: Endoscopy;  Laterality: N/A;   ESOPHAGOGASTRODUODENOSCOPY (EGD) WITH PROPOFOL  N/A 05/10/2020   Procedure: ESOPHAGOGASTRODUODENOSCOPY (EGD) WITH PROPOFOL ;  Surgeon: Jinny Carmine, MD;  Location: [facility];  Service: Endoscopy;  Laterality: N/A;   HEMORRHOID BANDING  2018   POLYPECTOMY N/A 05/10/2020   Procedure:  POLYPECTOMY;  Surgeon: Jinny Carmine, MD;  Location: [facility];  Service: Endoscopy;  Laterality: N/A;   REMOVAL OF SIDE PLATE, DISTAL FIBULA, RIGHT ANKLE  05-08-2004   SHOULDER ARTHROSCOPY Right 09/03/2022   Procedure: RIGHT SHOULDER ARTHROSCOPY WITH ROTATOR INTERVAL RELEASE;  Surgeon: Addie Cordella Hamilton, MD;  Location: [facility];  Service: Orthopedics;  Laterality: Right;   SHOULDER ARTHROSCOPY WITH OPEN ROTATOR CUFF REPAIR AND DISTAL CLAVICLE ACROMINECTOMY Right 03/20/2022   Procedure: RIGHT SHOULDER ARTHROSCOPY, POSTERIOR LABRAL REPAIR, DEBRIDEMENT, ARTHROSCOPIC DISTAL CLAVICLE EXCISION,;  Surgeon: Addie Cordella Hamilton, MD;  Location: [facility];  Service: Orthopedics;  Laterality: Right;   SHOULDER CLOSED REDUCTION Right 09/03/2022   Procedure: MANIPULATION UNDER ANESTHESIA;  Surgeon: Addie Cordella Hamilton, MD;  Location: [facility];  Service: Orthopedics;  Laterality: Right;   UPPER GASTROINTESTINAL ENDOSCOPY     Patient Active Problem List   Diagnosis Date Noted   Incontinence of feces 11/24/2022   Urinary incontinence 11/24/2022   Adhesive capsulitis of right shoulder 09/19/2022   Arthritis of right acromioclavicular joint 03/22/2022   Degenerative superior labral anterior-to-posterior (SLAP) tear of right shoulder 03/22/2022   Biceps tendonitis on right 03/22/2022   Tear of right glenoid labrum 03/22/2022   Chronic right shoulder pain 01/19/2022   History of colonic polyps    Polyp of sigmoid colon    Gastroesophageal reflux disease without esophagitis  BRCA negative 10/13/2019   Family history of ovarian cancer 09/18/2019   Prolapsed internal hemorrhoids, grade 2 05/19/2016   PCP: [facility]   REFERRING PROVIDER: Zuleta,   REFERRING DIAG:  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  R15.1 (ICD-10-CM) - Fecal smearing  N39.41 (ICD-10-CM) - Urge incontinence of urine  R35.0 (ICD-10-CM) - Urinary frequency  N32.81 (ICD-10-CM) - OAB (overactive bladder)   M62.89 (ICD-10-CM) - Pelvic floor dysfunction  M62.838 (ICD-10-CM) - Levator spasm    Rationale for Evaluation and Treatment Rehabilitation  THERAPY DIAG:  Sacrococcygeal disorders, not elsewhere classified  Pain in thoracic spine  Other low back pain  Other lack of coordination  Other abnormalities of gait and mobility  ONSET DATE:   SUBJECTIVE:                SUBJECTIVE STATEMENT TODAY:                                                           Pt has reported she has been trying to be aware when she turns her knee inward. She noticed she sits with knees together when she feels cold.   SUBJECTIVE STATEMENT ON EVAL   06/22/23:  1_ fecal smearing:  occurs 3 x week mostly in the evening after sitting  , changes pads 2-3 x day . Pt has noticed a decreased in fecal leakage after changing to a Mediter[place] Diet and removed caffeine for the past 4 months. Pt also acupuncture visits for TMJ and LBP for incontinence issues for 16 visits from Feb - April with Dr. Ila ( chiropractor).  Prior to these Tx and dietary changes , pt was having fecal leakage during the night and during the day. And going thru 3-5 pads. Daily fluid : 96 fl oz of water  and no alcohol/ coffee. Juice only 2 x week . Denied straining with bowel movements, Uses a bidet.  Daily BMS , once a day mostly with Pt has stool consistency Type 4-5 .   Pt gets rectal spasms at anal sphincter after sitting for long periods like recent car trip. Pt used get these rectal spasms in the past 6 month before she changed her diet   2_LBP non radiating pain:    Sitting for > 1 hr at computer  and church causes LBP 5-6/10 . Pt had this LBP for past 15-20 years.   3_ thoracic pain without radiating pain : Standing  at sink and counter with food prep and cooking 10-15 min   cause thoracic pain 6-7/10 . Pt had this thoracic pain for last 10 years   4.  Urinary leakage occurs sneezing and coughing.    PERTINENT HISTORY:  1 C section  delivery with only child, abdominal hysterectomy  2/2 endometriosis 2002 , laparoscopic surgeries for endometriosis, Dx with endometriosis 2000 Tavlov cysts  ( S1-2)    PAIN:  Are you having pain? See above   PRECAUTIONS: None  WEIGHT BEARING RESTRICTIONS: No  FALLS:  Has patient fallen in last 6 months? 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   hooklying: tightness along L anterior mm, MMT 2/5,  post training and Tx: improved circumferential/ sequential with exhalation , withholding kegel contraction ,          [facility] Adult PT Treatment/Exercise - 09/14/23 1015       Therapeutic Activites    Other Therapeutic Activities explained the role of LKC affecting pelvic organ position and cued for standing position to maintain upward position of posterior vaginal wall      Neuro Re-ed    Neuro Re-ed Details  excessive cues for less ab overuse,  propicoeption for anterior tilt of pelvis, toe abduction and ext to relax pelvic floor in hooklying position and with deep core coordination,              HOME EXERCISE PROGRAM: See pt instruction section    ASSESSMENT:  CLINICAL IMPRESSION:  Pt has met 8/9 goals and progressing well towards remaining goals.  Improvements across the past sessions:   Pt has had no fecal smearing for the past 2 months since starting PT. Pt no longer has to take a extra change of  clothes and a diaper when traveling because she was afraid she would have a leakage accident with stool and urine. I feel ike I have my life back. Pt had left work end of March due to these issues because it was so disruptive to her work to the point where she had accidents on zoom calls.  Pt no longer plans her travel routes, plan her food to avoid fecal and urinary accidents. I am past that.   Pt shows corrected alignment of pelvis and spine with levelled shoulders and iliac crest.  LBP and midback have resolved and able to sit for > 30 min at computer and at church.  Pt is able to cook and prep food without thoracic pain for 1.5 hours compared to pain occurring after 10-15 min Pt is able to sit at church and while driving without LBP the past 5 weeks.  Improved PIFQ-7 scores, indication functional improvements            Anticipate these improvements will help minimize pelvic floor overactivity and  further improve gait and fecal smearing and urinary incontinence through the use of regional interdependence approach.    Initiated internal pelvic floor assessment today with pt's verbal consent. Pt demo'd posterior vaginal wall lowered but inside introitus, poor coordination of pelvic floor mm layers with cue for contraction, tightness along L anterior wall > R,  Post Tx, pt demo'd improved coordination circumferentially and sequentially of 3 pelvic floor mm layers and upward position of posterior vaginal wall. Plan to continue to improve coordination next session. Withholding kegel training due to pt showing more upward movement of pelvic floor with exhalation/ deep core training compared to cues for quick contraction.       Abbreviated session today due late start to pt's sessions because of mistake with front desk staff not checking pt in when pt arrived.   Regional interdependent approaches will yield greater benefits .     Pt benefits from skilled PT.     OBJECTIVE  IMPAIRMENTS  decreased activity tolerance, decreased coordination, decreased endurance, decreased mobility, difficulty walking, decreased ROM, decreased strength, decreased safety awareness, hypomobility, increased muscle spasms, impaired flexibility, improper body mechanics, postural dysfunction, and pain. scar restrictions   ACTIVITY LIMITATIONS  self-care,  home chores, work tasks    PARTICIPATION LIMITATIONS:  community, farming activities and Recruitment consultant activities    PERSONAL FACTORS   see pertinent Hx with surgeries above /  post retiurement plans to take for farm with strenuous physical activities are also affecting patient's functional outcome.    REHAB POTENTIAL: Good   CLINICAL DECISION MAKING: Evolving/moderate complexity   EVALUATION COMPLEXITY: Moderate    PATIENT EDUCATION:    Education details: Showed pt anatomy images. Explained muscles attachments/ connection, physiology of deep core system/ spinal- thoracic-pelvis-lower kinetic chain as they relate to pt's presentation, Sx, and past Hx. Explained what and how these areas of deficits need to be restored to balance and function    See Therapeutic activity / neuromuscular re-education section  Answered pt's questions.   Person educated: Patient Education method: Explanation, Demonstration, Tactile cues, Verbal cues, and Handouts Education comprehension: verbalized understanding, returned demonstration, verbal cues required, tactile cues required, and needs further education     PLAN: PT FREQUENCY: 1x/week   PT DURATION: 10 weeks   PLANNED INTERVENTIONS: Therapeutic exercises, Therapeutic activity, Neuromuscular re-education, Balance training, Gait training, Patient/Family education, Self Care, Joint mobilization, Spinal mobilization, Moist heat, Taping, and Manual therapy, dry needling.   PLAN FOR NEXT SESSION: See clinical impression for plan     GOALS: Goals reviewed with patient? Yes  SHORT TERM GOALS: Target date:  07/20/2023    Pt will demo IND with HEP                    Baseline: Not IND            Goal status: INITIAL   LONG TERM GOALS: Target date: 11/16/2023      1.Pt will demo proper deep core coordination without chest breathing and optimal excursion of diaphragm/pelvic floor in order to promote spinal stability and pelvic floor function  Baseline: dyscoordination Goal status:  MET   2.  Pt will demo proper body mechanics in against gravity tasks and ADLs  farm work  tasks, fitness  to minimize straining pelvic floor / back  issues  Baseline: not IND, improper form that places strain on pelvic floor  Goal status Ongoing    3. Pt will demo increased gait speed > 1.5 m/s with reciprocal gait pattern, longer stride length  in order to ambulate safely in community and return to fitness routine  Baseline: 1.4 m/s, limited arm swings, limited posterior L thoracic rotation, excessive pelvic sway to L, abducted feet, ,  Goal status: MET ( 09/07/23:  1.58 m/s , reciprocal)      4. Pt will demo levelled pelvic girdle and shoulder height  and to demo equal downward movement of scapula in order to progress to deep core strengthening HEP and restore mobility at spine, pelvis, gait, posture minimize falls, and improve balance   Baseline: R shoulder and R iliac crest lowered , scapular downward movement : L delayed  Goal status: MET    5. Pt will report being able to prep food and wash dishes for > 30 min with < 2/10 thoracic pain and to be able to sit through church / computer > 1.5 hr with < 2/10 LBP  in order to return community and social activities  Baseline:  Sitting for > 1 hr at computer  and church causes LBP 5-6/10  Standing  at sink and counter with food prep and cooking 10-15 min   cause thoracic pain 6-7/10  Goal status: MET     6. Pt will improve PFDI-7 questionnaire to >10   pts  score change in any categories  to demo improved QOL  Baseline:  125 pts   ( greater pts indicate  greater negative impact on QOL)                    38 for UIQ-7 ,  43 for both CRAIQ-7 and POPIQ-7  Goal Status:  MET  ( 09/07/23 :  9 pts, total,  1 pt UIQ-7, 8 pts CRAIQ-7,  2 pt POPQ)    7.  Fecal smearing will decrease to < 1 x week  after sitting and pt will report no more need for changing pads  Baseline: occurs 3 x week mostly in the evening after sitting  , changes pads 2-3 x day Goal Status: MET ( 09/07/23:  Pt no longer has to take a extra change of clothes and a diaper when traveling because she was afraid she would have a leakage accident with stool and urine.)   8. Pt will be able to make it to the bathroom with sneezing spells and no leakage after sneezing  Baseline: Urinary leakage occurs 100% of the time with  sneezing and coughing and leakage before making to the toilet when sneezing at the sink   Goal STATUS:  MET   9. Pt will demo proper sequential and circumferential coordination of 3 layers of pelvic floor for upward and lengthening to minimize risk for prolapse and relapse of Sx Baseline:' plan to assess internal pelvic floor next session GOAL STATUS: INITIAL    Pia Lupe Plump, PT 09/14/2023, 10:22 AM

## 2023-09-21 ENCOUNTER — Ambulatory Visit: Payer: BC Managed Care – PPO | Admitting: Physical Therapy

## 2023-09-21 DIAGNOSIS — M533 Sacrococcygeal disorders, not elsewhere classified: Secondary | ICD-10-CM | POA: Diagnosis not present

## 2023-09-21 DIAGNOSIS — M546 Pain in thoracic spine: Secondary | ICD-10-CM

## 2023-09-21 DIAGNOSIS — R278 Other lack of coordination: Secondary | ICD-10-CM

## 2023-09-21 DIAGNOSIS — R2689 Other abnormalities of gait and mobility: Secondary | ICD-10-CM

## 2023-09-21 DIAGNOSIS — M5459 Other low back pain: Secondary | ICD-10-CM

## 2023-09-21 NOTE — Therapy (Signed)
 OUTPATIENT PHYSICAL THERAPY TREATMENT     Patient Name: Nancy Freeman MRN: [id number] DOB:[birth year], 53 y.o., female Today's Date: 09/21/2023   PT End of Session - 09/21/23 0948     Visit Number 10    Number of Visits 18    Date for PT Re-Evaluation 11/16/23    PT Start Time 0933    PT Stop Time 1015    PT Time Calculation (min) 42 min    Activity Tolerance Patient tolerated treatment well;No increased pain    Behavior During Therapy [facility] for tasks assessed/performed          Past Medical History:  Diagnosis Date   ADHD (attention deficit hyperactivity disorder)    ADHD   Allergy    Anginal pain (HCC)    Normal TTE and myocardial perfusion scan 04/24/20   Anxiety    BRCA negative 08/2019   MyRisk neg   Family history of breast cancer 08/2019   IBIS=14.3%/riskscore=14.9%   Family history of ovarian cancer    GERD (gastroesophageal reflux disease)    Headache    migraines  1-2x/month   Hidradenoma VULVAR   Hypertension    IBS (irritable bowel syndrome)    Internal hemorrhoid    Past Surgical History:  Procedure Laterality Date   ABDOMINAL HYSTERECTOMY  01-19-2001  DR CARY   W/ LEFT SALPINGO-OOPHORECTOMY AND EXCISION ENDOMETRIOSIS   BICEPT TENODESIS Right 03/20/2022   Procedure: OPEN BICEPS TENODESIS;  Surgeon: Addie Cordella Hamilton, MD;  Location: [facility];  Service: Orthopedics;  Laterality: Right;   BREAST BIOPSY Left 09/22/2017   affirm bx, pash   CESAREAN SECTION     COLONOSCOPY     COLONOSCOPY WITH PROPOFOL  N/A 05/10/2020   Procedure: COLONOSCOPY WITH PROPOFOL ;  Surgeon: Jinny Carmine, MD;  Location: [facility];  Service: Endoscopy;  Laterality: N/A;   ESOPHAGOGASTRODUODENOSCOPY (EGD) WITH PROPOFOL  N/A 05/10/2020   Procedure: ESOPHAGOGASTRODUODENOSCOPY (EGD) WITH PROPOFOL ;  Surgeon: Jinny Carmine, MD;  Location: [facility];  Service: Endoscopy;  Laterality: N/A;   HEMORRHOID BANDING  2018   POLYPECTOMY N/A 05/10/2020    Procedure: POLYPECTOMY;  Surgeon: Jinny Carmine, MD;  Location: [facility];  Service: Endoscopy;  Laterality: N/A;   REMOVAL OF SIDE PLATE, DISTAL FIBULA, RIGHT ANKLE  05-08-2004   SHOULDER ARTHROSCOPY Right 09/03/2022   Procedure: RIGHT SHOULDER ARTHROSCOPY WITH ROTATOR INTERVAL RELEASE;  Surgeon: Addie Cordella Hamilton, MD;  Location: [facility];  Service: Orthopedics;  Laterality: Right;   SHOULDER ARTHROSCOPY WITH OPEN ROTATOR CUFF REPAIR AND DISTAL CLAVICLE ACROMINECTOMY Right 03/20/2022   Procedure: RIGHT SHOULDER ARTHROSCOPY, POSTERIOR LABRAL REPAIR, DEBRIDEMENT, ARTHROSCOPIC DISTAL CLAVICLE EXCISION,;  Surgeon: Addie Cordella Hamilton, MD;  Location: [facility];  Service: Orthopedics;  Laterality: Right;   SHOULDER CLOSED REDUCTION Right 09/03/2022   Procedure: MANIPULATION UNDER ANESTHESIA;  Surgeon: Addie Cordella Hamilton, MD;  Location: [facility];  Service: Orthopedics;  Laterality: Right;   UPPER GASTROINTESTINAL ENDOSCOPY     Patient Active Problem List   Diagnosis Date Noted   Incontinence of feces 11/24/2022   Urinary incontinence 11/24/2022   Adhesive capsulitis of right shoulder 09/19/2022   Arthritis of right acromioclavicular joint 03/22/2022   Degenerative superior labral anterior-to-posterior (SLAP) tear of right shoulder 03/22/2022   Biceps tendonitis on right 03/22/2022   Tear of right glenoid labrum 03/22/2022   Chronic right shoulder pain 01/19/2022   History of colonic polyps    Polyp of sigmoid colon    Gastroesophageal reflux disease without esophagitis  BRCA negative 10/13/2019   Family history of ovarian cancer 09/18/2019   Prolapsed internal hemorrhoids, grade 2 05/19/2016   PCP: [facility]   REFERRING PROVIDER: Zuleta,   REFERRING DIAG:  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  R15.1 (ICD-10-CM) - Fecal smearing  N39.41 (ICD-10-CM) - Urge incontinence of urine  R35.0 (ICD-10-CM) - Urinary frequency  N32.81 (ICD-10-CM) - OAB (overactive  bladder)  M62.89 (ICD-10-CM) - Pelvic floor dysfunction  M62.838 (ICD-10-CM) - Levator spasm    Rationale for Evaluation and Treatment Rehabilitation  THERAPY DIAG:  Sacrococcygeal disorders, not elsewhere classified  Pain in thoracic spine  Other low back pain  Other lack of coordination  Other abnormalities of gait and mobility  ONSET DATE:   SUBJECTIVE:                SUBJECTIVE STATEMENT TODAY:                                                           Pt has reported she has been trying to catch herself not bringing her knees today . Pt noticed sharpness and jabs in her feet. She felt fine after internal pelvic exam and Tx and had no pain nor soreness  SUBJECTIVE STATEMENT ON EVAL   06/22/23:  1_ fecal smearing:  occurs 3 x week mostly in the evening after sitting  , changes pads 2-3 x day . Pt has noticed a decreased in fecal leakage after changing to a Mediterrean Diet and removed caffeine for the past 4 months. Pt also acupuncture visits for TMJ and LBP for incontinence issues for 16 visits from Feb - April with Dr. Ila ( chiropractor).  Prior to these Tx and dietary changes , pt was having fecal leakage during the night and during the day. And going thru 3-5 pads. Daily fluid : 96 fl oz of water  and no alcohol/ coffee. Juice only 2 x week . Denied straining with bowel movements, Uses a bidet.  Daily BMS , once a day mostly with Pt has stool consistency Type 4-5 .   Pt gets rectal spasms at anal sphincter after sitting for long periods like recent car trip. Pt used get these rectal spasms in the past 6 month before she changed her diet   2_LBP non radiating pain:    Sitting for > 1 hr at computer  and church causes LBP 5-6/10 . Pt had this LBP for past 15-20 years.   3_ thoracic pain without radiating pain : Standing  at sink and counter with food prep and cooking 10-15 min   cause thoracic pain 6-7/10 . Pt had this thoracic pain for last 10 years   4.  Urinary leakage  occurs sneezing and coughing.    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 See above   PRECAUTIONS: None  WEIGHT BEARING RESTRICTIONS: No  FALLS:  Has patient fallen in last 6 months? 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, feeding animals with hay, mending fences     OBJECTIVE:     [facility] PT Assessment - 09/21/23 1331       Posture/Postural Control   Posture Comments less adducted knees but poor porioception of feet with knee alignment in seated position          Pelvic Floor Special Questions - 09/21/23 1332     Pelvic Floor Internal Exam pt consented verbally, without contraindications    Exam Type Vaginal    Palpation 2 quick contraction MMT 3/5 after manual Tx helped to decrease tightness along 1-3 o'clock areas at 3 rd layer        '  [facility] Adult PT Treatment/Exercise - 09/21/23 1331       Self-Care   Other Self-Care Comments  explained knee and fett propioception and cued for  to coactivate pelvic floor      Neuro Re-ed    Neuro Re-ed Details  cued for quick kegel contraction and how to add to deep core practice to improve prolapse further      Manual Therapy   Manual therapy comments STM/MW at problem area to promote more upward contraction of pelvic floor for quick kegel training                HOME EXERCISE PROGRAM: See pt instruction section    ASSESSMENT:  CLINICAL IMPRESSION:  Pt has met 8/9 goals and progressing well towards remaining goals.  Improvements  across the past sessions:   Pt has had no fecal smearing for the past 2 months since starting PT. Pt no longer has to take a extra change of clothes and a diaper when traveling because she was afraid she would have a leakage accident with stool and urine. I feel ike I have my life back. Pt had left work end of March due to these issues because it was so disruptive to her work to the point where she had accidents on zoom calls.  Pt no longer plans her travel routes, plan her food to avoid fecal and urinary accidents. I am past that.   Pt shows corrected alignment of pelvis and spine with levelled shoulders and iliac crest.  LBP and midback have resolved and able to sit for > 30 min at computer and at church.  Pt is able to cook and prep food without thoracic pain for 1.5 hours compared to pain occurring after 10-15 min Pt is able to sit at church and while driving without LBP the past 5 weeks.  Improved PIFQ-7 scores, indication functional improvements            Anticipate these improvements will help minimize pelvic floor overactivity and  further improve gait and fecal smearing and urinary incontinence through the use of regional interdependence approach.      Today continued to focus on decreasing pelvic floor tightness at L anterior mm  and progressed to quick kegel contractions which pt demo'd correctly with proper coordination with cues.  Cued to add to deep core practice to improve prolapse further and   explained knee and fett propioception and cued for  to coactivate pelvic floor . Plan to progress to long kegel contractions at next session  Regional interdependent approaches will yield greater benefits .     Pt benefits from skilled PT.     OBJECTIVE IMPAIRMENTS decreased activity tolerance,  decreased coordination, decreased endurance, decreased mobility, difficulty walking, decreased ROM, decreased strength, decreased safety awareness, hypomobility, increased muscle spasms,  impaired flexibility, improper body mechanics, postural dysfunction, and pain. scar restrictions   ACTIVITY LIMITATIONS  self-care,  home chores, work tasks    PARTICIPATION LIMITATIONS:  community, farming activities and Recruitment consultant activities    PERSONAL FACTORS   see pertinent Hx with surgeries above /  post retiurement plans to take for farm with strenuous physical activities are also affecting patient's functional outcome.    REHAB POTENTIAL: Good   CLINICAL DECISION MAKING: Evolving/moderate complexity   EVALUATION COMPLEXITY: Moderate    PATIENT EDUCATION:    Education details: Showed pt anatomy images. Explained muscles attachments/ connection, physiology of deep core system/ spinal- thoracic-pelvis-lower kinetic chain as they relate to pt's presentation, Sx, and past Hx. Explained what and how these areas of deficits need to be restored to balance and function    See Therapeutic activity / neuromuscular re-education section  Answered pt's questions.   Person educated: Patient Education method: Explanation, Demonstration, Tactile cues, Verbal cues, and Handouts Education comprehension: verbalized understanding, returned demonstration, verbal cues required, tactile cues required, and needs further education     PLAN: PT FREQUENCY: 1x/week   PT DURATION: 10 weeks   PLANNED INTERVENTIONS: Therapeutic exercises, Therapeutic activity, Neuromuscular re-education, Balance training, Gait training, Patient/Family education, Self Care, Joint mobilization, Spinal mobilization, Moist heat, Taping, and Manual therapy, dry needling.   PLAN FOR NEXT SESSION: See clinical impression for plan     GOALS: Goals reviewed with patient? Yes  SHORT TERM GOALS: Target date: 07/20/2023    Pt will demo IND with HEP                    Baseline: Not IND            Goal status: INITIAL   LONG TERM GOALS: Target date: 11/16/2023      1.Pt will demo proper deep core coordination  without chest breathing and optimal excursion of diaphragm/pelvic floor in order to promote spinal stability and pelvic floor function  Baseline: dyscoordination Goal status:  MET   2.  Pt will demo proper body mechanics in against gravity tasks and ADLs  farm work  tasks, fitness  to minimize straining pelvic floor / back  issues  Baseline: not IND, improper form that places strain on pelvic floor  Goal status Ongoing    3. Pt will demo increased gait speed > 1.5 m/s with reciprocal gait pattern, longer stride length  in order to ambulate safely in community and return to fitness routine  Baseline: 1.4 m/s, limited arm swings, limited posterior L thoracic rotation, excessive pelvic sway to L, abducted feet, ,  Goal status: MET ( 09/07/23:  1.58 m/s , reciprocal)      4. Pt will demo levelled pelvic girdle and shoulder height  and to demo equal downward movement of scapula in order to progress to deep core strengthening HEP and restore mobility at spine, pelvis, gait, posture minimize falls, and improve balance   Baseline: R shoulder and R iliac crest lowered , scapular downward movement : L delayed  Goal status: MET    5. Pt will report being able to prep food and wash dishes for > 30 min with < 2/10 thoracic pain and to be able to sit through church / computer > 1.5 hr with < 2/10 LBP  in order to return community and social activities  Baseline:  Sitting  for > 1 hr at computer  and church causes LBP 5-6/10  Standing  at sink and counter with food prep and cooking 10-15 min   cause thoracic pain 6-7/10  Goal status: MET     6. Pt will improve PFDI-7 questionnaire to >10   pts  score change in any categories  to demo improved QOL  Baseline:  125 pts   ( greater pts indicate greater negative impact on QOL)                    38 for UIQ-7 ,  43 for both CRAIQ-7 and POPIQ-7  Goal Status:  MET  ( 09/07/23 :  9 pts, total,  1 pt UIQ-7, 8 pts CRAIQ-7,  2 pt POPQ)    7.  Fecal smearing will  decrease to < 1 x week  after sitting and pt will report no more need for changing pads  Baseline: occurs 3 x week mostly in the evening after sitting  , changes pads 2-3 x day Goal Status: MET ( 09/07/23:  Pt no longer has to take a extra change of clothes and a diaper when traveling because she was afraid she would have a leakage accident with stool and urine.)   8. Pt will be able to make it to the bathroom with sneezing spells and no leakage after sneezing  Baseline: Urinary leakage occurs 100% of the time with  sneezing and coughing and leakage before making to the toilet when sneezing at the sink   Goal STATUS:  MET   9. Pt will demo proper sequential and circumferential coordination of 3 layers of pelvic floor for upward and lengthening to minimize risk for prolapse and relapse of Sx Baseline:' plan to assess internal pelvic floor next session GOAL STATUS: INITIAL    Pia Lupe Plump, PT 09/21/2023, 9:48 AM

## 2023-09-21 NOTE — Patient Instructions (Addendum)
 Exercise check off   Mon Tue Wed [place] Fri  Sat Sun  Quick squeeze   _2  reps             Deep core  level 1  ( 10 breaths)             Quick squeeze   _2  reps ,             Level 2 ( 6 mins)           Quick squeeze   _2  reps ,             Total of 6 sets  2 sets of deep core level 1 and 2   Per day

## 2023-09-28 ENCOUNTER — Ambulatory Visit: Payer: BC Managed Care – PPO | Admitting: Physical Therapy

## 2023-10-05 ENCOUNTER — Ambulatory Visit: Payer: BC Managed Care – PPO | Admitting: Physical Therapy

## 2023-10-12 ENCOUNTER — Ambulatory Visit: Attending: Obstetrics and Gynecology | Admitting: Physical Therapy

## 2023-10-12 ENCOUNTER — Ambulatory Visit: Payer: BC Managed Care – PPO | Admitting: Physical Therapy

## 2023-10-12 DIAGNOSIS — R278 Other lack of coordination: Secondary | ICD-10-CM | POA: Insufficient documentation

## 2023-10-12 DIAGNOSIS — R2689 Other abnormalities of gait and mobility: Secondary | ICD-10-CM | POA: Insufficient documentation

## 2023-10-12 DIAGNOSIS — M533 Sacrococcygeal disorders, not elsewhere classified: Secondary | ICD-10-CM | POA: Diagnosis present

## 2023-10-12 DIAGNOSIS — M546 Pain in thoracic spine: Secondary | ICD-10-CM | POA: Insufficient documentation

## 2023-10-12 DIAGNOSIS — M5459 Other low back pain: Secondary | ICD-10-CM | POA: Diagnosis present

## 2023-10-12 NOTE — Patient Instructions (Addendum)
 QUICK CONTRACTIONS ON YOUR BACK:  KEGEL PROGRAM  Exercise check off  All the exercises on your back in the morning and night     Mon Tue Wed Thurs Fri  Sat Sun  Before deep core level 1:   _3_ reps of quick   pelvic floor squeeze                                   Before deep core level 2 _3_ reps of quick   pelvic floor squeeze                               After  deep core level 2 _3_ reps of quick   pelvic floor squeeze                              QUICK CONTRACTIONS SEATED and LONG HOLDS PROGRESSION ON YOUR BACK:   KEGEL PROGRAM  Exercise check off  All the exercises on your back in the morning and night    Mon Tue Wed Thurs Fri  Sat Sun  Before deep core level 1: __sec hold,  aloud 2 rest breath,  _3_ reps                          Before deep core level 2 __ sec hold, aloud 2 rest breath,  _3_ reps            After  deep core level 2 _ sec hold, aloud 2 rest breath,  __3 reps           TOTAL of 6 sets of the endurance kegels  if you the above routine in the m orning and night  During breakfast, lunch, dinner  SEATED    Quick pelvic floor contractions  _3  reps             TOTAL of 3 sets of the quick kegels if you do them seated with meals        __   Multifidis twist   Band is on doorknob: standing facing perpendicular to door , sit halfway towards front of chair, firm through 4 points of contact at buttocks and feet. Feet are placed hip with apart.   Twisting trunk without moving the hips and knees Hold band at the level of ribcage, elbows bent,shoulder blades roll back and down like squeezing a pencil under armpit   Exhale twist,.10-15 deg away from door without moving your hips/ knees, press more weight on the side of the sitting bones/ foot opp of your direction of turn as your counterweight. Continue to maintain equal weight through legs.  Keep knee unlocked.  30 reps  __  Minisquat: Scoot buttocks back slight, hinge like you are  looking at your reflection on a pond  Knees behind toes,  Inhale to smell flowers  Exhale on the rise like rocket  Do not lock knees, have more weight across ballmounds of feet, toes relaxed and spread them, not grip them   10 reps x 3 x day

## 2023-10-12 NOTE — Therapy (Addendum)
 OUTPATIENT PHYSICAL THERAPY TREATMENT     Patient Name: Nancy Freeman MRN: [id number] DOB:05-Aug-1970, 53 y.o., female Today's Date: 10/12/2023   PT End of Session - 10/12/23 1041     Visit Number 11    Number of Visits 18    Date for PT Re-Evaluation 11/16/23    PT Start Time 1020    PT Stop Time 1105    PT Time Calculation (min) 45 min    Activity Tolerance Patient tolerated treatment well;No increased pain    Behavior During Therapy [facility] for tasks assessed/performed          Past Medical History:  Diagnosis Date   ADHD (attention deficit hyperactivity disorder)    ADHD   Allergy    Anginal pain (HCC)    Normal TTE and myocardial perfusion scan 04/24/20   Anxiety    BRCA negative 08/2019   MyRisk neg   Family history of breast cancer 08/2019   IBIS=14.3%/riskscore=14.9%   Family history of ovarian cancer    GERD (gastroesophageal reflux disease)    Headache    migraines  1-2x/month   Hidradenoma VULVAR   Hypertension    IBS (irritable bowel syndrome)    Internal hemorrhoid    Past Surgical History:  Procedure Laterality Date   ABDOMINAL HYSTERECTOMY  01-19-2001  DR CARY   W/ LEFT SALPINGO-OOPHORECTOMY AND EXCISION ENDOMETRIOSIS   BICEPT TENODESIS Right 03/20/2022   Procedure: OPEN BICEPS TENODESIS;  Surgeon: Addie Cordella Hamilton, MD;  Location: [facility];  Service: Orthopedics;  Laterality: Right;   BREAST BIOPSY Left 09/22/2017   affirm bx, pash   CESAREAN SECTION     COLONOSCOPY     COLONOSCOPY WITH PROPOFOL  N/A 05/10/2020   Procedure: COLONOSCOPY WITH PROPOFOL ;  Surgeon: Jinny Carmine, MD;  Location: [facility];  Service: Endoscopy;  Laterality: N/A;   ESOPHAGOGASTRODUODENOSCOPY (EGD) WITH PROPOFOL  N/A 05/10/2020   Procedure: ESOPHAGOGASTRODUODENOSCOPY (EGD) WITH PROPOFOL ;  Surgeon: Jinny Carmine, MD;  Location: [facility];  Service: Endoscopy;  Laterality: N/A;   HEMORRHOID BANDING  2018   POLYPECTOMY N/A 05/10/2020    Procedure: POLYPECTOMY;  Surgeon: Jinny Carmine, MD;  Location: [facility];  Service: Endoscopy;  Laterality: N/A;   REMOVAL OF SIDE PLATE, DISTAL FIBULA, RIGHT ANKLE  05-08-2004   SHOULDER ARTHROSCOPY Right 09/03/2022   Procedure: RIGHT SHOULDER ARTHROSCOPY WITH ROTATOR INTERVAL RELEASE;  Surgeon: Addie Cordella Hamilton, MD;  Location: [facility];  Service: Orthopedics;  Laterality: Right;   SHOULDER ARTHROSCOPY WITH OPEN ROTATOR CUFF REPAIR AND DISTAL CLAVICLE ACROMINECTOMY Right 03/20/2022   Procedure: RIGHT SHOULDER ARTHROSCOPY, POSTERIOR LABRAL REPAIR, DEBRIDEMENT, ARTHROSCOPIC DISTAL CLAVICLE EXCISION,;  Surgeon: Addie Cordella Hamilton, MD;  Location: [facility];  Service: Orthopedics;  Laterality: Right;   SHOULDER CLOSED REDUCTION Right 09/03/2022   Procedure: MANIPULATION UNDER ANESTHESIA;  Surgeon: Addie Cordella Hamilton, MD;  Location: [facility];  Service: Orthopedics;  Laterality: Right;   UPPER GASTROINTESTINAL ENDOSCOPY     Patient Active Problem List   Diagnosis Date Noted   Incontinence of feces 11/24/2022   Urinary incontinence 11/24/2022   Adhesive capsulitis of right shoulder 09/19/2022   Arthritis of right acromioclavicular joint 03/22/2022   Degenerative superior labral anterior-to-posterior (SLAP) tear of right shoulder 03/22/2022   Biceps tendonitis on right 03/22/2022   Tear of right glenoid labrum 03/22/2022   Chronic right shoulder pain 01/19/2022   History of colonic polyps    Polyp of sigmoid colon    Gastroesophageal reflux disease without esophagitis  BRCA negative 10/13/2019   Family history of ovarian cancer 09/18/2019   Prolapsed internal hemorrhoids, grade 2 05/19/2016   PCP: [facility]   REFERRING PROVIDER: Zuleta,   REFERRING DIAG:  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  R15.1 (ICD-10-CM) - Fecal smearing  N39.41 (ICD-10-CM) - Urge incontinence of urine  R35.0 (ICD-10-CM) - Urinary frequency  N32.81 (ICD-10-CM) - OAB (overactive  bladder)  M62.89 (ICD-10-CM) - Pelvic floor dysfunction  M62.838 (ICD-10-CM) - Levator spasm    Rationale for Evaluation and Treatment Rehabilitation  THERAPY DIAG:  Sacrococcygeal disorders, not elsewhere classified  Pain in thoracic spine  Other low back pain  Other lack of coordination  Other abnormalities of gait and mobility  ONSET DATE:   SUBJECTIVE:                SUBJECTIVE STATEMENT TODAY:                                                           Pt has reported she has been trying to catch herself not bringing her knees today . Pt noticed sharpness and jabs in her feet. She felt fine after internal pelvic exam and Tx and had no pain nor soreness  SUBJECTIVE STATEMENT ON EVAL   06/22/23:  1_ fecal smearing:  occurs 3 x week mostly in the evening after sitting  , changes pads 2-3 x day . Pt has noticed a decreased in fecal leakage after changing to a Mediterre[place] Diet and removed caffeine for the past 4 months. Pt also acupuncture visits for TMJ and LBP for incontinence issues for 16 visits from Feb - April with Dr. Ila ( chiropractor).  Prior to these Tx and dietary changes , pt was having fecal leakage during the night and during the day. And going thru 3-5 pads. Daily fluid : 96 fl oz of water  and no alcohol/ coffee. Juice only 2 x week . Denied straining with bowel movements, Uses a bidet.  Daily BMS , once a day mostly with Pt has stool consistency Type 4-5 .   Pt gets rectal spasms at anal sphincter after sitting for long periods like recent car trip. Pt used get these rectal spasms in the past 6 month before she changed her diet   2_LBP non radiating pain:    Sitting for > 1 hr at computer  and church causes LBP 5-6/10 . Pt had this LBP for past 15-20 years.   3_ thoracic pain without radiating pain : Standing  at sink and counter with food prep and cooking 10-15 min   cause thoracic pain 6-7/10 . Pt had this thoracic pain for last 10 years   4.  Urinary leakage  occurs sneezing and coughing.    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 See above   PRECAUTIONS: None  WEIGHT BEARING RESTRICTIONS: No  FALLS:  Has patient fallen in last 6 months? 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, feeding animals with hay, mending fences     OBJECTIVE:    [facility] PT Assessment - 10/12/23 1104       Squat   Comments increased lumbar lordosis      Other:   Other/ Comments dissaociation poor coordination between thorax/ LKC      Other:   Other/Comments simulated lifting and holding a heavy farm object : poor scapular stabilization           Pelvic Floor Special Questions - 10/12/23 1048     Pelvic Floor Internal Exam pt consented verbally, without contraindications    Exam Type Vaginal    Palpation no mm tightness , long holds for 3 sec, 3 reps before , upward position of urethra and bladder in normal position           [facility] Adult PT Treatment/Exercise - 10/12/23 1105       Therapeutic Activites    Other Therapeutic Activities explained integrating short and long kegels in to HEP, explained the role of multidifis coordination to help wih functional tasks, cued for proper squats with gaze on floor for less lumbar lordosis      Neuro Re-ed    Neuro Re-ed Details  execssive cues for multidifis. disassocation of trunk / LKC,  excessive cues for less lumbar lorodosis with tactile cues for squats , cued for seated kegels and long kegels in hooklying, cued for scapular  stabilization when lifting and holding farm tool           HOME EXERCISE PROGRAM: See pt instruction section    ASSESSMENT:  CLINICAL IMPRESSION:  Pt has met 8/9 goals and progressing well towards remaining goals.  Improvements across the past sessions:   Pt has had no fecal smearing for the past 2 months since starting PT. Pt no longer has to take a extra change of clothes and a diaper when traveling because she was afraid she would have a leakage accident with stool and urine. I feel ike I have my life back. Pt had left work end of March due to these issues because it was so disruptive to her work to the point where she had accidents on zoom calls.  Pt no longer plans her travel routes, plan her food to avoid fecal and urinary accidents. I am past that.   Pt shows corrected alignment of pelvis and spine with levelled shoulders and iliac crest.  LBP and midback have resolved and able to sit for > 30 min at computer and at church.  Pt is able to cook and prep food without thoracic pain for 1.5 hours compared to pain occurring after 10-15 min Pt is able to sit at church and while driving without LBP the past 5 weeks.  Improved PIFQ-7 scores, indication functional improvements            Anticipate these improvements will help minimize pelvic floor overactivity and  further improve gait and fecal smearing and urinary incontinence through the use of regional interdependence approach.     Today progressed long kegel contractions in hooklying and seated short contraction for complete kegel contraction training.  Pt demo'd correct technique .     Provided excessive cues for multifidus strengthening to promote propioception for disassocation of trunk / LKC,  provided excessive cues for less lumbar lorodosis with tactile cues for squats .  Cued for  stabilization cervicoscapular stabilization for lifting and holding weight during farming tasks. Pt demo'd correctly post Tx.   Regional  interdependent approaches will yield greater benefits .     Pt benefits from skilled PT.     OBJECTIVE IMPAIRMENTS decreased activity tolerance, decreased coordination, decreased endurance, decreased mobility, difficulty walking, decreased ROM, decreased strength, decreased safety awareness, hypomobility, increased muscle spasms, impaired flexibility, improper body mechanics, postural dysfunction, and pain. scar restrictions   ACTIVITY LIMITATIONS  self-care,  home chores, work tasks    PARTICIPATION LIMITATIONS:  community, farming activities and Recruitment consultant activities    PERSONAL FACTORS   see pertinent Hx with surgeries above /  post retiurement plans to take for farm with strenuous physical activities are also affecting patient's functional outcome.    REHAB POTENTIAL: Good   CLINICAL DECISION MAKING: Evolving/moderate complexity   EVALUATION COMPLEXITY: Moderate    PATIENT EDUCATION:    Education details: Showed pt anatomy images. Explained muscles attachments/ connection, physiology of deep core system/ spinal- thoracic-pelvis-lower kinetic chain as they relate to pt's presentation, Sx, and past Hx. Explained what and how these areas of deficits need to be restored to balance and function    See Therapeutic activity / neuromuscular re-education section  Answered pt's questions.   Person educated: Patient Education method: Explanation, Demonstration, Tactile cues, Verbal cues, and Handouts Education comprehension: verbalized understanding, returned demonstration, verbal cues required, tactile cues required, and needs further education     PLAN: PT FREQUENCY: 1x/week   PT DURATION: 10 weeks   PLANNED INTERVENTIONS: Therapeutic exercises, Therapeutic activity, Neuromuscular re-education, Balance training, Gait training, Patient/Family education, Self Care, Joint mobilization, Spinal mobilization, Moist heat, Taping, and Manual therapy, dry needling.   PLAN FOR NEXT  SESSION: See clinical impression for plan     GOALS: Goals reviewed with patient? Yes  SHORT TERM GOALS: Target date: 07/20/2023    Pt will demo IND with HEP                    Baseline: Not IND            Goal status: INITIAL   LONG TERM GOALS: Target date: 11/16/2023      1.Pt will demo proper deep core coordination without chest breathing and optimal excursion of diaphragm/pelvic floor in order to promote spinal stability and pelvic floor function  Baseline: dyscoordination Goal status:  MET   2.  Pt will demo proper body mechanics in against gravity tasks and ADLs  farm work  tasks, fitness  to minimize straining pelvic floor / back  issues  Baseline: not IND, improper form that places strain on pelvic floor  Goal status Ongoing    3. Pt will demo increased gait speed > 1.5 m/s with reciprocal gait pattern, longer stride length  in order to ambulate safely in community and return to fitness routine  Baseline: 1.4 m/s, limited arm swings, limited posterior L thoracic rotation, excessive pelvic sway to L, abducted feet, ,  Goal status: MET ( 09/07/23:  1.58 m/s , reciprocal)      4. Pt will demo levelled pelvic girdle and shoulder height  and to demo equal downward movement of scapula in order to progress to deep core strengthening HEP and restore mobility at spine, pelvis, gait, posture minimize falls, and improve balance   Baseline: R shoulder and R iliac crest lowered , scapular downward movement : L delayed  Goal status: MET    5. Pt will report being able to  prep food and wash dishes for > 30 min with < 2/10 thoracic pain and to be able to sit through church / computer > 1.5 hr with < 2/10 LBP  in order to return community and social activities  Baseline:  Sitting for > 1 hr at computer  and church causes LBP 5-6/10  Standing  at sink and counter with food prep and cooking 10-15 min   cause thoracic pain 6-7/10  Goal status: MET     6. Pt will improve PFDI-7  questionnaire to >10   pts  score change in any categories  to demo improved QOL  Baseline:  125 pts   ( greater pts indicate greater negative impact on QOL)                    38 for UIQ-7 ,  43 for both CRAIQ-7 and POPIQ-7  Goal Status:  MET  ( 09/07/23 :  9 pts, total,  1 pt UIQ-7, 8 pts CRAIQ-7,  2 pt POPQ)    7.  Fecal smearing will decrease to < 1 x week  after sitting and pt will report no more need for changing pads  Baseline: occurs 3 x week mostly in the evening after sitting  , changes pads 2-3 x day Goal Status: MET ( 09/07/23:  Pt no longer has to take a extra change of clothes and a diaper when traveling because she was afraid she would have a leakage accident with stool and urine.)   8. Pt will be able to make it to the bathroom with sneezing spells and no leakage after sneezing  Baseline: Urinary leakage occurs 100% of the time with  sneezing and coughing and leakage before making to the toilet when sneezing at the sink   Goal STATUS:  MET   9. Pt will demo proper sequential and circumferential coordination of 3 layers of pelvic floor for upward and lengthening to minimize risk for prolapse and relapse of Sx Baseline:' plan to assess internal pelvic floor next session GOAL STATUS: INITIAL    Pia Lupe Plump, PT 10/12/2023, 2:11 PM

## 2023-10-19 ENCOUNTER — Ambulatory Visit: Payer: BC Managed Care – PPO | Admitting: Physical Therapy

## 2023-10-19 DIAGNOSIS — M533 Sacrococcygeal disorders, not elsewhere classified: Secondary | ICD-10-CM

## 2023-10-19 DIAGNOSIS — R278 Other lack of coordination: Secondary | ICD-10-CM

## 2023-10-19 DIAGNOSIS — M5459 Other low back pain: Secondary | ICD-10-CM

## 2023-10-19 DIAGNOSIS — R2689 Other abnormalities of gait and mobility: Secondary | ICD-10-CM

## 2023-10-19 DIAGNOSIS — M546 Pain in thoracic spine: Secondary | ICD-10-CM

## 2023-10-19 NOTE — Therapy (Unsigned)
 OUTPATIENT PHYSICAL THERAPY TREATMENT   / Discharge Summary across 12 visits    Patient Name: Nancy Freeman MRN: [id number] DOB:09-06-1970, 53 y.o., female Today's Date: 10/19/2023   PT End of Session - 10/19/23 1029     Visit Number 12    Number of Visits 18    Date for PT Re-Evaluation 11/16/23    Authorization Type 3 PT vsts from 7/29-8/27 [employer] 2025    PT Start Time 1020    PT Stop Time 1100    PT Time Calculation (min) 40 min    Activity Tolerance Patient tolerated treatment well;No increased pain    Behavior During Therapy [facility] for tasks assessed/performed          Past Medical History:  Diagnosis Date   ADHD (attention deficit hyperactivity disorder)    ADHD   Allergy    Anginal pain (HCC)    Normal TTE and myocardial perfusion scan 04/24/20   Anxiety    BRCA negative 08/2019   MyRisk neg   Family history of breast cancer 08/2019   IBIS=14.3%/riskscore=14.9%   Family history of ovarian cancer    GERD (gastroesophageal reflux disease)    Headache    migraines  1-2x/month   Hidradenoma VULVAR   Hypertension    IBS (irritable bowel syndrome)    Internal hemorrhoid    Past Surgical History:  Procedure Laterality Date   ABDOMINAL HYSTERECTOMY  01-19-2001  DR CARY   W/ LEFT SALPINGO-OOPHORECTOMY AND EXCISION ENDOMETRIOSIS   BICEPT TENODESIS Right 03/20/2022   Procedure: OPEN BICEPS TENODESIS;  Surgeon: Addie Cordella Hamilton, MD;  Location: [facility];  Service: Orthopedics;  Laterality: Right;   BREAST BIOPSY Left 09/22/2017   affirm bx, pash   CESAREAN SECTION     COLONOSCOPY     COLONOSCOPY WITH PROPOFOL  N/A 05/10/2020   Procedure: COLONOSCOPY WITH PROPOFOL ;  Surgeon: Jinny Carmine, MD;  Location: [facility];  Service: Endoscopy;  Laterality: N/A;   ESOPHAGOGASTRODUODENOSCOPY (EGD) WITH PROPOFOL  N/A 05/10/2020   Procedure: ESOPHAGOGASTRODUODENOSCOPY (EGD) WITH PROPOFOL ;  Surgeon: Jinny Carmine, MD;  Location: [facility];   Service: Endoscopy;  Laterality: N/A;   HEMORRHOID BANDING  2018   POLYPECTOMY N/A 05/10/2020   Procedure: POLYPECTOMY;  Surgeon: Jinny Carmine, MD;  Location: [facility];  Service: Endoscopy;  Laterality: N/A;   REMOVAL OF SIDE PLATE, DISTAL FIBULA, RIGHT ANKLE  05-08-2004   SHOULDER ARTHROSCOPY Right 09/03/2022   Procedure: RIGHT SHOULDER ARTHROSCOPY WITH ROTATOR INTERVAL RELEASE;  Surgeon: Addie Cordella Hamilton, MD;  Location: [facility];  Service: Orthopedics;  Laterality: Right;   SHOULDER ARTHROSCOPY WITH OPEN ROTATOR CUFF REPAIR AND DISTAL CLAVICLE ACROMINECTOMY Right 03/20/2022   Procedure: RIGHT SHOULDER ARTHROSCOPY, POSTERIOR LABRAL REPAIR, DEBRIDEMENT, ARTHROSCOPIC DISTAL CLAVICLE EXCISION,;  Surgeon: Addie Cordella Hamilton, MD;  Location:  SURGERY CENTER;  Service: Orthopedics;  Laterality: Right;   SHOULDER CLOSED REDUCTION Right 09/03/2022   Procedure: MANIPULATION UNDER ANESTHESIA;  Surgeon: Addie Cordella Hamilton, MD;  Location: [facility] OR;  Service: Orthopedics;  Laterality: Right;   UPPER GASTROINTESTINAL ENDOSCOPY     Patient Active Problem List   Diagnosis Date Noted   Incontinence of feces 11/24/2022   Urinary incontinence 11/24/2022   Adhesive capsulitis of right shoulder 09/19/2022   Arthritis of right acromioclavicular joint 03/22/2022   Degenerative superior labral anterior-to-posterior (SLAP) tear of right shoulder 03/22/2022   Biceps tendonitis on right 03/22/2022   Tear of right glenoid labrum 03/22/2022   Chronic right shoulder pain 01/19/2022  History of colonic polyps    Polyp of sigmoid colon    Gastroesophageal reflux disease without esophagitis    BRCA negative 10/13/2019   Family history of ovarian cancer 09/18/2019   Prolapsed internal hemorrhoids, grade 2 05/19/2016   PCP: [facility]   REFERRING PROVIDER: Zuleta,   REFERRING DIAG:  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  R15.1 (ICD-10-CM) - Fecal smearing  N39.41 (ICD-10-CM) - Urge  incontinence of urine  R35.0 (ICD-10-CM) - Urinary frequency  N32.81 (ICD-10-CM) - OAB (overactive bladder)  M62.89 (ICD-10-CM) - Pelvic floor dysfunction  M62.838 (ICD-10-CM) - Levator spasm    Rationale for Evaluation and Treatment Rehabilitation  THERAPY DIAG:  Sacrococcygeal disorders, not elsewhere classified  Pain in thoracic spine  Other low back pain  Other lack of coordination  Other abnormalities of gait and mobility  ONSET DATE:   SUBJECTIVE:                SUBJECTIVE STATEMENT TODAY:                                                           Pt has practiced last week's exercise and also notice not standing with weight shifted to one leg.    SUBJECTIVE STATEMENT ON EVAL   06/22/23:  1_ fecal smearing:  occurs 3 x week mostly in the evening after sitting  , changes pads 2-3 x day . Pt has noticed a decreased in fecal leakage after changing to a Mediter[place] Diet and removed caffeine for the past 4 months. Pt also acupuncture visits for TMJ and LBP for incontinence issues for 16 visits from Feb - April with Dr. Ila ( chiropractor).  Prior to these Tx and dietary changes , pt was having fecal leakage during the night and during the day. And going thru 3-5 pads. Daily fluid : 96 fl oz of water  and no alcohol/ coffee. Juice only 2 x week . Denied straining with bowel movements, Uses a bidet.  Daily BMS , once a day mostly with Pt has stool consistency Type 4-5 .   Pt gets rectal spasms at anal sphincter after sitting for long periods like recent car trip. Pt used get these rectal spasms in the past 6 month before she changed her diet   2_LBP non radiating pain:    Sitting for > 1 hr at computer  and church causes LBP 5-6/10 . Pt had this LBP for past 15-20 years.   3_ thoracic pain without radiating pain : Standing  at sink and counter with food prep and cooking 10-15 min   cause thoracic pain 6-7/10 . Pt had this thoracic pain for last 10 years   4.  Urinary leakage  occurs sneezing and coughing.    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 See above   PRECAUTIONS: None  WEIGHT BEARING RESTRICTIONS: No  FALLS:  Has patient fallen in last 6 months? 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    Improvements across the past sessions:   Pt has had no fecal smearing for the past 2 months since starting PT. Pt no longer has to take a extra change of clothes and a diaper when traveling because she was afraid she would have a leakage accident with stool and urine. I feel ike I have my life back. Pt had left work end of March due to these issues because it was so disruptive to her work to the point where she had accidents on zoom calls.  Pt no longer plans her travel routes, plan her food to avoid fecal and urinary accidents. I am past that.   Pt shows corrected alignment of pelvis and spine with levelled shoulders and iliac crest.  LBP and midback have resolved and able to sit for > 30 min at computer and at church.  Pt is able to cook and prep food without thoracic pain for 1.5 hours compared to pain occurring after 10-15 min Pt is able to sit at church and while driving without LBP the past 5 weeks.  Improved PIFQ-7 scores, indication functional improvements             Anticipate these improvements will help minimize pelvic floor overactivity and  further improve gait and fecal smearing and urinary incontinence through the use of regional interdependence approach.     Today progressed long kegel contractions in hooklying and seated short contraction for complete kegel contraction training.  Pt demo'd correct technique .     Provided excessive cues for multifidus strengthening to promote propioception for disassocation of trunk / LKC,  provided excessive cues for less lumbar lorodosis with tactile cues for squats .  Cued for stabilization cervicoscapular stabilization for lifting and holding weight during farming tasks. Pt demo'd correctly post Tx.   Regional interdependent approaches will yield greater benefits .     Pt benefits from skilled PT.     OBJECTIVE IMPAIRMENTS decreased activity tolerance, decreased coordination, decreased endurance, decreased mobility, difficulty walking, decreased ROM, decreased strength, decreased safety awareness, hypomobility, increased muscle spasms, impaired flexibility, improper body mechanics, postural dysfunction, and pain. scar restrictions   ACTIVITY LIMITATIONS  self-care,  home chores, work tasks    PARTICIPATION LIMITATIONS:  community, farming activities and Recruitment consultant activities    PERSONAL FACTORS   see pertinent Hx with surgeries above /  post retiurement plans to take for farm with strenuous physical activities are also affecting patient's functional outcome.    REHAB POTENTIAL: Good   CLINICAL DECISION MAKING: Evolving/moderate complexity   EVALUATION COMPLEXITY: Moderate    PATIENT EDUCATION:    Education details: Showed pt anatomy images. Explained muscles attachments/ connection, physiology of deep core system/ spinal- thoracic-pelvis-lower kinetic chain as they relate to pt's presentation, Sx, and past Hx. Explained what and how these areas of deficits need to be restored to balance and  function    See Therapeutic activity / neuromuscular re-education section  Answered pt's questions.   Person educated: Patient Education method: Explanation, Demonstration, Tactile cues, Verbal cues, and Handouts Education comprehension: verbalized understanding, returned demonstration, verbal cues  required, tactile cues required, and needs further education     PLAN: PT FREQUENCY: 1x/week   PT DURATION: 10 weeks   PLANNED INTERVENTIONS: Therapeutic exercises, Therapeutic activity, Neuromuscular re-education, Balance training, Gait training, Patient/Family education, Self Care, Joint mobilization, Spinal mobilization, Moist heat, Taping, and Manual therapy, dry needling.   PLAN FOR NEXT SESSION: See clinical impression for plan     GOALS: Goals reviewed with patient? Yes  SHORT TERM GOALS: Target date: 07/20/2023    Pt will demo IND with HEP                    Baseline: Not IND            Goal status: INITIAL   LONG TERM GOALS: Target date: 11/16/2023      1.Pt will demo proper deep core coordination without chest breathing and optimal excursion of diaphragm/pelvic floor in order to promote spinal stability and pelvic floor function  Baseline: dyscoordination Goal status:  MET   2.  Pt will demo proper body mechanics in against gravity tasks and ADLs  farm work  tasks, fitness  to minimize straining pelvic floor / back  issues  Baseline: not IND, improper form that places strain on pelvic floor  Goal status MET    3. Pt will demo increased gait speed > 1.5 m/s with reciprocal gait pattern, longer stride length  in order to ambulate safely in community and return to fitness routine  Baseline: 1.4 m/s, limited arm swings, limited posterior L thoracic rotation, excessive pelvic sway to L, abducted feet, ,  Goal status: MET ( 09/07/23:  1.58 m/s , reciprocal)      4. Pt will demo levelled pelvic girdle and shoulder height  and to demo equal downward movement of scapula  in order to progress to deep core strengthening HEP and restore mobility at spine, pelvis, gait, posture minimize falls, and improve balance   Baseline: R shoulder and R iliac crest lowered , scapular downward movement : L delayed  Goal status: MET    5. Pt will report being able to prep food and wash dishes for > 30 min with < 2/10 thoracic pain and to be able to sit through church / computer > 1.5 hr with < 2/10 LBP  in order to return community and social activities  Baseline:  Sitting for > 1 hr at computer  and church causes LBP 5-6/10  Standing  at sink and counter with food prep and cooking 10-15 min   cause thoracic pain 6-7/10  Goal status: MET     6. Pt will improve PFDI-7 questionnaire to >10   pts  score change in any categories  to demo improved QOL  Baseline:  125 pts   ( greater pts indicate greater negative impact on QOL)                    38 for UIQ-7 ,  43 for both CRAIQ-7 and POPIQ-7  Goal Status:  MET  ( 09/07/23 :  9 pts, total,  1 pt UIQ-7, 8 pts CRAIQ-7,  2 pt POPQ)    7.  Fecal smearing will decrease to < 1 x week  after sitting and pt will report no more need for changing pads  Baseline: occurs 3 x week mostly in the evening after sitting  , changes pads 2-3 x day Goal Status: MET ( 09/07/23:  Pt no longer has to take a extra change of clothes  and a diaper when traveling because she was afraid she would have a leakage accident with stool and urine.)   8. Pt will be able to make it to the bathroom with sneezing spells and no leakage after sneezing  Baseline: Urinary leakage occurs 100% of the time with  sneezing and coughing and leakage before making to the toilet when sneezing at the sink   Goal STATUS:  MET   9. Pt will demo proper sequential and circumferential coordination of 3 layers of pelvic floor for upward and lengthening to minimize risk for prolapse and relapse of Sx Baseline:' plan to assess internal pelvic floor next session GOAL STATUS: MET     Pia Lupe Plump, PT 10/19/2023, 11:02 AM

## 2023-10-21 ENCOUNTER — Encounter: Payer: Self-pay | Admitting: Obstetrics and Gynecology

## 2023-10-26 ENCOUNTER — Ambulatory Visit: Payer: BC Managed Care – PPO | Admitting: Physical Therapy

## 2023-10-26 ENCOUNTER — Encounter: Payer: Self-pay | Admitting: Sports Medicine

## 2023-11-02 ENCOUNTER — Ambulatory Visit: Payer: BC Managed Care – PPO | Admitting: Physical Therapy

## 2024-03-06 ENCOUNTER — Encounter: Payer: Self-pay | Admitting: *Deleted
